# Patient Record
Sex: Female | Born: 1940 | Race: White | Hispanic: No | Smoking: Former smoker
Health system: Southern US, Community
[De-identification: ages and names within clinical notes are randomized; demographics above are authoritative.]

## PROBLEM LIST (undated history)

## (undated) DIAGNOSIS — F329 Major depressive disorder, single episode, unspecified: Secondary | ICD-10-CM

## (undated) DIAGNOSIS — M1990 Unspecified osteoarthritis, unspecified site: Secondary | ICD-10-CM

## (undated) DIAGNOSIS — H269 Unspecified cataract: Secondary | ICD-10-CM

## (undated) DIAGNOSIS — E079 Disorder of thyroid, unspecified: Secondary | ICD-10-CM

## (undated) DIAGNOSIS — J449 Chronic obstructive pulmonary disease, unspecified: Secondary | ICD-10-CM

## (undated) DIAGNOSIS — M810 Age-related osteoporosis without current pathological fracture: Secondary | ICD-10-CM

## (undated) DIAGNOSIS — F419 Anxiety disorder, unspecified: Secondary | ICD-10-CM

## (undated) DIAGNOSIS — J439 Emphysema, unspecified: Secondary | ICD-10-CM

## (undated) DIAGNOSIS — F32A Depression, unspecified: Secondary | ICD-10-CM

## (undated) DIAGNOSIS — Z87440 Personal history of urinary (tract) infections: Secondary | ICD-10-CM

## (undated) HISTORY — DX: Unspecified cataract: H26.9

## (undated) HISTORY — DX: Unspecified osteoarthritis, unspecified site: M19.90

## (undated) HISTORY — DX: Disorder of thyroid, unspecified: E07.9

## (undated) HISTORY — DX: Age-related osteoporosis without current pathological fracture: M81.0

## (undated) HISTORY — DX: Personal history of urinary (tract) infections: Z87.440

## (undated) HISTORY — DX: Major depressive disorder, single episode, unspecified: F32.9

## (undated) HISTORY — DX: Emphysema, unspecified: J43.9

## (undated) HISTORY — DX: Chronic obstructive pulmonary disease, unspecified: J44.9

## (undated) HISTORY — PX: ABDOMINAL HYSTERECTOMY: SHX81

## (undated) HISTORY — DX: Depression, unspecified: F32.A

## (undated) HISTORY — DX: Anxiety disorder, unspecified: F41.9

---

## 2010-09-23 ENCOUNTER — Encounter: Payer: Self-pay | Admitting: Cardiovascular Disease

## 2010-09-30 DIAGNOSIS — J45909 Unspecified asthma, uncomplicated: Secondary | ICD-10-CM | POA: Insufficient documentation

## 2010-09-30 DIAGNOSIS — R079 Chest pain, unspecified: Secondary | ICD-10-CM | POA: Insufficient documentation

## 2010-09-30 DIAGNOSIS — J40 Bronchitis, not specified as acute or chronic: Secondary | ICD-10-CM | POA: Insufficient documentation

## 2010-09-30 DIAGNOSIS — K649 Unspecified hemorrhoids: Secondary | ICD-10-CM | POA: Insufficient documentation

## 2010-09-30 DIAGNOSIS — F32A Depression, unspecified: Secondary | ICD-10-CM | POA: Insufficient documentation

## 2010-09-30 DIAGNOSIS — F419 Anxiety disorder, unspecified: Secondary | ICD-10-CM | POA: Insufficient documentation

## 2010-09-30 DIAGNOSIS — Z8639 Personal history of other endocrine, nutritional and metabolic disease: Secondary | ICD-10-CM | POA: Insufficient documentation

## 2010-09-30 DIAGNOSIS — J439 Emphysema, unspecified: Secondary | ICD-10-CM | POA: Insufficient documentation

## 2010-09-30 DIAGNOSIS — R0602 Shortness of breath: Secondary | ICD-10-CM | POA: Insufficient documentation

## 2010-09-30 DIAGNOSIS — F329 Major depressive disorder, single episode, unspecified: Secondary | ICD-10-CM | POA: Insufficient documentation

## 2010-09-30 DIAGNOSIS — M810 Age-related osteoporosis without current pathological fracture: Secondary | ICD-10-CM | POA: Insufficient documentation

## 2010-10-01 ENCOUNTER — Encounter: Payer: Self-pay | Admitting: Cardiovascular Disease

## 2010-10-01 ENCOUNTER — Ambulatory Visit
Admission: RE | Admit: 2010-10-01 | Discharge: 2010-10-01 | Payer: Self-pay | Source: Home / Self Care | Attending: Cardiovascular Disease | Admitting: Cardiovascular Disease

## 2010-10-01 DIAGNOSIS — I491 Atrial premature depolarization: Secondary | ICD-10-CM | POA: Insufficient documentation

## 2010-10-05 ENCOUNTER — Ambulatory Visit: Payer: Self-pay | Admitting: Cardiology

## 2010-10-15 NOTE — Letter (Signed)
Summary: [facility]  [facility]   Imported By: Marylou Mccoy 09/30/2010 14:41:49  _____________________________________________________________________  External Attachment:    Type:   Image     Comment:   External Document

## 2010-10-15 NOTE — Assessment & Plan Note (Signed)
Summary: chest pain/sob/copd/mt   CC:  sob.  History of Present Illness: Rachel Perez is referred today by urgent care for SOB , PAC;s and chest pressure.  She is a long term smoker and clinically has severe COPD.  She indicates some sort of lung test at Dr Doolittle's office last week that indicated mod-upper level disease.  Chest pressure sounds more like congestion from COPD/URI and impoved with Zpack.  Chronic cough.  Complains of LE pain and has not been dancing for 2-3 years.  No previous CAD, PVD or known vascular disease.  Very limited in activity by exertinal dyspnea.  2 episodes recently of very bad dyspnea associated with pressure and anxiety.  No resting dsypnea or pressure  Current Problems (verified): 1)  Chest Pain  (ICD-786.50) 2)  Hemorrhoids  (ICD-455.6) 3)  Thyroid Disorder  (ICD-246.9) 4)  Asthma  (ICD-493.90) 5)  Bronchitis  (ICD-490) 6)  Depression  (ICD-311) 7)  Anxiety  (ICD-300.00) 8)  Osteoporosis  (ICD-733.00) 9)  COPD  (ICD-496) 10)  Shortness of Breath  (ICD-786.05)  Current Medications (verified): 1)  Calcium 500 Mg Tabs (Calcium) .Marland Kitchen.. 1 Tab By Mouth Three Times A Day 2)  Spiriva Handihaler 18 Mcg Caps (Tiotropium Bromide Monohydrate) .... As Directed 3)  Advair Diskus 500-50 Mcg/dose Aepb (Fluticasone-Salmeterol) .... As Directed 4)  Alprazolam .... As Needed 5)  Wellbutrin Xl 300 Mg Xr24h-Tab (Bupropion Hcl) .Marland Kitchen.. 1 Tab By Mouth Once Daily 6)  Vitamin D 2000 Unit Tabs (Cholecalciferol) .Marland Kitchen.. 1 Tab By Mouth Once Daily 7)  Fluticasone Propionate 50 Mcg/act Susp (Fluticasone Propionate) .... As Directed 8)  Proair Hfa 108 (90 Base) Mcg/act Aers (Albuterol Sulfate) .... As Directed 9)  Fosamax 40 Mg Tabs (Alendronate Sodium) .Marland Kitchen.. 1 Tab By Mouth Weekly 10)  Vitamin D (Ergocalciferol) 50000 Unit Caps (Ergocalciferol) .... Weekly 11)  Mucinex .... As Needed  Allergies (verified): 1)  ! Pcn  Past History:  Past Medical History: Last updated: 09/30/2010 CHEST  PAIN  HEMORRHOIDS  THYROID DISORDER ASTHMA0 BRONCHITIS  DEPRESSION ANXIETY OSTEOPOROSIS COPD SHORTNESS OF BREATH  Past Surgical History: Last updated: 09/30/2010 Tonsilis/ Adenoids  Hysterctomy  Family History: Last updated: 09/30/2010 asthma stroke  Social History: Last updated: 09/30/2010 Divorced  Tobacco Use - No.  Alcohol Use - yes Regular Exercise - yes Drug Use - no  Review of Systems       Denies fever, malais, weight loss, blurry vision, decreased visual acuity, cough, sputum,hemoptysis, pleuritic pain, palpitaitons, heartburn, abdominal pain, melena, lower extremity edema, claudication, or rash.   Vital Signs:  Patient profile:   70 year old female Height:      67 inches Weight:      168 pounds BMI:     26.41 Pulse rate:   90 / minute Resp:     16 per minute BP sitting:   124 / 72  (left arm)  Vitals Entered By: Kem Parkinson (October 01, 2010 2:57 PM)  Physical Exam  General:  Affect appropriate Healthy:  appears stated age HEENT: normal Neck supple with no adenopathy JVP normal no bruits no thyromegaly Lungs  interstitial crackles right greater than left with end expitory wheezing and good diaphragmatic motion Heart:  S1/S2 no murmur,rub, gallop or click PMI normal Abdomen: benighn, BS positve, no tenderness, no AAA no bruit.  No HSM or HJR Distal pulses intact with no bruits No edema Neuro non-focal Skin warm and dry    Impression & Recommendations:  Problem # 1:  CHEST PAIN (ICD-786.50) Likely  related to COPD.  ECG benign.  Dobutuamine echo Orders: Dobutamine Echo (Dobutamine Echo)  Problem # 2:  SHORTNESS OF BREATH (ICD-786.05) Advanced COPD.  R/O ILD  Refer to pulmonary  CT to assess for interstitial fibrosis  Echo to assess RV/LV function R/O pulmonary HTN Orders: CT Scan  (CT Scan) Pulmonary Referral (Pulmonary) Echocardiogram (Echo)  Problem # 3:  PAC (ICD-427.61) Benign no evidence of MAT.  See what echo and  dobutamine look like  Patient Instructions: 1)  You have been referred to PULMONARY FOR COPD 2)  Non-Cardiac CT scanning, (CAT scanning), is a noninvasive, special x-ray that produces cross-sectional images of the body using x-rays and a computer. CT scans help physicians diagnose and treat medical conditions. For some CT exams, a contrast material is used to enhance visibility in the area of the body being studied. CT scans provide greater clarity and reveal more details than regular x-ray exams. HIGH RESOLUTION FOR INTERSTITUAL LUNG DISEASE 3)  Your physician has requested that you have a dobutamine echocardiogram.  For further information please visit [email or web address]  Please follow instruction sheet as given. 4)  Your physician has requested that you have an echocardiogram.  Echocardiography is a painless test that uses sound waves to create images of your heart. It provides your doctor with information about the size and shape of your heart and how well your heart's chambers and valves are working.  This procedure takes approximately one hour. There are no restrictions for this procedure.   Echocardiogram Report  Procedure date:  10/01/2010  EKG Report  Procedure date:  10/01/2010  Findings:      NSR  PAC No ishcemia or inarct

## 2010-10-19 ENCOUNTER — Telehealth (INDEPENDENT_AMBULATORY_CARE_PROVIDER_SITE_OTHER): Payer: Self-pay | Admitting: *Deleted

## 2010-10-20 ENCOUNTER — Encounter: Payer: Self-pay | Admitting: Cardiovascular Disease

## 2010-10-20 ENCOUNTER — Ambulatory Visit (HOSPITAL_BASED_OUTPATIENT_CLINIC_OR_DEPARTMENT_OTHER): Payer: MEDICARE

## 2010-10-20 ENCOUNTER — Ambulatory Visit (HOSPITAL_COMMUNITY): Payer: MEDICARE | Attending: Cardiovascular Disease

## 2010-10-20 DIAGNOSIS — R0989 Other specified symptoms and signs involving the circulatory and respiratory systems: Secondary | ICD-10-CM

## 2010-10-20 DIAGNOSIS — I080 Rheumatic disorders of both mitral and aortic valves: Secondary | ICD-10-CM | POA: Insufficient documentation

## 2010-10-20 DIAGNOSIS — I079 Rheumatic tricuspid valve disease, unspecified: Secondary | ICD-10-CM | POA: Insufficient documentation

## 2010-10-20 DIAGNOSIS — R072 Precordial pain: Secondary | ICD-10-CM

## 2010-10-20 DIAGNOSIS — I379 Nonrheumatic pulmonary valve disorder, unspecified: Secondary | ICD-10-CM | POA: Insufficient documentation

## 2010-10-20 DIAGNOSIS — R0602 Shortness of breath: Secondary | ICD-10-CM

## 2010-10-23 ENCOUNTER — Institutional Professional Consult (permissible substitution) (INDEPENDENT_AMBULATORY_CARE_PROVIDER_SITE_OTHER): Payer: MEDICARE | Admitting: Pulmonary Disease

## 2010-10-23 ENCOUNTER — Encounter: Payer: Self-pay | Admitting: Pulmonary Disease

## 2010-10-23 DIAGNOSIS — R0602 Shortness of breath: Secondary | ICD-10-CM

## 2010-10-29 NOTE — Progress Notes (Signed)
Summary: nuc pre procedure  Phone Note Outgoing Call Call back at Home Phone [phone]   Call placed by: Cathlyn Parsons RN,  October 19, 2010 2:49 PM Call placed to: Patient Reason for Call: Confirm/change Appt Summary of Call: Left message with information on [employer] Information Sheet (see scanned document for details).

## 2010-11-10 NOTE — Assessment & Plan Note (Signed)
Summary: consult for doe   Copy to:  Charlton Haws Primary Provider/Referring Provider:  Justice Britain. Merla Riches  CC:  shortness of breath.  History of Present Illness: The pt is a 70y/o female who I have been asked to see for dyspnea.  She has a h/o longstanding doe, but feels it is getting worse.  1-2 block doe at moderate pace, + with groceries from car, - sweeping one room of her home.  Intermittant cough with scant clear mucus.  No LE edema. Recent cxr and ct chest with emphysematous changes, o/w negative.  Has had spiro in distant past, not available currently.  Recent echo with suggestion of diast dysfxn with mildly dilated LA, no pulm htn.  Stress echo neg.  Currently on advair and spiriva.  Preventive Screening-Counseling & Management  Alcohol-Tobacco     Smoking Status: quit < 6 months     Smoking Cessation Counseling: yes     Tobacco Counseling: to remain off tobacco products  Current Medications (verified): 1)  Calcium 500 Mg Tabs (Calcium) .Marland Kitchen.. 1 Tab By Mouth Three Times A Day 2)  Spiriva Handihaler 18 Mcg Caps (Tiotropium Bromide Monohydrate) .... As Directed 3)  Advair Diskus 500-50 Mcg/dose Aepb (Fluticasone-Salmeterol) .... Inhale 1 Puff Two Times A Day 4)  Alprazolam .... As Needed 5)  Wellbutrin Xl 300 Mg Xr24h-Tab (Bupropion Hcl) .Marland Kitchen.. 1 Tab By Mouth Once Daily 6)  Vitamin D 2000 Unit Tabs (Cholecalciferol) .Marland Kitchen.. 1 Tab By Mouth Once Daily 7)  Fluticasone Propionate 50 Mcg/act Susp (Fluticasone Propionate) .... 2 Sprays in Each Nostril Once Daily 8)  Proair Hfa 108 (90 Base) Mcg/act Aers (Albuterol Sulfate) .... As Directed 9)  Fosamax 40 Mg Tabs (Alendronate Sodium) .Marland Kitchen.. 1 Tab By Mouth Weekly 10)  Vitamin D (Ergocalciferol) 50000 Unit Caps (Ergocalciferol) .... Weekly 11)  Mucinex .... As Needed  Allergies (verified): 1)  ! Pcn  Past History:  Past Medical History: CHEST PAIN  HEMORRHOIDS  THYROID DISOder DEPRESSION ANXIETY OSTEOPOROSIS  Past Surgical  History: Tonsilis/ Adenoids  Hysterctomy  1976  Family History: Reviewed history from 09/30/2010 and no changes required. asthma: mother rheumatism: mgm cancer: mgm (ovarian)   Social History: Reviewed history from 09/30/2010 and no changes required. Divorced  lives alone. has children. Tobacco Use - current smoker.  started at age 43.  stopped DEC 2011 Alcohol Use - yes Regular Exercise - yes Drug Use - no Smoking Status:  quit < 6 months  Review of Systems       The patient complains of shortness of breath with activity, productive cough, non-productive cough, difficulty swallowing, nasal congestion/difficulty breathing through nose, and depression.  The patient denies shortness of breath at rest, coughing up blood, chest pain, irregular heartbeats, acid heartburn, indigestion, loss of appetite, weight change, abdominal pain, sore throat, tooth/dental problems, headaches, sneezing, itching, ear ache, anxiety, hand/feet swelling, joint stiffness or pain, rash, change in color of mucus, and fever.    Vital Signs:  Patient profile:   70 year old female Height:      67 inches Weight:      169 pounds BMI:     26.56 O2 Sat:      92 % on Room air Temp:     97.7 degrees F oral Pulse rate:   97 / minute BP sitting:   130 / 62  (left arm) Cuff size:   regular  Vitals Entered By: Arman Filter LPN (October 23, 2010 2:22 PM)  O2 Flow:  Room air  Serial Vital Signs/Assessments:  Comments: Ambulatory Pulse Oximetry  Resting; HR__78___    02 Sat___96__  Lap1 (185 feet)   HR_90____   02 Sat__93___ Lap2 (185 feet)   HR___91__   02 Sat__92___    Lap3 (185 feet)   HR__95___   02 Sat__92___  __x_Test Completed without Difficulty ___Test Stopped due to:   By: Carron Curie CMA   CC: shortness of breath Comments Medications reviewed with patient Arman Filter LPN  October 23, 2010 2:33 PM    Physical Exam  General:  wd female in nad Eyes:  PERRLA and EOMI.     Nose:  mild crusting and bleeding noted. Mouth:  clear, no exudates or lesions seen  Neck:  no  Lungs:  decreased bs throughout, no wheezing or rhonchi  Heart:  rrr, no mrg Abdomen:  soft and nontender, bs+ Extremities:  no significant edema or cyanosis pulses intact distally Neurologic:  alert and oriented, moves all 4.   Impression & Recommendations:  Problem # 1:  SHORTNESS OF BREATH (ICD-786.05) the pt has doe that may be multifactorial, but need to evaluate for chronic obstructive lung disease.  Emphysematous changes on xray do not always result in obstructive airway physiology.  Will need full pfts for evaluation, and in the interim asked her to continue on some type of bronchodilator regimen.  I also stressed to her the need to stay away from cigarettes.  Will see her back after pfts to discuss results.  Of note, she did not desaturate today with brisk ambulation.  Medications Added to Medication List This Visit: 1)  Advair Diskus 500-50 Mcg/dose Aepb (Fluticasone-salmeterol) .... Inhale 1 puff two times a day 2)  Fluticasone Propionate 50 Mcg/act Susp (Fluticasone propionate) .... 2 sprays in each nostril once daily  Other Orders: Consultation Level IV (16109) Pulmonary Referral (Pulmonary) Pulse Oximetry, Ambulatory (60454) Tobacco use cessation intermediate 3-10 minutes (09811)  Patient Instructions: 1)  will schedule for breathing studies, and see you back on same day for review 2)  stop advair.  In its place take symbicort 160/4.5  2 puffs in am and pm everyday....rinse mouth well 3)  stay on spiriva one inhalation each am

## 2010-11-13 ENCOUNTER — Encounter: Payer: Self-pay | Admitting: Pulmonary Disease

## 2010-11-13 ENCOUNTER — Encounter (INDEPENDENT_AMBULATORY_CARE_PROVIDER_SITE_OTHER): Payer: MEDICARE

## 2010-11-13 ENCOUNTER — Ambulatory Visit (INDEPENDENT_AMBULATORY_CARE_PROVIDER_SITE_OTHER): Payer: MEDICARE | Admitting: Pulmonary Disease

## 2010-11-13 DIAGNOSIS — J449 Chronic obstructive pulmonary disease, unspecified: Secondary | ICD-10-CM

## 2010-11-13 DIAGNOSIS — J438 Other emphysema: Secondary | ICD-10-CM

## 2010-11-19 NOTE — Assessment & Plan Note (Addendum)
Summary: rov for review of pfts   Copy to:  Charlton Haws Primary Provider/Referring Provider:  Karrie Meres  CC:  Ov to discuss PFT results.  Pt states she hasn't noticed a change in her breathing since switching from Advair to Symbicort.   Productive cough with clear sputum, nasal congestion with clear nasal drainage, and and headaches. .  History of Present Illness: The pt comes in today for f/u after her pfts.  She was found to have severe airflow obstruction, but a significant improvment with bronchodilators.  She had no restriction, but significant airtrapping.  Her dlco was moderately reduced.  I have reviewed the study with her in detail, and answered all of her questions.  Current Medications (verified): 1)  Calcium 500 Mg Tabs (Calcium) .Marland Kitchen.. 1 Tab By Mouth Three Times A Day 2)  Spiriva Handihaler 18 Mcg Caps (Tiotropium Bromide Monohydrate) .... As Directed 3)  Symbicort 160-4.5 Mcg/act  Aero (Budesonide-Formoterol Fumarate) .... Two Puffs Twice Daily 4)  Alprazolam .... As Needed 5)  Wellbutrin Xl 300 Mg Xr24h-Tab (Bupropion Hcl) .Marland Kitchen.. 1 Tab By Mouth Once Daily 6)  Vitamin D 2000 Unit Tabs (Cholecalciferol) .Marland Kitchen.. 1 Tab By Mouth Once Daily 7)  Fluticasone Propionate 50 Mcg/act Susp (Fluticasone Propionate) .... 2 Sprays in Each Nostril Once Daily 8)  Proair Hfa 108 (90 Base) Mcg/act Aers (Albuterol Sulfate) .... As Directed 9)  Fosamax 40 Mg Tabs (Alendronate Sodium) .Marland Kitchen.. 1 Tab By Mouth Weekly 10)  Vitamin D (Ergocalciferol) 50000 Unit Caps (Ergocalciferol) .... Weekly  Allergies (verified): 1)  ! Pcn  Review of Systems       The patient complains of shortness of breath with activity, productive cough, non-productive cough, sore throat, headaches, and nasal congestion/difficulty breathing through nose.  The patient denies shortness of breath at rest, coughing up blood, chest pain, irregular heartbeats, acid heartburn, indigestion, loss of appetite, weight change,  abdominal pain, difficulty swallowing, tooth/dental problems, sneezing, itching, ear ache, anxiety, depression, hand/feet swelling, joint stiffness or pain, rash, change in color of mucus, and fever.    Vital Signs:  Patient profile:   70 year old female Height:      67 inches Weight:      174 pounds BMI:     27.35 O2 Sat:      93 % on Room air Pulse rate:   97 / minute BP sitting:   144 / 82  (right arm) Cuff size:   regular  Vitals Entered By: Arman Filter LPN (November 12, 5641 12:01 PM)  O2 Flow:  Room air CC: Ov to discuss PFT results.  Pt states she hasn't noticed a change in her breathing since switching from Advair to Symbicort.   Productive cough with clear sputum, nasal congestion with clear nasal drainage, and headaches.  Comments Medications reviewed with patient Arman Filter LPN  November 12, 3293 12:04 PM    Physical Exam  General:  ow female in nad  Extremities:  no significant edema or cyanosis    Impression & Recommendations:  Problem # 1:  EMPHYSEMA (ICD-492.8) the pt has severe emphysema by her pfts today, but there is some degree of reversibility noted.  I also think once she has been away from cigarettes for a few months, she may see further improvement.  The focus at this point should be on maximizing QOL.  She is already on a good bronchodilator regimen, but would benefit from having albuterol by nebulization available for her "bad days".  She  does not require oxygen.  Other than total smoking cessation, pulmonary rehab is another key for her.  She has agreed to participate.  Time spent today reviewing pathophysiology and treatment for emphysema was   Medications Added to Medication List This Visit: 1)  Symbicort 160-4.5 Mcg/act Aero (Budesonide-formoterol fumarate) .... Two puffs twice daily  Other Orders: Est. Patient Level III (25956) Rehabilitation Referral (Rehab) DME Referral (DME)  Patient Instructions: 1)  stay on symbicort and spiriva.Marland Kitchenkeep  mouth rinsed well. 2)  stay as active as possible.  Will refer to pulmonary rehab at [facility]. 3)  trial of stopping flonase nasal spray, and using nasal saline spray instead. 4)  stay away from smoking. 5)  followup with me in 4mos   Prescriptions: SYMBICORT 160-4.5 MCG/ACT  AERO (BUDESONIDE-FORMOTEROL FUMARATE) Two puffs twice daily  #1 x 6   Entered and Authorized by:   Barbaraann Share MD   Signed by:   Barbaraann Share MD on 11/13/2010   Method used:   Print then Give to Patient   RxID:   [id number]

## 2010-11-24 NOTE — Assessment & Plan Note (Signed)
Summary: pft charges   Allergies: 1)  ! Pcn   Other Orders: Carbon Monoxide diffusing w/capacity (81191) Lung Volumes/Gas dilution or washout (47829) Spirometry (Pre & Post) [phone])

## 2011-08-25 ENCOUNTER — Other Ambulatory Visit: Payer: Self-pay | Admitting: Pulmonary Disease

## 2011-10-18 ENCOUNTER — Other Ambulatory Visit: Payer: Self-pay | Admitting: Physician Assistant

## 2011-10-23 ENCOUNTER — Other Ambulatory Visit: Payer: Self-pay | Admitting: Physician Assistant

## 2011-11-03 ENCOUNTER — Ambulatory Visit (INDEPENDENT_AMBULATORY_CARE_PROVIDER_SITE_OTHER): Payer: Medicare Other | Admitting: Internal Medicine

## 2011-11-03 ENCOUNTER — Encounter: Payer: Self-pay | Admitting: Physician Assistant

## 2011-11-03 VITALS — BP 118/70 | HR 77 | Temp 96.8°F | Resp 16 | Ht 67.5 in | Wt 175.6 lb

## 2011-11-03 DIAGNOSIS — F418 Other specified anxiety disorders: Secondary | ICD-10-CM

## 2011-11-03 DIAGNOSIS — R8281 Pyuria: Secondary | ICD-10-CM

## 2011-11-03 DIAGNOSIS — F419 Anxiety disorder, unspecified: Secondary | ICD-10-CM

## 2011-11-03 DIAGNOSIS — Z0000 Encounter for general adult medical examination without abnormal findings: Secondary | ICD-10-CM

## 2011-11-03 DIAGNOSIS — M810 Age-related osteoporosis without current pathological fracture: Secondary | ICD-10-CM

## 2011-11-03 DIAGNOSIS — Z23 Encounter for immunization: Secondary | ICD-10-CM

## 2011-11-03 DIAGNOSIS — R238 Other skin changes: Secondary | ICD-10-CM

## 2011-11-03 DIAGNOSIS — L853 Xerosis cutis: Secondary | ICD-10-CM

## 2011-11-03 DIAGNOSIS — J449 Chronic obstructive pulmonary disease, unspecified: Secondary | ICD-10-CM

## 2011-11-03 LAB — POCT UA - MICROSCOPIC ONLY
Casts, Ur, LPF, POC: NEGATIVE
Crystals, Ur, HPF, POC: NEGATIVE
Mucus, UA: NEGATIVE
Yeast, UA: NEGATIVE

## 2011-11-03 LAB — POCT URINALYSIS DIPSTICK
Ketones, UA: NEGATIVE
Protein, UA: NEGATIVE
Spec Grav, UA: 1.025
Urobilinogen, UA: 0.2
pH, UA: 5

## 2011-11-03 LAB — IFOBT (OCCULT BLOOD): IFOBT: NEGATIVE

## 2011-11-03 MED ORDER — ALPRAZOLAM 0.5 MG PO TABS: 0.5000 mg | ORAL_TABLET | Freq: Every evening | ORAL | Status: DC | PRN

## 2011-11-03 MED ORDER — TIOTROPIUM BROMIDE MONOHYDRATE 18 MCG IN CAPS: 18.0000 ug | ORAL_CAPSULE | Freq: Every day | RESPIRATORY_TRACT | Status: DC

## 2011-11-03 MED ORDER — TRIAMCINOLONE ACETONIDE 0.05 % EX OINT: 1.0000 "application " | TOPICAL_OINTMENT | Freq: Two times a day (BID) | CUTANEOUS | Status: DC

## 2011-11-03 MED ORDER — ALENDRONATE SODIUM 70 MG PO TABS: 70.0000 mg | ORAL_TABLET | ORAL | Status: DC

## 2011-11-03 MED ORDER — BUPROPION HCL ER (XL) 150 MG PO TB24: 150.0000 mg | ORAL_TABLET | Freq: Every day | ORAL | Status: DC

## 2011-11-03 MED ORDER — ALBUTEROL SULFATE HFA 108 (90 BASE) MCG/ACT IN AERS
2.0000 | INHALATION_SPRAY | Freq: Four times a day (QID) | RESPIRATORY_TRACT | Status: DC | PRN
Start: 2011-11-03 — End: 2012-12-15

## 2011-11-03 MED ORDER — BUDESONIDE-FORMOTEROL FUMARATE 160-4.5 MCG/ACT IN AERO: 2.0000 | INHALATION_SPRAY | Freq: Two times a day (BID) | RESPIRATORY_TRACT | Status: DC

## 2011-11-03 NOTE — Patient Instructions (Signed)

## 2011-11-03 NOTE — Progress Notes (Signed)
Subjective:    Patient ID: Rachel Perez, female    DOB: 1941/07/14, 71 y.o.   MRN: [id number]  HPI  Pt presents for CPE.  Doing really well.  Has had a good year, a lot of traveling but overall feels healthy.  Has seen Dr. Eden Emms with good results.  Dr Shelle Iron states lungs are as good as we can get, he did suggest pulmonary rehab but pt states to expensive and has started to walk everyday and that has significantly helped her SOB and lung function.  Looking forward to the spring to work in her garden.  She is having problems with her Wellbutrin causing heartburn and stomach upset and her depression is well controlled.  She is experiencing dry skin on arms, chest and back.    Review of Systems  Constitutional: Negative.   HENT: Negative.   Eyes: Negative.   Respiratory: Positive for cough, shortness of breath (no more than normal) and wheezing (no more than normal).   Cardiovascular: Negative.   Gastrointestinal: Positive for nausea (only with wellbutrin dosing).  Genitourinary: Negative.   Musculoskeletal: Positive for back pain (less than nl - only problems with standing over skin for a long period of time).  Neurological: Negative.   Hematological: Negative.   Psychiatric/Behavioral: Negative.        Objective:   Physical Exam  Constitutional: She is oriented to person, place, and time. Vital signs are normal. She appears well-developed and well-nourished.  HENT:  Head: Normocephalic and atraumatic.  Right Ear: Hearing, tympanic membrane, external ear and ear canal normal.  Left Ear: Hearing, tympanic membrane, external ear and ear canal normal.  Nose: Nose normal.  Mouth/Throat: Uvula is midline and oropharynx is clear and moist.  Eyes: Conjunctivae, EOM and lids are normal. Pupils are equal, round, and reactive to light.  Neck: Normal range of motion. Neck supple. No mass and no thyromegaly present.  Cardiovascular: Normal rate, regular rhythm and normal heart sounds.   No  murmur heard. Pulmonary/Chest: Effort normal. She has wheezes (bases - nl for pt).  Abdominal: Soft. Bowel sounds are normal.  Musculoskeletal: Normal range of motion.  Lymphadenopathy:    She has no cervical adenopathy.  Neurological: She is alert and oriented to person, place, and time.  Skin: Skin is warm and dry. Rash (dry skin arms, back) noted. She is not diaphoretic.  Psychiatric: She has a normal mood and affect. Her behavior is normal. Judgment and thought content normal.    Examined patient with Herma Ard PA and agree with A&P/ RPD      Assessment & Plan:   1. Annual physical exam  CBC with Differential, Comprehensive metabolic panel, Lipid panel, TSH, IFOBT POC (occult bld, rslt in office), POCT urinalysis dipstick, Flu vaccine greater than or equal to 3yo preservative free IM  2. COPD (chronic obstructive pulmonary disease)  budesonide-formoterol (SYMBICORT) 160-4.5 MCG/ACT inhaler, tiotropium (SPIRIVA HANDIHALER) 18 MCG inhalation capsule, albuterol (PROAIR HFA) 108 (90 BASE) MCG/ACT inhaler, Flu vaccine greater than or equal to 3yo preservative free IM  3. Osteoporosis  alendronate (FOSAMAX) 70 MG tablet, TSH, Vitamin D, 25-hydroxy  4. Anxiety  ALPRAZolam (XANAX) 0.5 MG tablet, buPROPion (WELLBUTRIN XL) 150 MG 24 hr tablet  5. Dry skin  TRIAMCINOLONE ACETONIDE, TOP, 0.05 % OINT  6. Pyuria  POCT UA - Microscopic Only, Urine culture  7. Depression with anxiety      Gave anticipatory guidance.  Pt to set up mammogram and bone density. Gave flu vaccine today.  Pt to continue exercise and healthly lifestyle.  She cheats some with smoking but very rarely but she still craves them.  Can call for RF of xanax in the next year.  Will plan on her seeing [place] in a year depending on her lab results.

## 2011-11-04 LAB — COMPREHENSIVE METABOLIC PANEL
ALT: 14 U/L (ref 0–35)
AST: 15 U/L (ref 0–37)
Albumin: 4.3 g/dL (ref 3.5–5.2)
Alkaline Phosphatase: 88 U/L (ref 39–117)
Calcium: 9.2 mg/dL (ref 8.4–10.5)
Chloride: 105 mEq/L (ref 96–112)
Potassium: 4.7 mEq/L (ref 3.5–5.3)

## 2011-11-04 LAB — CBC WITH DIFFERENTIAL/PLATELET
Basophils Absolute: 0.1 10*3/uL (ref 0.0–0.1)
Basophils Relative: 1 % (ref 0–1)
Lymphocytes Relative: 27 % (ref 12–46)
Neutro Abs: 6.1 10*3/uL (ref 1.7–7.7)
Neutrophils Relative %: 64 % (ref 43–77)
Platelets: 292 10*3/uL (ref 150–400)
RDW: 12.6 % (ref 11.5–15.5)
WBC: 9.6 10*3/uL (ref 4.0–10.5)

## 2011-11-04 LAB — LIPID PANEL
LDL Cholesterol: 145 mg/dL — ABNORMAL HIGH (ref 0–99)
Total CHOL/HDL Ratio: 3.8 Ratio

## 2011-11-04 LAB — TSH: TSH: 2.667 u[IU]/mL (ref 0.350–4.500)

## 2011-11-05 LAB — URINE CULTURE: Colony Count: 6000

## 2012-11-21 ENCOUNTER — Telehealth: Payer: Self-pay

## 2012-11-21 NOTE — Telephone Encounter (Signed)
error 

## 2012-11-30 ENCOUNTER — Other Ambulatory Visit: Payer: Self-pay | Admitting: Physician Assistant

## 2012-12-13 ENCOUNTER — Encounter: Payer: Self-pay | Admitting: Physician Assistant

## 2012-12-13 ENCOUNTER — Ambulatory Visit (INDEPENDENT_AMBULATORY_CARE_PROVIDER_SITE_OTHER): Payer: Medicare Other | Admitting: Internal Medicine

## 2012-12-13 VITALS — BP 136/61 | HR 85 | Temp 98.2°F | Resp 18 | Ht 67.0 in | Wt 166.8 lb

## 2012-12-13 DIAGNOSIS — J449 Chronic obstructive pulmonary disease, unspecified: Secondary | ICD-10-CM

## 2012-12-13 DIAGNOSIS — N390 Urinary tract infection, site not specified: Secondary | ICD-10-CM

## 2012-12-13 DIAGNOSIS — Z0000 Encounter for general adult medical examination without abnormal findings: Secondary | ICD-10-CM

## 2012-12-13 DIAGNOSIS — F411 Generalized anxiety disorder: Secondary | ICD-10-CM

## 2012-12-13 DIAGNOSIS — R300 Dysuria: Secondary | ICD-10-CM

## 2012-12-13 DIAGNOSIS — L853 Xerosis cutis: Secondary | ICD-10-CM

## 2012-12-13 DIAGNOSIS — Z139 Encounter for screening, unspecified: Secondary | ICD-10-CM

## 2012-12-13 DIAGNOSIS — M810 Age-related osteoporosis without current pathological fracture: Secondary | ICD-10-CM

## 2012-12-13 DIAGNOSIS — F419 Anxiety disorder, unspecified: Secondary | ICD-10-CM

## 2012-12-13 DIAGNOSIS — L738 Other specified follicular disorders: Secondary | ICD-10-CM

## 2012-12-13 LAB — COMPREHENSIVE METABOLIC PANEL
ALT: 11 U/L (ref 0–35)
CO2: 26 mEq/L (ref 19–32)
Calcium: 9.1 mg/dL (ref 8.4–10.5)
Chloride: 105 mEq/L (ref 96–112)
Creat: 0.91 mg/dL (ref 0.50–1.10)
Glucose, Bld: 91 mg/dL (ref 70–99)
Total Bilirubin: 0.4 mg/dL (ref 0.3–1.2)
Total Protein: 6.7 g/dL (ref 6.0–8.3)

## 2012-12-13 LAB — POCT URINALYSIS DIPSTICK
Bilirubin, UA: NEGATIVE
Glucose, UA: NEGATIVE
Nitrite, UA: NEGATIVE
Urobilinogen, UA: 0.2
pH, UA: 5.5

## 2012-12-13 LAB — CBC
MCHC: 34.2 g/dL (ref 30.0–36.0)
Platelets: 244 10*3/uL (ref 150–400)
RDW: 13.8 % (ref 11.5–15.5)
WBC: 8 10*3/uL (ref 4.0–10.5)

## 2012-12-13 LAB — POCT UA - MICROSCOPIC ONLY
Casts, Ur, LPF, POC: NEGATIVE
Crystals, Ur, HPF, POC: NEGATIVE
Epithelial cells, urine per micros: NEGATIVE
Yeast, UA: NEGATIVE

## 2012-12-13 LAB — LIPID PANEL
HDL: 66 mg/dL (ref >39)
Triglycerides: 79 mg/dL (ref <150)

## 2012-12-13 LAB — TSH: TSH: 2.542 u[IU]/mL (ref 0.350–4.500)

## 2012-12-13 MED ORDER — NITROFURANTOIN MONOHYD MACRO 100 MG PO CAPS: 100.0000 mg | ORAL_CAPSULE | Freq: Two times a day (BID) | ORAL | Status: DC

## 2012-12-13 MED ORDER — IPRATROPIUM-ALBUTEROL 0.5-2.5 (3) MG/3ML IN SOLN: 3.0000 mL | Freq: Two times a day (BID) | RESPIRATORY_TRACT | Status: DC

## 2012-12-13 MED ORDER — ALPRAZOLAM 0.5 MG PO TABS: 0.5000 mg | ORAL_TABLET | Freq: Every evening | ORAL | Status: DC | PRN

## 2012-12-13 MED ORDER — TRIAMCINOLONE ACETONIDE 0.1 % EX CREA: TOPICAL_CREAM | Freq: Two times a day (BID) | CUTANEOUS | Status: DC

## 2012-12-13 MED ORDER — ALENDRONATE SODIUM 70 MG PO TABS: 70.0000 mg | ORAL_TABLET | ORAL | Status: DC

## 2012-12-13 MED ORDER — TIOTROPIUM BROMIDE MONOHYDRATE 18 MCG IN CAPS: 18.0000 ug | ORAL_CAPSULE | Freq: Every day | RESPIRATORY_TRACT | Status: DC

## 2012-12-13 MED ORDER — BUDESONIDE-FORMOTEROL FUMARATE 160-4.5 MCG/ACT IN AERO: 2.0000 | INHALATION_SPRAY | Freq: Two times a day (BID) | RESPIRATORY_TRACT | Status: DC

## 2012-12-13 MED ORDER — BUPROPION HCL ER (XL) 150 MG PO TB24: 150.0000 mg | ORAL_TABLET | Freq: Two times a day (BID) | ORAL | Status: DC

## 2012-12-13 NOTE — Progress Notes (Signed)
[address] [place] [address]   Phone [phone]  Subjective:    Patient ID: Rachel Perez, female    DOB: December 22, 1940, 72 y.o.   MRN: [id number]  HPI Pt presents for CPE.  She is doing better than she has been doing in a long time.  It was a long winter with the cold weather but she is excited now that it is spring.  She has finally accepted that she is not going to get improvement with her breathing.  She still cannot afford pulmonary rehab.  She is now getting people to do things for her that cause her to have worsening SOB.  Currently her SOB is bad because she ran out of her Symbicort 2 days ago.  She has used more of her albuterol since then and on the really cold days.  She saw pulmonology last month and was told that her lung function was stable by PFT, pulse ox was 93% there also.  She is interested in a handicap sticker because she goes to her granddaughters football games (they are cheerleaders) and has trouble walking from the end of the [place] parking lot. She does not need it for grocery stores because there parking lots are not that big. She finds that she is so SOB when she gets into the arena that she cannot catch up and feels bad the whole time during the game.  Her breathing is not bad enough for oxygen.  Her dry skin is better with the cream from last year.  Her depression/ anxiety is the best it has been in a long time. She finds that her anxiety is better as well as her SE from the Wellbutrin when she takes it bid.  She still wants to smoke all the time but also knows that she cannot because of her lungs.  She had her mammogram last month, is scheduled for bone density in May.      Review of Systems  Constitutional: Negative.   HENT: Negative.   Eyes: Negative.   Respiratory: Positive for cough (currently more productive than normal), shortness of breath and wheezing.   Cardiovascular: Negative.   Gastrointestinal: Negative.   Endocrine: Negative.     Genitourinary: Positive for dysuria (has had for a couple of weeks knows she has vaginal dryness but this seems worse than normal).  Musculoskeletal: Negative.   Skin:       Dry skin - has recently started using a humidifier and hopes that will help.  Allergic/Immunologic: Positive for environmental allergies (typical for her in the spring).  Neurological: Negative.   Hematological: Negative.   Psychiatric/Behavioral: The patient is nervous/anxious (much better).        Objective:   Physical Exam  Vitals reviewed. Constitutional: She is oriented to person, place, and time. She appears well-developed and well-nourished.  HENT:  Head: Normocephalic and atraumatic.  Right Ear: Hearing, tympanic membrane, external ear and ear canal normal.  Left Ear: Hearing, tympanic membrane, external ear and ear canal normal.  Nose: Mucosal edema (pale and swollen) present.  Mouth/Throat: Uvula is midline, oropharynx is clear and moist and mucous membranes are normal.  Eyes: Conjunctivae and EOM are normal. Pupils are equal, round, and reactive to light.  Neck: Neck supple. No thyromegaly present.  Cardiovascular: Normal rate, regular rhythm and normal heart sounds.   No murmur heard. Pulmonary/Chest: Effort normal. She has wheezes (all lung fields - expiratory).  Abdominal: Soft. Bowel sounds are normal.  Musculoskeletal: Normal range of motion.  Lymphadenopathy:    She has no cervical adenopathy.  Neurological: She is alert and oriented to person, place, and time. She has normal reflexes.  Skin: Skin is warm and dry.  Overall skin is dry  Psychiatric: She has a normal mood and affect. Her behavior is normal. Judgment and thought content normal.   Results for orders placed in visit on 12/13/12  POCT URINALYSIS DIPSTICK      Result Value Range   Color, UA yellow     Clarity, UA hazy     Glucose, UA neg     Bilirubin, UA neg     Ketones, UA neg     Spec Grav, UA 1.025     Blood, UA  moderate     pH, UA 5.5     Protein, UA neg     Urobilinogen, UA 0.2     Nitrite, UA neg     Leukocytes, UA large (3+)    POCT UA - MICROSCOPIC ONLY      Result Value Range   WBC, Ur, HPF, POC tntc     RBC, urine, microscopic 0-1     Bacteria, U Microscopic 2+     Mucus, UA 5-8     Epithelial cells, urine per micros neg     Crystals, Ur, HPF, POC neg     Casts, Ur, LPF, POC neg     Yeast, UA neg            Assessment & Plan:  Annual physical exam - Plan: CBC, Lipid panel, Comprehensive metabolic panel, TSH, POCT urinalysis dipstick, Vitamin D, 25-hydroxy, IFOBT POC (occult bld, rslt in office)  Dysuria - Plan: POCT UA - Microscopic Only, Urine culture  UTI (urinary tract infection) - Plan: Urine culture, nitrofurantoin, macrocrystal-monohydrate, (MACROBID) 100 MG capsule  Dry skin - Plan: triamcinolone cream (KENALOG) 0.1 % - continue use of humidifier as that should help.  Anxiety - Plan: buPROPion (WELLBUTRIN XL) 150 MG 24 hr tablet, ALPRAZolam (XANAX) 0.5 MG tablet  COPD (chronic obstructive pulmonary disease) - Plan: budesonide-formoterol (SYMBICORT) 160-4.5 MCG/ACT inhaler, tiotropium (SPIRIVA HANDIHALER) 18 MCG inhalation capsule, ipratropium-albuterol (DUONEB) 0.5-2.5 (3) MG/3ML SOLN  Osteoporosis, unspecified - Continue her daily dose of Vit D - pt has bone density scheduled for last month.  Will adjust treatment plan if indicated.  Plan: alendronate (FOSAMAX) 70 MG tablet  Colon Cancer screening - pt still declines colonoscopy - she will do stool cards at home and return them for testing.  Handicap sticker form filled out due to patient low O2 and significant COPD and SOB with walking.  See scanned form in media.  Dr Merla Riches also evaluated and examined the patient.  He agreed with my assessment and plan.   I have reviewed and agree with documentation. Robert P. Merla Riches, M.D.

## 2012-12-15 ENCOUNTER — Other Ambulatory Visit: Payer: Self-pay | Admitting: Physician Assistant

## 2012-12-16 ENCOUNTER — Other Ambulatory Visit: Payer: Self-pay | Admitting: Physician Assistant

## 2012-12-16 MED ORDER — ERGOCALCIFEROL 1.25 MG (50000 UT) PO CAPS: 50000.0000 [IU] | ORAL_CAPSULE | ORAL | Status: DC

## 2013-03-08 ENCOUNTER — Other Ambulatory Visit: Payer: Self-pay | Admitting: Physician Assistant

## 2013-03-31 ENCOUNTER — Encounter (HOSPITAL_COMMUNITY): Payer: Self-pay

## 2013-03-31 ENCOUNTER — Emergency Department (HOSPITAL_COMMUNITY): Payer: Medicare Other

## 2013-03-31 ENCOUNTER — Ambulatory Visit (INDEPENDENT_AMBULATORY_CARE_PROVIDER_SITE_OTHER): Payer: Medicare Other | Admitting: Emergency Medicine

## 2013-03-31 ENCOUNTER — Inpatient Hospital Stay (HOSPITAL_COMMUNITY)
Admission: EM | Admit: 2013-03-31 | Discharge: 2013-04-03 | DRG: 190 | Disposition: A | Discharge status: Home Health | Payer: Medicare Other | Attending: Family Medicine | Admitting: Family Medicine

## 2013-03-31 VITALS — BP 138/78 | HR 104 | Temp 98.2°F | Resp 16 | Ht 67.0 in | Wt 165.0 lb

## 2013-03-31 DIAGNOSIS — J449 Chronic obstructive pulmonary disease, unspecified: Secondary | ICD-10-CM

## 2013-03-31 DIAGNOSIS — J189 Pneumonia, unspecified organism: Secondary | ICD-10-CM

## 2013-03-31 DIAGNOSIS — J441 Chronic obstructive pulmonary disease with (acute) exacerbation: Secondary | ICD-10-CM

## 2013-03-31 DIAGNOSIS — R079 Chest pain, unspecified: Secondary | ICD-10-CM

## 2013-03-31 DIAGNOSIS — Z79899 Other long term (current) drug therapy: Secondary | ICD-10-CM

## 2013-03-31 DIAGNOSIS — E079 Disorder of thyroid, unspecified: Secondary | ICD-10-CM | POA: Diagnosis present

## 2013-03-31 DIAGNOSIS — J45901 Unspecified asthma with (acute) exacerbation: Principal | ICD-10-CM | POA: Diagnosis present

## 2013-03-31 DIAGNOSIS — J439 Emphysema, unspecified: Secondary | ICD-10-CM | POA: Diagnosis present

## 2013-03-31 DIAGNOSIS — J9600 Acute respiratory failure, unspecified whether with hypoxia or hypercapnia: Secondary | ICD-10-CM

## 2013-03-31 DIAGNOSIS — Z8639 Personal history of other endocrine, nutritional and metabolic disease: Secondary | ICD-10-CM | POA: Diagnosis present

## 2013-03-31 DIAGNOSIS — J40 Bronchitis, not specified as acute or chronic: Secondary | ICD-10-CM

## 2013-03-31 DIAGNOSIS — F3289 Other specified depressive episodes: Secondary | ICD-10-CM | POA: Diagnosis present

## 2013-03-31 DIAGNOSIS — F411 Generalized anxiety disorder: Secondary | ICD-10-CM

## 2013-03-31 DIAGNOSIS — Z66 Do not resuscitate: Secondary | ICD-10-CM | POA: Diagnosis present

## 2013-03-31 DIAGNOSIS — R0600 Dyspnea, unspecified: Secondary | ICD-10-CM

## 2013-03-31 DIAGNOSIS — F17200 Nicotine dependence, unspecified, uncomplicated: Secondary | ICD-10-CM | POA: Diagnosis present

## 2013-03-31 DIAGNOSIS — J9602 Acute respiratory failure with hypercapnia: Secondary | ICD-10-CM | POA: Diagnosis present

## 2013-03-31 DIAGNOSIS — M810 Age-related osteoporosis without current pathological fracture: Secondary | ICD-10-CM | POA: Diagnosis present

## 2013-03-31 DIAGNOSIS — F329 Major depressive disorder, single episode, unspecified: Secondary | ICD-10-CM | POA: Diagnosis present

## 2013-03-31 DIAGNOSIS — J4489 Other specified chronic obstructive pulmonary disease: Secondary | ICD-10-CM

## 2013-03-31 DIAGNOSIS — E872 Acidosis: Secondary | ICD-10-CM

## 2013-03-31 DIAGNOSIS — E8729 Other acidosis: Secondary | ICD-10-CM

## 2013-03-31 DIAGNOSIS — F32A Depression, unspecified: Secondary | ICD-10-CM | POA: Diagnosis present

## 2013-03-31 DIAGNOSIS — R0602 Shortness of breath: Secondary | ICD-10-CM

## 2013-03-31 LAB — POCT I-STAT 3, ART BLOOD GAS (G3+)
Patient temperature: 37.1
TCO2: 27 mmol/L (ref 0–100)
pCO2 arterial: 68.5 mmHg (ref 35.0–45.0)
pH, Arterial: 7.165 — CL (ref 7.350–7.450)

## 2013-03-31 LAB — BASIC METABOLIC PANEL
BUN: 10 mg/dL (ref 6–23)
Calcium: 9.1 mg/dL (ref 8.4–10.5)
GFR calc Af Amer: >90 mL/min (ref >90)
GFR calc non Af Amer: 86 mL/min — ABNORMAL LOW (ref >90)
Glucose, Bld: 144 mg/dL — ABNORMAL HIGH (ref 70–99)
Potassium: 4.1 mEq/L (ref 3.5–5.1)
Sodium: 134 mEq/L — ABNORMAL LOW (ref 135–145)

## 2013-03-31 LAB — BLOOD GAS, ARTERIAL
Acid-base deficit: 2.4 mmol/L — ABNORMAL HIGH (ref 0.0–2.0)
Drawn by: 312761
O2 Content: 4 L/min
O2 Content: 3 L/min
O2 Saturation: 98.1 %
TCO2: 26.7 mmol/L (ref 0–100)
TCO2: 29 mmol/L (ref 0–100)
pCO2 arterial: 66 mmHg (ref 35.0–45.0)
pCO2 arterial: 65.4 mmHg (ref 35.0–45.0)
pH, Arterial: 7.239 — ABNORMAL LOW (ref 7.350–7.450)
pO2, Arterial: 122 mmHg — ABNORMAL HIGH (ref 80.0–100.0)

## 2013-03-31 LAB — CBC WITH DIFFERENTIAL/PLATELET
Basophils Absolute: 0 10*3/uL (ref 0.0–0.1)
Basophils Relative: 1 % (ref 0–1)
Eosinophils Absolute: 0 10*3/uL (ref 0.0–0.7)
Hemoglobin: 14.4 g/dL (ref 12.0–15.0)
MCH: 30.1 pg (ref 26.0–34.0)
MCHC: 32.4 g/dL (ref 30.0–36.0)
Monocytes Relative: 3 % (ref 3–12)
Neutro Abs: 5.8 10*3/uL (ref 1.7–7.7)
Neutrophils Relative %: 90 % — ABNORMAL HIGH (ref 43–77)
Platelets: 183 10*3/uL (ref 150–400)
RDW: 13.2 % (ref 11.5–15.5)

## 2013-03-31 LAB — URINALYSIS, ROUTINE W REFLEX MICROSCOPIC
Nitrite: NEGATIVE
Protein, ur: NEGATIVE mg/dL
Specific Gravity, Urine: 1.01 (ref 1.005–1.030)
Urobilinogen, UA: 0.2 mg/dL (ref 0.0–1.0)

## 2013-03-31 LAB — MRSA PCR SCREENING: MRSA by PCR: NEGATIVE

## 2013-03-31 LAB — URINE MICROSCOPIC-ADD ON

## 2013-03-31 LAB — TROPONIN I: Troponin I: <0.3 ng/mL (ref <0.30)

## 2013-03-31 MED ORDER — LORAZEPAM 2 MG/ML IJ SOLN
0.5000 mg | Freq: Once | INTRAMUSCULAR | Status: DC
  Filled 2013-03-31: qty 1

## 2013-03-31 MED ORDER — ACETAMINOPHEN 325 MG PO TABS: 650.0000 mg | ORAL_TABLET | Freq: Four times a day (QID) | ORAL | Status: DC | PRN

## 2013-03-31 MED ORDER — BIOTENE DRY MOUTH MT LIQD
15.0000 mL | Freq: Two times a day (BID) | OROMUCOSAL | Status: DC
  Administered 2013-03-31 – 2013-04-02 (×5): 15 mL via OROMUCOSAL

## 2013-03-31 MED ORDER — METHYLPREDNISOLONE SODIUM SUCC 125 MG IJ SOLR
60.0000 mg | Freq: Four times a day (QID) | INTRAMUSCULAR | Status: DC
  Administered 2013-03-31 – 2013-04-02 (×8): 60 mg via INTRAVENOUS
  Filled 2013-03-31 (×11): qty 0.96

## 2013-03-31 MED ORDER — LEVOFLOXACIN IN D5W 750 MG/150ML IV SOLN
750.0000 mg | INTRAVENOUS | Status: DC
  Administered 2013-04-01: 750 mg via INTRAVENOUS
  Filled 2013-03-31 (×2): qty 150

## 2013-03-31 MED ORDER — ALPRAZOLAM 0.5 MG PO TABS
0.5000 mg | ORAL_TABLET | Freq: Every evening | ORAL | Status: DC | PRN
  Administered 2013-03-31 – 2013-04-02 (×3): 0.5 mg via ORAL
  Filled 2013-03-31 (×2): qty 2
  Filled 2013-03-31: qty 1

## 2013-03-31 MED ORDER — ALBUTEROL SULFATE (2.5 MG/3ML) 0.083% IN NEBU
2.5000 mg | INHALATION_SOLUTION | Freq: Once | RESPIRATORY_TRACT | Status: AC
  Administered 2013-03-31: 2.5 mg via RESPIRATORY_TRACT

## 2013-03-31 MED ORDER — SODIUM CHLORIDE 0.9 % IJ SOLN
3.0000 mL | Freq: Two times a day (BID) | INTRAMUSCULAR | Status: DC
Start: 2013-03-31 — End: 2013-04-03
  Administered 2013-03-31 – 2013-04-03 (×6): 3 mL via INTRAVENOUS

## 2013-03-31 MED ORDER — HEPARIN SODIUM (PORCINE) 5000 UNIT/ML IJ SOLN
5000.0000 [IU] | Freq: Three times a day (TID) | INTRAMUSCULAR | Status: DC
  Administered 2013-03-31 – 2013-04-03 (×9): 5000 [IU] via SUBCUTANEOUS
  Filled 2013-03-31 (×13): qty 1

## 2013-03-31 MED ORDER — METHYLPREDNISOLONE SODIUM SUCC 125 MG IJ SOLR: 60.0000 mg | Freq: Once | INTRAMUSCULAR | Status: DC

## 2013-03-31 MED ORDER — ALBUTEROL SULFATE (5 MG/ML) 0.5% IN NEBU
10.0000 mg | INHALATION_SOLUTION | RESPIRATORY_TRACT | Status: DC
  Administered 2013-03-31: 10 mg via RESPIRATORY_TRACT
  Filled 2013-03-31: qty 0.5

## 2013-03-31 MED ORDER — ALBUTEROL (5 MG/ML) CONTINUOUS INHALATION SOLN
INHALATION_SOLUTION | RESPIRATORY_TRACT | Status: AC
  Filled 2013-03-31: qty 20

## 2013-03-31 MED ORDER — BUPROPION HCL ER (XL) 150 MG PO TB24
150.0000 mg | ORAL_TABLET | Freq: Every day | ORAL | Status: DC
  Administered 2013-04-01 – 2013-04-03 (×3): 150 mg via ORAL
  Filled 2013-03-31 (×4): qty 1

## 2013-03-31 MED ORDER — IPRATROPIUM BROMIDE 0.02 % IN SOLN
0.5000 mg | Freq: Once | RESPIRATORY_TRACT | Status: AC
  Administered 2013-03-31: 0.5 mg via RESPIRATORY_TRACT

## 2013-03-31 MED ORDER — ALBUTEROL SULFATE (5 MG/ML) 0.5% IN NEBU
10.0000 mg | INHALATION_SOLUTION | Freq: Once | RESPIRATORY_TRACT | Status: AC
  Administered 2013-03-31: 10 mg via RESPIRATORY_TRACT
  Filled 2013-03-31: qty 0.5

## 2013-03-31 MED ORDER — LORAZEPAM 2 MG/ML IJ SOLN
0.5000 mg | Freq: Once | INTRAMUSCULAR | Status: AC
  Administered 2013-03-31: 0.5 mg via INTRAVENOUS

## 2013-03-31 MED ORDER — ACETAMINOPHEN 650 MG RE SUPP: 650.0000 mg | Freq: Four times a day (QID) | RECTAL | Status: DC | PRN

## 2013-03-31 MED ORDER — ALBUTEROL SULFATE (5 MG/ML) 0.5% IN NEBU: 2.5000 mg | INHALATION_SOLUTION | RESPIRATORY_TRACT | Status: DC | PRN

## 2013-03-31 MED ORDER — NICOTINE 21 MG/24HR TD PT24
21.0000 mg | MEDICATED_PATCH | Freq: Every day | TRANSDERMAL | Status: DC
  Administered 2013-03-31 – 2013-04-03 (×4): 21 mg via TRANSDERMAL
  Filled 2013-03-31 (×4): qty 1

## 2013-03-31 MED ORDER — SODIUM CHLORIDE 0.9 % IV BOLUS (SEPSIS)
1000.0000 mL | Freq: Once | INTRAVENOUS | Status: AC
  Administered 2013-03-31: 1000 mL via INTRAVENOUS

## 2013-03-31 MED ORDER — METHYLPREDNISOLONE SODIUM SUCC 125 MG IJ SOLR
125.0000 mg | Freq: Once | INTRAMUSCULAR | Status: AC
  Administered 2013-03-31: 125 mg via INTRAMUSCULAR

## 2013-03-31 MED ORDER — ALBUTEROL SULFATE (5 MG/ML) 0.5% IN NEBU
2.5000 mg | INHALATION_SOLUTION | RESPIRATORY_TRACT | Status: DC
  Administered 2013-03-31 (×4): 2.5 mg via RESPIRATORY_TRACT
  Filled 2013-03-31 (×4): qty 0.5

## 2013-03-31 MED ORDER — LEVOFLOXACIN IN D5W 750 MG/150ML IV SOLN
750.0000 mg | Freq: Once | INTRAVENOUS | Status: AC
  Administered 2013-03-31: 750 mg via INTRAVENOUS
  Filled 2013-03-31: qty 150

## 2013-03-31 MED ORDER — IPRATROPIUM BROMIDE 0.02 % IN SOLN
0.5000 mg | RESPIRATORY_TRACT | Status: DC
  Administered 2013-03-31 – 2013-04-03 (×16): 0.5 mg via RESPIRATORY_TRACT
  Filled 2013-03-31 (×18): qty 2.5

## 2013-03-31 NOTE — ED Notes (Signed)
Phlebotomy at bedside for blood cultures.  

## 2013-03-31 NOTE — Progress Notes (Signed)
Subjective:     Rachel Perez is a 72 y.o. female self-referred for evaluation and treatment of COPD. The patient is currently having symptoms / an exacerbation. Current symptoms include acute dyspnea, cough productive of white sputum in moderate amounts and wheezing. Symptoms have been present since several days ago and have been rapidly worsening. She denies weight increase, weight loss, chills, fever and colored sputum. Associated symptoms include fatigue.  This episode appears to have been triggered by upper respiratory infection. Treatments tried for the current exacerbation: albuterol inhaler and inhaled steroid. The patient has been having similar episodes for approximately 7 years. She uses 1 pillow at night. Patient currently is not on home oxygen therapy.. The patient is having no constitutional symptoms, denying fever, chills, anorexia, or weight loss. The patient has been hospitalized for this condition before. She quit smoking  ago. The patient is experiencing exercise intolerance (walking).  Previous Report Reviewed: historical medical records, imaging reports: chest x-ray positive for copd, lab reports, office notes and x-ray reports   The following portions of the patient's history were reviewed and updated as appropriate: allergies, current medications, past family history, past medical history, past social history, past surgical history and problem list.  Review of Systems Review of systems not obtained due to patient factors.    Objective:    BP 138/78  Pulse 104  Temp(Src) 98.2 F (36.8 C) (Oral)  Resp 16  Ht 5\' 7"  (1.702 m)  Wt 165 lb (74.844 kg)  BMI 25.84 kg/m2  SpO2 69%  General Appearance:    Alert, cooperative, marked distress.  Pursed lip breathing, tachypneic, appears stated age O2 sat 74  Head:    Normocephalic, without obvious abnormality, atraumatic  Eyes:    PERRL, conjunctiva/corneas clear, EOM's intact, fundi    benign, both eyes  Ears:    Normal TM's  and external ear canals, both ears  Nose:   Nares normal, septum midline, mucosa normal, no drainage    or sinus tenderness  Throat:   Lips, mucosa, and tongue normal; teeth and gums normal  Neck:   Supple, symmetrical, trachea midline, no adenopathy;    thyroid:  no enlargement/tenderness/nodules; no carotid   bruit or JVD  Back:     Symmetric, no curvature, ROM normal, no CVA tenderness  Lungs:     Diffuse wheezing and poor air movement.  Recruitment.  Short phrases.  Chest Wall:    No tenderness or deformity   Heart:    Regular rate and rhythm, S1 and S2 normal, no murmur, rub   or gallop     Abdomen:     Soft, non-tender, bowel sounds active all four quadrants,    no masses, no organomegaly        Extremities:   Extremities normal, atraumatic, no cyanosis or edema  Pulses:   2+ and symmetric all extremities  Skin:   Skin color, texture, turgor normal, no rashes or lesions  Lymph nodes:   Cervical, supraclavicular, and axillary nodes normal  Neurologic:   CNII-XII intact, normal strength, sensation and reflexes    throughout    Diagnostic Review Chest x-ray: pending     EKG:  RSR with nonspecific ST changes   Assessment:    Acute exacerbation of chronic bronchitis     Plan:   Patient given neb albuterol and atrovent and a second treatment of only albuterol with moderate increase in air movement  Discussed diagnosis, its evaluation, treatment and usual course. All questions answered. Chest x-ray  per orders. Steroid burst per orders. Admit to hospital.

## 2013-03-31 NOTE — ED Notes (Signed)
Spoke with flow manager who confirmed step down bed request has been placed

## 2013-03-31 NOTE — ED Notes (Signed)
Charge, RN made aware that antibiotic given prior to blood cultures being drawn

## 2013-03-31 NOTE — Progress Notes (Signed)
I went to evaluate pt with recent ABG unchanged from prior. CPAP has not being placed since pt declined procedure. We were not notified about this. Pt is still with significant increase in work of breathing although wheezing has improved from admission. P/ after extensive discussion with pt she agrees to CPAP. RT notified.  Signed: D. Piloto Rolene Arbour, MD Family Medicine  PGY-3

## 2013-03-31 NOTE — Progress Notes (Signed)
Pt refuses to wear CPAP at this time due to anxiety. RN and MD aware at this time. ABG results called to MD by RN. RT will continue to monitor.

## 2013-03-31 NOTE — Progress Notes (Signed)
CRITICAL VALUE ALERT  Critical value received:  ABG-pH: 7.198, C02: 66, PO2: 122, Bicarb: 24.7, BaseExcess: -2.4  Date of notification:  03/31/13  Time of notification:  1730  Critical value read back:YES  Nurse who received alert:  Dellie Catholic RN  MD notified (1st page):  Jennette Kettle  Time of first page:  1726  MD notified (2nd page):  Time of second page:  Responding MD:   Time MD responded:

## 2013-03-31 NOTE — ED Notes (Signed)
Pt & family updated on need for new bed assignment d/t arterial blood gas reading

## 2013-03-31 NOTE — Consult Note (Signed)
PULMONARY  / CRITICAL CARE MEDICINE  Name: Rachel Perez MRN: [id number] DOB: 1941-02-23    ADMISSION DATE:  03/31/2013 CONSULTATION DATE: 03/31/2013  REFERRING MD : Nestor Ramp, MD PRIMARY SERVICE: Family medicine teaching service   CHIEF COMPLAINT:  SOB  BRIEF PATIENT DESCRIPTION: Patient is 72 yrs old CF with significant smoking hx 40 pack/year. Now with SOB that started Tuesday last week . Now she has an abnormal ABG and we were consulted for further recommendation for possible BiPAP or MICU transfer .  SIGNIFICANT EVENTS / STUDIES:  ABG :  7.239, 40,981,19   LINES / TUBES: Telemetry monitoring    ANTIBIOTICS: Levaquin  HISTORY OF PRESENT ILLNESS:  Patient is 72 yrs old CF with hx of smoking 40 pack - year , still smokes, anxiety, COPD on Spiriva , Symbicort  ,  Pro-air , not on O2 , she is taking Wellbutrin trying to quit , up-todate with her vaccines, she started getting sick last Tuesday with a cold , progressive SOB , congestion , unable to sleep from the SOB , on Thursday she took benadryl to try to help her which resulted in worsening of the SOB , dry mouth , dry cough and inability to bring any thing up , she tried to quot smoking Thursday and now she feels very anxious .  PAST MEDICAL HISTORY :  Past Medical History  Diagnosis Date  . COPD (chronic obstructive pulmonary disease)    Past Surgical History  Procedure Laterality Date  . Abdominal hysterectomy     Prior to Admission medications   Medication Sig Start Date End Date Taking? Authorizing Provider  albuterol (PROAIR HFA) 108 (90 BASE) MCG/ACT inhaler Inhale 2 puffs into the lungs every 6 (six) hours as needed for wheezing.   Yes Historical Provider, MD  alendronate (FOSAMAX) 70 MG tablet Take 1 tablet (70 mg total) by mouth every 7 (seven) days. Take with a full glass of water on an empty stomach. 12/13/12  Yes Morrell Riddle, PA-C  ALPRAZolam Prudy Feeler) 0.5 MG tablet Take 1 tablet (0.5 mg total) by mouth at  bedtime as needed. 12/13/12  Yes Morrell Riddle, PA-C  budesonide-formoterol (SYMBICORT) 160-4.5 MCG/ACT inhaler Inhale 2 puffs into the lungs 2 (two) times daily. 12/13/12  Yes Morrell Riddle, PA-C  buPROPion (WELLBUTRIN XL) 150 MG 24 hr tablet Take 150 mg by mouth daily.   Yes Historical Provider, MD  Calcium Carbonate (CALCIUM 500 PO) Take 500 Units by mouth 3 (three) times daily.   Yes Historical Provider, MD  Cholecalciferol (VITAMIN D) 2000 UNITS tablet Take 4,000 Units by mouth daily.    Yes Historical Provider, MD  ipratropium-albuterol (DUONEB) 0.5-2.5 (3) MG/3ML SOLN Take 3 mLs by nebulization 2 (two) times daily. 12/13/12  Yes Morrell Riddle, PA-C  tiotropium (SPIRIVA HANDIHALER) 18 MCG inhalation capsule Place 1 capsule (18 mcg total) into inhaler and inhale daily. 12/13/12  Yes Morrell Riddle, PA-C  triamcinolone cream (KENALOG) 0.1 % Apply topically 2 (two) times daily. 12/13/12   Morrell Riddle, PA-C   Allergies  Allergen Reactions  . Penicillins Other (See Comments)    psorasis    FAMILY HISTORY:  Family History  Problem Relation Age of Onset  . Dementia Mother   . Cancer Father   . Heart disease Father   . Thyroid disease Sister   . Thyroid disease Daughter    SOCIAL HISTORY:  reports that she quit smoking about 3 years ago. Her smoking use included  Cigarettes. She smoked 0.00 packs per day for 40 years. She does not have any smokeless tobacco history on file. She reports that  drinks alcohol. She reports that she does not use illicit drugs.  REVIEW OF SYSTEMS:   Per HPI with the following additions: No fever chills rigors , no N/V/D  Pt denies chest pain, palpitations, headaches, dizziness, numbness or weakness. She sleeps on two thin pillows for comfort but no for breathing difficulties. No SOB with exertion. She does her ADL's and goes shopping by herself and has been functional until now. No changes on urinary or BM habits. No unintentional weigh loss/gain. No LE swelling.   Otherwise 12 point review of systems was performed and was unremarkable.  SUBJECTIVE:   VITAL SIGNS: Temp:  [98.2 F (36.8 C)-98.5 F (36.9 C)] 98.5 F (36.9 C) (07/19 2030) Pulse Rate:  [91-108] 108 (07/19 2030) Resp:  [16-29] 21 (07/19 2030) BP: (101-152)/(40-93) 150/43 mmHg (07/19 2030) SpO2:  [69 %-100 %] 98 % (07/19 2030) Weight:  [71.8 kg (158 lb 4.6 oz)-74.844 kg (165 lb)] 71.8 kg (158 lb 4.6 oz) (07/19 1603)  PHYSICAL EXAMINATION: General:  Anxious , in mild acute distress from SOB .  Neuro:  AXO3  HEENT:  Dry mucous membranes  Neck:  No Stridor , supple ,  Cardiovascular: NS1S2 no M/G/R Lungs: decrease air entry with bilateral wheezing , no crackles  Abdomen:  Soft lax +BS , no tenderness  Musculoskeletal:  No deformities  Skin:  No Rash   Recent Labs Lab 03/31/13 1102  NA 134*  K 4.1  CL 98  CO2 27  BUN 10  CREATININE 0.72  GLUCOSE 144*    Recent Labs Lab 03/31/13 1102  HGB 14.4  HCT 44.4  WBC 6.4  PLT 183   Dg Chest 2 View  03/31/2013   *RADIOLOGY REPORT*  Clinical Data: Shortness of breath, history of emphysema  CHEST - 2 VIEW  Comparison: 06/20/2008  Findings: Hyperinflation consistent with COPD.  Heart size and vascular pattern are normal. Mild fluffy opacity in right hilar area.  No pleural effusions.  IMPRESSION: Early right middle lobe infiltrate cannot be excluded, which could represent pneumonia.   Original Report Authenticated By: Esperanza Heir, M.D.    ASSESSMENT / PLAN:  72 yrs old CF with severe COPD exacebation and respiratory failure .  -- COPD exacebation , from the hx patient has severe symptoms of cough and SOB for few days now , she started having that after URTI , at this point no need for the patient to be in the unit , she is stable on the stepdown, will move her to a room with visual monitoring from the ICU staff , she will need to be on IV steroids given her severe presentation, Solumedrol 60 mg IV Q6 for 48-72 hrs then  switch to PO prednisone 40 mg po daily and taper over 7-10 days, she will also need to be on IV ABX , Levaquin for atypical coverage , ?? RLL/RML infiltrate vs scarring , C/W Nebs Atrovent and Albuterol , stop Spiriva and Symbicort for now .  She is telling me she is up-to date with her vaccines. Counseling for quitting smoking was done and she is the process to quit .  -- Respiratory failure ,  Precipitated by COPD exacerbation , ?? PNA, no need to move to the unit now , she is able to talk in full sentences with intact MS , she is very anxious , Will give  low dose Xanax and continue with Wellbutrin to try help her tolerate BiPAP , her ABG is improving but still not normalized, I discussed with her that the prednisone and the albuterol nebs treatment is making her anxiety worse at this point , Apply BiPAP as tolerated , patient wishes is to be DNR and DNI if she gets to the degree she needs to be resuscitated, that code status was changed in the orders .   - Rest of medical problems to be managed by primary team   - Consider checking TSH level with her level of anxiety   - Hep SQ for DVT prophylaxis  - Check ABG if respiratory  status gets worse , her last one was improved before the BiPAP trail    Pulmonary and Critical Care Medicine [facility] Pager: [phone]  03/31/2013, 9:12 PM

## 2013-03-31 NOTE — H&P (Signed)
[facility]MT[facility] Attending Admission Note: Denny Levy MD [phone] pager office [phone] I  have seen and examined this patient, reviewed their chart. I have discussed this patient with the resident. I agree with the resident's findings, assessment and care plan.Given ABG I agree with bipap. Will empirically treat for CAP in setting of severe COPD given CXR findings although no fever and no elevation of WBC.Marland Kitchen

## 2013-03-31 NOTE — Progress Notes (Signed)
RT attempted to place CPAP on patient, patient cannot tolerate it at this time. Notified Dr. Aviva Signs and she states she will consult PCCM and at this time she was unable to order sedatives that could further deplete patients respiratory drive. MD ordered BIPAP, RT notified.

## 2013-03-31 NOTE — ED Provider Notes (Signed)
History    CSN: [id number] Arrival date & time 03/31/13  1020  First MD Initiated Contact with Patient 03/31/13 1030     Chief Complaint  Patient presents with  . Shortness of Breath   (Consider location/radiation/quality/duration/timing/severity/associated sxs/prior Treatment) HPI Comments: 72 yo female with smoking in the past, copd not on Home O2, no recent abx/ steroids except steroids in prompt care prior to arrival presents with cough, sob worsening for 5 days. Nothing improves, has nebs at home.  No fevers.  Productive cough.  Similar to previous but more severe.  Patient is a 72 y.o. female presenting with shortness of breath. The history is provided by the patient.  Shortness of Breath Severity:  Moderate Associated symptoms: cough   Associated symptoms: no abdominal pain, no chest pain, no fever, no headaches, no neck pain, no rash and no vomiting    Past Medical History  Diagnosis Date  . COPD (chronic obstructive pulmonary disease)    Past Surgical History  Procedure Laterality Date  . Abdominal hysterectomy     Family History  Problem Relation Age of Onset  . Dementia Mother   . Cancer Father   . Heart disease Father   . Thyroid disease Sister   . Thyroid disease Daughter    History  Substance Use Topics  . Smoking status: Former Smoker -- 40 years    Types: Cigarettes    Quit date: 09/13/2009  . Smokeless tobacco: Not on file  . Alcohol Use: Yes     Comment: mixed drinks   OB History   Grav Para Term Preterm Abortions TAB SAB Ect Mult Living                 Review of Systems  Constitutional: Positive for fatigue. Negative for fever, chills and appetite change.  HENT: Negative for neck pain and neck stiffness.   Eyes: Negative for visual disturbance.  Respiratory: Positive for cough and shortness of breath.   Cardiovascular: Negative for chest pain.  Gastrointestinal: Negative for vomiting and abdominal pain.  Genitourinary: Negative for dysuria  and flank pain.  Musculoskeletal: Negative for back pain.  Skin: Negative for rash.  Neurological: Negative for light-headedness and headaches.    Allergies  Penicillins  Home Medications   Current Outpatient Rx  Name  Route  Sig  Dispense  Refill  . albuterol (PROAIR HFA) 108 (90 BASE) MCG/ACT inhaler   Inhalation   Inhale 2 puffs into the lungs every 6 (six) hours as needed for wheezing.         Marland Kitchen alendronate (FOSAMAX) 70 MG tablet   Oral   Take 1 tablet (70 mg total) by mouth every 7 (seven) days. Take with a full glass of water on an empty stomach.   4 tablet   11   . ALPRAZolam (XANAX) 0.5 MG tablet   Oral   Take 1 tablet (0.5 mg total) by mouth at bedtime as needed.   30 tablet   0   . budesonide-formoterol (SYMBICORT) 160-4.5 MCG/ACT inhaler   Inhalation   Inhale 2 puffs into the lungs 2 (two) times daily.   1 Inhaler   11   . buPROPion (WELLBUTRIN XL) 150 MG 24 hr tablet   Oral   Take 150 mg by mouth daily.         . Calcium Carbonate (CALCIUM 500 PO)   Oral   Take 500 Units by mouth 3 (three) times daily.         Marland Kitchen  Cholecalciferol (VITAMIN D) 2000 UNITS tablet   Oral   Take 4,000 Units by mouth daily.          Marland Kitchen ipratropium-albuterol (DUONEB) 0.5-2.5 (3) MG/3ML SOLN   Nebulization   Take 3 mLs by nebulization 2 (two) times daily.   90 mL   11   . tiotropium (SPIRIVA HANDIHALER) 18 MCG inhalation capsule   Inhalation   Place 1 capsule (18 mcg total) into inhaler and inhale daily.   30 capsule   11   . triamcinolone cream (KENALOG) 0.1 %   Topical   Apply topically 2 (two) times daily.   45 g   1    BP 142/49  Pulse 92  Temp(Src) 98.4 F (36.9 C) (Oral)  Resp 20  SpO2 93% Physical Exam  Nursing note and vitals reviewed. Constitutional: She is oriented to person, place, and time. She appears well-developed and well-nourished.  HENT:  Head: Normocephalic and atraumatic.  Dry mm  Eyes: Conjunctivae are normal. Right eye  exhibits no discharge. Left eye exhibits no discharge.  Neck: Normal range of motion. Neck supple. No tracheal deviation present.  Cardiovascular: Regular rhythm.  Tachycardia present.   Pulmonary/Chest: No respiratory distress. She has wheezes. She has rales.  Tachypnea Mild retractions  Abdominal: Soft. She exhibits no distension. There is no tenderness. There is no guarding.  Musculoskeletal: She exhibits no edema and no tenderness.  Neurological: She is alert and oriented to person, place, and time.  Skin: Skin is warm. No rash noted.  Psychiatric: She has a normal mood and affect.    ED Course  Procedures (including critical care time) Labs Reviewed  CBC WITH DIFFERENTIAL - Abnormal; Notable for the following:    Neutrophils Relative % 90 (*)    Lymphocytes Relative 7 (*)    Lymphs Abs 0.4 (*)    All other components within normal limits  BASIC METABOLIC PANEL  TROPONIN I   No results found. No diagnosis found.  MDM  Most significant dyspnea episode for patient. Minimal air movement on arrival with mild retraction/ 3 word sentences.  Continuous neb and copd treatments initially.  Multiple rechecks of patient, slowly improving.  CXR reviewed, possible pneumonia, read by radiology  Cultures and abx given. Pt improved however still breathing difficulty and tight chest. Wheezing. Second continuous neb with recheck, mild improvement. ABG ordered due to slow response and copd.  Resp acidosis pH 7.1. Changed admission to step down, updated FP physician.  Updated family.  CRITICAL CARE Performed by: Enid Skeens   Total critical care time: 40 min  Critical care time was exclusive of separately billable procedures and treating other patients.  Critical care was necessary to treat or prevent imminent or life-threatening deterioration.  Critical care was time spent personally by me on the following activities: development of treatment plan with patient and/or surrogate  as well as nursing, discussions with consultants, evaluation of patient's response to treatment, examination of patient, obtaining history from patient or surrogate, ordering and performing treatments and interventions, ordering and review of laboratory studies, ordering and review of radiographic studies, pulse oximetry and re-evaluation of patient's condition.   Enid Skeens, MD 03/31/13 2150

## 2013-03-31 NOTE — Progress Notes (Signed)
Discussed with CCM regarding Pt's respiratory status and her refusal of BIPAP. We appreciate their input and will f/u recommendations. Signed: D. Piloto Rolene Arbour, MD Family Medicine  PGY-3

## 2013-03-31 NOTE — ED Notes (Signed)
Pt in from UC off of [place], pt in for increased SOB onset x6 days, pt received x 2 neb tx PTA & 125 mg Solumedrol IV, pt A&O x4, follows commands, speaks in complete sentences,  Denies CP, N/V/D, pt hx of COPD, denies using O2 @ home

## 2013-03-31 NOTE — H&P (Signed)
Family Medicine Teaching [facility] Admission History and Physical Service Pager: [phone]  Patient name: Rachel Perez Medical record number: [id number] Date of birth: 15-Jul-1941 Age: 72 y.o. Gender: female  Primary Care Provider: [place] Consultants: none  Code Status: full  Chief Complaint: shortness of breath  Assessment and Plan: Rachel Perez is a 72 y.o. year old female presenting with worsening shortness of breath over the past 6 days as well as productive cough and wheezing. PMH is significant for COPD, osteoporosis, and anxiety.  # COPD exacerbation - cough/sob x6 days, now w/ CXR showing possible RML infiltrate and requiring 2-3L [place] after 2 nebs. ABG that showed respiratory acidosis.  - admit to step down unit. Attending Dr. Denny Levy, MD - CPAP - IV levaquin - albuterol Q1h/Q2h and ipratropium Q4h - solumedrol 60 mg Q6 I/v than consider taper with prednisone - repeat ABG after 2 h of CPAP - consider Pulmonology/CCM consultation if respiratory status does not improve with present regimen.   # Osteoporosis - Will hold alendronate and vit d and as she improved will resume home regimen  # Anxiety - continue home wellbutrin - also has xanax 0.5 qhs prn at home, restart if needed  # Hx of Thyroid Disorder (?) Normal TSH. No on therapy for it.   FEN/GI: kvo fluids, NPO until respiratory status improves Prophylaxis: SQ heparin  Disposition: admit to Stepdown  History of Present Illness: Rachel Perez is a 72 y.o. year old female presenting with 6 days of worsening shortness of breath accompanied by cough productive of white sputum. She reports yesterday sudden worsening of her condition and this morning she went to [facility] where she was evaluated and sent here. She denies fever, chills or systemic symptoms but reports has had upper respiratory symptoms at the beginning of her current illness. She reports sick contact with family members.  Pt states  this is the first time she is hospitalized for her COPD.   Review Of Systems: Per HPI with the following additions:  Pt denies chest pain, palpitations, headaches, dizziness, numbness or weakness. She sleeps on two thin pillows for comfort but no for breathing difficulties. No SOB with exertion. She does her ADL's and goes shopping by herself and has been functional until now.  No changes on urinary or BM habits. No unintentional weigh loss/gain. No LE swelling.  Otherwise 12 point review of systems was performed and was unremarkable.  Patient Active Problem List   Diagnosis Date Noted  . EMPHYSEMA 11/13/2010  . [facility] 10/01/2010  . THYROID DISORDER 09/30/2010  . ANXIETY 09/30/2010  . DEPRESSION 09/30/2010  . HEMORRHOIDS 09/30/2010  . BRONCHITIS 09/30/2010  . ASTHMA 09/30/2010  . COPD 09/30/2010  . OSTEOPOROSIS 09/30/2010  . SHORTNESS OF BREATH 09/30/2010  . CHEST PAIN 09/30/2010   Past Medical History: Past Medical History  Diagnosis Date  . COPD (chronic obstructive pulmonary disease)    Past Surgical History: Past Surgical History  Procedure Laterality Date  . Abdominal hysterectomy     Social History: History  Substance Use Topics  . Smoking status: Former Smoker -- 40 years    Types: Cigarettes    Quit date: 09/13/2009  . Smokeless tobacco: Not on file  . Alcohol Use: Yes     Comment: mixed drinks   Additional social history: Pt former smoker but occasionally smokes 1 -2 cigarettes  Please also refer to relevant sections of EMR.  Family History: Family History  Problem Relation Age of Onset  . Dementia  Mother   . Cancer Father   . Heart disease Father   . Thyroid disease Sister   . Thyroid disease Daughter    Allergies and Medications: Allergies  Allergen Reactions  . Penicillins Other (See Comments)    psorasis   No current facility-administered medications on file prior to encounter.   Current Outpatient Prescriptions on File Prior to Encounter   Medication Sig Dispense Refill  . alendronate (FOSAMAX) 70 MG tablet Take 1 tablet (70 mg total) by mouth every 7 (seven) days. Take with a full glass of water on an empty stomach.  4 tablet  11  . ALPRAZolam (XANAX) 0.5 MG tablet Take 1 tablet (0.5 mg total) by mouth at bedtime as needed.  30 tablet  0  . budesonide-formoterol (SYMBICORT) 160-4.5 MCG/ACT inhaler Inhale 2 puffs into the lungs 2 (two) times daily.  1 Inhaler  11  . Calcium Carbonate (CALCIUM 500 PO) Take 500 Units by mouth 3 (three) times daily.      . Cholecalciferol (VITAMIN D) 2000 UNITS tablet Take 4,000 Units by mouth daily.       Marland Kitchen ipratropium-albuterol (DUONEB) 0.5-2.5 (3) MG/3ML SOLN Take 3 mLs by nebulization 2 (two) times daily.  90 mL  11  . tiotropium (SPIRIVA HANDIHALER) 18 MCG inhalation capsule Place 1 capsule (18 mcg total) into inhaler and inhale daily.  30 capsule  11  . triamcinolone cream (KENALOG) 0.1 % Apply topically 2 (two) times daily.  45 g  1    Objective: BP 101/60  Pulse 91  Temp(Src) 98.4 F (36.9 C) (Oral)  Resp 24  SpO2 96% Exam: General: mild to moderated distress due to SOB. Stoic attitude towards her condition.  HEENT: Moist mucous membranes. Neck supple. No JVD. No adenopathies.  CV: Regular rate and rhythm, no murmurs rubs or gallops PULM: increased WOB. Wheezes present throughout both lung fields. No rales auscultaed ABD: Soft, non tender, non distended, normal bowel sounds EXT: No edema Neuro: Alert and oriented x3. No focalization  Psych: Normal mood and affect. Normal speech. Normal though process. No hallucinations/ delusions. No agitation.   Labs and Imaging: CBC BMET   Recent Labs Lab 03/31/13 1102  WBC 6.4  HGB 14.4  HCT 44.4  PLT 183    Recent Labs Lab 03/31/13 1102  NA 134*  K 4.1  CL 98  CO2 27  BUN 10  CREATININE 0.72  GLUCOSE 144*  CALCIUM 9.1      Beverely Low, MD  03/31/2013, 12:58 PM PGY-2 , [facility] FPTS Intern pager:  [phone], text pages welcome   Teaching Service Addendum. I have seen and evaluated this pt and agree with Dr. Cherre Huger  assessment and plan as is documented on this note. I have edited this note to reflect findings and changes on pt's management.   D. Piloto Rolene Arbour, MD Family Medicine  PGY- 3

## 2013-03-31 NOTE — Progress Notes (Signed)
Tried pt on CPAP. Pt could not tolerate the mask on her face.  Pt was very nervous and stated she felt closed in.  Pt was willing to try it again later when she gets ready for bed.

## 2013-03-31 NOTE — Progress Notes (Signed)
ABG newly resulted at 1945, reported to nurse by respiratory therapist. Updated MD on these results.

## 2013-03-31 NOTE — ED Notes (Signed)
Su, RN notified re: step down bed assignment

## 2013-04-01 DIAGNOSIS — J441 Chronic obstructive pulmonary disease with (acute) exacerbation: Secondary | ICD-10-CM

## 2013-04-01 DIAGNOSIS — J40 Bronchitis, not specified as acute or chronic: Secondary | ICD-10-CM

## 2013-04-01 DIAGNOSIS — J189 Pneumonia, unspecified organism: Secondary | ICD-10-CM

## 2013-04-01 DIAGNOSIS — R0989 Other specified symptoms and signs involving the circulatory and respiratory systems: Secondary | ICD-10-CM

## 2013-04-01 DIAGNOSIS — R0609 Other forms of dyspnea: Secondary | ICD-10-CM

## 2013-04-01 DIAGNOSIS — F411 Generalized anxiety disorder: Secondary | ICD-10-CM

## 2013-04-01 LAB — BASIC METABOLIC PANEL
Chloride: 103 mEq/L (ref 96–112)
GFR calc non Af Amer: 74 mL/min — ABNORMAL LOW (ref >90)
Glucose, Bld: 152 mg/dL — ABNORMAL HIGH (ref 70–99)
Potassium: 4.6 mEq/L (ref 3.5–5.1)
Sodium: 138 mEq/L (ref 135–145)

## 2013-04-01 LAB — CBC
HCT: 41.3 % (ref 36.0–46.0)
Hemoglobin: 13.2 g/dL (ref 12.0–15.0)
MCH: 30.3 pg (ref 26.0–34.0)
RBC: 4.35 MIL/uL (ref 3.87–5.11)

## 2013-04-01 LAB — TSH: TSH: 0.593 u[IU]/mL (ref 0.350–4.500)

## 2013-04-01 MED ORDER — ALBUTEROL SULFATE (5 MG/ML) 0.5% IN NEBU
2.5000 mg | INHALATION_SOLUTION | RESPIRATORY_TRACT | Status: DC
  Administered 2013-04-01 – 2013-04-03 (×14): 2.5 mg via RESPIRATORY_TRACT
  Filled 2013-04-01 (×15): qty 0.5

## 2013-04-01 NOTE — Progress Notes (Signed)
PULMONARY  / CRITICAL CARE MEDICINE  Name: Rachel Perez MRN: [id number] DOB: 1940/11/02    ADMISSION DATE:  03/31/2013 CONSULTATION DATE: 03/31/2013  REFERRING MD : Nestor Ramp, MD PRIMARY SERVICE:  [facility]  CHIEF COMPLAINT:  SOB  BRIEF PATIENT DESCRIPTION: Patient is 72 yrs old CF with significant smoking hx 40 pack/year. Now with SOB that started Tuesday last week . Now she has an abnormal ABG and we were consulted for further recommendation for possible BiPAP or MICU transfer .  SIGNIFICANT EVENTS / STUDIES:  ABG :  7.239, 40,981,19   LINES / TUBES: Telemetry monitoring   ANTIBIOTICS: Levaquin   HISTORY OF PRESENT ILLNESS:  Patient is 72 yrs old CF with hx of smoking 40 pack - year , still smokes, anxiety, COPD on Spiriva , Symbicort  ,  Pro-air , not on O2 , she is taking Wellbutrin trying to quit , up-todate with her vaccines, she started getting sick last Tuesday with a cold , progressive SOB , congestion , unable to sleep from the SOB , on Thursday she took benadryl to try to help her which resulted in worsening of the SOB , dry mouth , dry cough and inability to bring any thing up , she tried to quot smoking Thursday and now she feels very anxious .   Past Medical History  Diagnosis Date  . COPD (chronic obstructive pulmonary disease)    Past Surgical History  Procedure Laterality Date  . Abdominal hysterectomy      Prior to Admission medications   Medication Sig Start Date End Date Taking? Authorizing Provider  albuterol (PROAIR HFA) 108 (90 BASE) MCG/ACT inhaler Inhale 2 puffs into the lungs every 6 (six) hours as needed for wheezing.   Yes Historical Provider, MD  alendronate (FOSAMAX) 70 MG tablet Take 1 tablet (70 mg total) by mouth every 7 (seven) days. Take with a full glass of water on an empty stomach. 12/13/12  Yes Morrell Riddle, PA-C  ALPRAZolam Prudy Feeler) 0.5 MG tablet Take 1 tablet (0.5 mg total) by mouth at bedtime as needed. 12/13/12  Yes Morrell Riddle, PA-C  budesonide-formoterol (SYMBICORT) 160-4.5 MCG/ACT inhaler Inhale 2 puffs into the lungs 2 (two) times daily. 12/13/12  Yes Morrell Riddle, PA-C  buPROPion (WELLBUTRIN XL) 150 MG 24 hr tablet Take 150 mg by mouth daily.   Yes Historical Provider, MD  Calcium Carbonate (CALCIUM 500 PO) Take 500 Units by mouth 3 (three) times daily.   Yes Historical Provider, MD  Cholecalciferol (VITAMIN D) 2000 UNITS tablet Take 4,000 Units by mouth daily.    Yes Historical Provider, MD  ipratropium-albuterol (DUONEB) 0.5-2.5 (3) MG/3ML SOLN Take 3 mLs by nebulization 2 (two) times daily. 12/13/12  Yes Morrell Riddle, PA-C  tiotropium (SPIRIVA HANDIHALER) 18 MCG inhalation capsule Place 1 capsule (18 mcg total) into inhaler and inhale daily. 12/13/12  Yes Morrell Riddle, PA-C  triamcinolone cream (KENALOG) 0.1 % Apply topically 2 (two) times daily. 12/13/12   Morrell Riddle, PA-C   Allergies  Allergen Reactions  . Penicillins Other (See Comments)    psorasis    SUBJECTIVE:  VITAL SIGNS: Temp:  [97.5 F (36.4 C)-99.3 F (37.4 C)] 97.5 F (36.4 C) (07/20 0800) Pulse Rate:  [87-108] 95 (07/20 0345) Resp:  [17-29] 23 (07/20 0345) BP: (101-167)/(40-123) 129/66 mmHg (07/20 0345) SpO2:  [93 %-100 %] 98 % (07/20 0720) Weight:  [68 kg (149 lb 14.6 oz)-71.8 kg (158 lb 4.6 oz)] 68 kg (  149 lb 14.6 oz) (07/20 0345)  PHYSICAL EXAMINATION: General:  Anxious , in mild acute distress from SOB .  Neuro:  AXO3  HEENT:  Dry mucous membranes  Neck:  No Stridor , supple ,  Cardiovascular: NS1S2 no M/G/R Lungs: decrease air entry with bilateral wheezing , no crackles  Abdomen:  Soft lax +BS , no tenderness  Musculoskeletal:  No deformities  Skin:  No Rash   Recent Labs Lab 03/31/13 1102 04/01/13 0510  NA 134* 138  K 4.1 4.6  CL 98 103  CO2 27 28  BUN 10 14  CREATININE 0.72 0.80  GLUCOSE 144* 152*    Recent Labs Lab 03/31/13 1102 04/01/13 0510  HGB 14.4 13.2  HCT 44.4 41.3  WBC 6.4 6.7  PLT 183 175      Dg Chest 2 View  03/31/2013   *RADIOLOGY REPORT*  Clinical Data: Shortness of breath, history of emphysema  CHEST - 2 VIEW  Comparison: 06/20/2008  Findings: Hyperinflation consistent with COPD.  Heart size and vascular pattern are normal. Mild fluffy opacity in right hilar area.  No pleural effusions.  IMPRESSION: Early right middle lobe infiltrate cannot be excluded, which could represent pneumonia.   Original Report Authenticated By: Esperanza Heir, M.D.      ASSESSMENT / PLAN:  72 yrs old CF with severe COPD exacebation and respiratory failure .  -- COPD exacebation , from the hx patient has severe symptoms of cough and SOB for few days now , she started having that after URTI , at this point no need for the patient to be in the unit , she is stable on the stepdown, she will need to be on IV steroids given her severe presentation, Solumedrol 60 mg IV Q6 for 48-72 hrs then switch to PO prednisone 40 mg po daily and taper over 7-10 days, she will also need to be on IV ABX , Levaquin for atypical coverage , ?? RLL/RML infiltrate vs scarring , C/W Nebs Atrovent and Albuterol , stop Spiriva and Symbicort for now .  She is telling me she is up-to date with her vaccines. Counseling for quitting smoking was done and she is the process to quit .  -- Respiratory failure ,  Precipitated by COPD exacerbation , ?? PNA, no need to move to the unit now , she is able to talk in full sentences with intact MS , she is very anxious , Will give low dose Xanax and continue with Wellbutrin to try help her tolerate BiPAP , her ABG is improving but still not normalized, I discussed with her that the prednisone and the albuterol nebs treatment is making her anxiety worse at this point , Apply BiPAP as tolerated , patient wishes is to be DNR and DNI if she gets to the degree she needs to be resuscitated, that code status was changed in the orders .  - Rest of medical problems to be managed by primary team   -  Consider checking TSH level with her level of anxiety   - Hep SQ for DVT prophylaxis  - Check ABG if respiratory status gets worse , her last one was improved before the BiPAP trail      Elizbeth Posa M. Kriste Basque, MD Pulmonary Medicine 04/01/2013, 9:40 AM

## 2013-04-01 NOTE — Progress Notes (Signed)
Family Medicine Teaching Service Daily Progress Note Intern Pager: [phone]  Patient name: Rachel Perez Medical record number: [id number] Date of birth: 06-20-1941 Age: 72 y.o. Gender: female  Primary Care Provider: No primary provider on file. Consultants: CCM Code Status: DNI/ DNR  Pt Overview and Major Events to Date: Refused BIPAP yesterday. DNI/DNR  Assessment and Plan:  Rachel Perez is a 72 y.o. year old female presenting with worsening shortness of breath over the past 6 days as well as productive cough and wheezing. PMH is significant for COPD, osteoporosis, and anxiety.   # COPD exacerbation and PNA - cough/sob x6 days, now w/ CXR showing possible RML infiltrate and requiring 2-3L Rachel Perez after 2 nebs. ABG that showed respiratory acidosis.  - CCM consulted. Appreciate recommendations. - IV levaquin  - continue albuterol/ atrovent - solumedrol 60 mg Q6 for 48-72h . Then consider prednisone  40 mg PO daily with slow taper to 7-10 days  # Osteoporosis  - Will hold alendronate and vit d and as she improved will resume home regimen   # Anxiety  - continue home wellbutrin  - also has xanax 0.5 qhs prn at home, restart if needed   # Hx of Thyroid Disorder (?)  Normal TSH in April/2014. No on therapy for it.   FEN/GI: kvo fluids, NPO until respiratory status improves  Prophylaxis: SQ heparin  Disposition: pending improvement.   Subjective: feeling better  Objective: Temp:  [97.5 F (36.4 C)-99.3 F (37.4 C)] 97.5 F (36.4 C) (07/20 0800) Pulse Rate:  [87-108] 95 (07/20 0345) Resp:  [16-29] 23 (07/20 0345) BP: (101-167)/(40-123) 129/66 mmHg (07/20 0345) SpO2:  [69 %-100 %] 98 % (07/20 0720) Weight:  [149 lb 14.6 oz (68 kg)-165 lb (74.844 kg)] 149 lb 14.6 oz (68 kg) (07/20 0345) Physical Exam: General: mild to moderated distress due to SOB. Stoic attitude towards her condition.  HEENT: Moist mucous membranes. Neck supple. No JVD. No adenopathies.  CV: Regular rate  and rhythm, no murmurs rubs or gallops  PULM:  Wheezes present throughout both lung fields. No rales auscultated. No much change from yesterday.  ABD: Soft, non tender, non distended, normal bowel sounds  EXT: No edema  Neuro: Alert and oriented x3. No focalization  Psych: Normal mood and affect. Normal speech. Normal though process. No hallucinations/ delusions. No agitation  Laboratory:  Recent Labs Lab 03/31/13 1102 04/01/13 0510  WBC 6.4 6.7  HGB 14.4 13.2  HCT 44.4 41.3  PLT 183 175    Recent Labs Lab 03/31/13 1102 04/01/13 0510  NA 134* 138  K 4.1 4.6  CL 98 103  CO2 27 28  BUN 10 14  CREATININE 0.72 0.80  CALCIUM 9.1 9.3  GLUCOSE 144* 152*   Imaging/Diagnostic Tests: CXR 03/31/2013 IMPRESSION:  Early right middle lobe infiltrate cannot be excluded, which could represent pneumonia.  Adolphe Fortunato Piloto de Criselda Peaches, MD 04/01/2013, 8:44 AM PGY-3, [place] FPTS Intern pager: [phone], text pages welcome

## 2013-04-01 NOTE — Progress Notes (Signed)
Pt does not wish to wear Bipap. Pt becomes very anxious and cannot tolerate mask. Pt attempted to wear last night for approximately one hour. Pt tolerating N/C well. Family at bedside.

## 2013-04-01 NOTE — Progress Notes (Signed)
Pt wanted to take Bipap mask off. Family at bedside. RN aware.

## 2013-04-01 NOTE — Progress Notes (Signed)
FMTS Attending Daily Note: Marga Gramajo MD [phone] pager office [phone] I  have seen and examined this patient, reviewed their chart. I have discussed this patient with the resident. I agree with the resident's findings, assessment and care plan. 

## 2013-04-01 NOTE — Progress Notes (Signed)
FMTS interval progress note  Went to see how patient was doing. States she is doing well. Feels better than yesterday. Was able to tolerate about 1.5 hours of BiPAP last night. Now states is breathing better. Vital signs were stable while we were in the room and the patient appeared to be breathing comfortably.  Marikay Alar, MD PGY2 FMTS

## 2013-04-02 ENCOUNTER — Inpatient Hospital Stay (HOSPITAL_COMMUNITY): Payer: Medicare Other

## 2013-04-02 DIAGNOSIS — J9602 Acute respiratory failure with hypercapnia: Secondary | ICD-10-CM | POA: Diagnosis present

## 2013-04-02 DIAGNOSIS — J449 Chronic obstructive pulmonary disease, unspecified: Secondary | ICD-10-CM

## 2013-04-02 MED ORDER — LEVOFLOXACIN 750 MG PO TABS
750.0000 mg | ORAL_TABLET | Freq: Every day | ORAL | Status: DC
  Administered 2013-04-02 – 2013-04-03 (×2): 750 mg via ORAL
  Filled 2013-04-02 (×4): qty 1

## 2013-04-02 MED ORDER — PREDNISONE 50 MG PO TABS
50.0000 mg | ORAL_TABLET | Freq: Every day | ORAL | Status: DC
  Administered 2013-04-03: 50 mg via ORAL
  Filled 2013-04-02 (×3): qty 1

## 2013-04-02 MED ORDER — BUDESONIDE-FORMOTEROL FUMARATE 160-4.5 MCG/ACT IN AERO
2.0000 | INHALATION_SPRAY | Freq: Two times a day (BID) | RESPIRATORY_TRACT | Status: DC
  Administered 2013-04-02: 2 via RESPIRATORY_TRACT
  Filled 2013-04-02: qty 6

## 2013-04-02 NOTE — Progress Notes (Signed)
Family Medicine Teaching Service Daily Progress Note Intern Pager: [phone]  Patient name: Rachel Perez Medical record number: [id number] Date of birth: 04-22-1941 Age: 72 y.o. Gender: female  Primary Care Provider: No primary provider on file. Consultants: CCM Code Status: DNI/ DNR  Pt Overview and Major Events to Date:   7/19: Admitted for SOB. ABG pH: 7.1. Refused BiPAP. DNI/DNR 7/20: Tolerated 1.5hrs BiPAP with ABG improvement 7/21: Saturating well with 2L [place], at baseline.   Assessment and Plan:  Rachel Perez is a 72 y.o. year old female presenting with worsening shortness of breath over the past 6 days as well as productive cough and wheezing. PMH is significant for COPD, asthma, osteoporosis, and anxiety.   COPD exacerbation and PNA - cough/sob x6 days, now w/ CXR showing possible RML infiltrate and requiring 2-3L [place] after 2 nebs. ABG that showed respiratory acidosis.  - pH overnight improved to 7.239, hypercapnia stable 65.4 - CCM consulted. Appreciate recommendations. - IV levaquin  - continue albuterol/ atrovent - CXR improved today - solumedrol 60 mg Q6 for 48-72h . Then consider prednisone  40 mg PO daily with slow taper to 7-10 days  Osteoporosis  - Will hold alendronate and vit D and as she improved will resume home regimen   Anxiety  - continued xanax 0.5 qhs prn  Tobacco use - continue home wellbutrin   Hx of Thyroid Disorder  Normal TSH in [id number] and now (0.593). No home therapy.   FEN/GI: SLIV, clear liquids Prophylaxis: SQ heparin  Disposition: SDU to floor pending clinical improvement  Subjective: Pt feels at baseline today, with SOB with minimal exertion. No CP. Thinks she is ready to leave.   Objective: Temp:  [97.5 F (36.4 C)-98.5 F (36.9 C)] 97.7 F (36.5 C) (07/21 0823) Pulse Rate:  [78-93] 80 (07/21 0823) Resp:  [17-25] 18 (07/21 0823) BP: (114-143)/(40-56) 126/40 mmHg (07/21 0823) SpO2:  [92 %-100 %] 92 % (07/21  0823) Weight:  [160 lb 15 oz (73 kg)] 160 lb 15 oz (73 kg) (07/21 0400) Physical Exam: General: Sitting on side of bed in NAD conversing with daughter. HEENT: Moist mucous membranes. Neck supple. No JVD. No adenopathies.  CV: Regular rate and rhythm, no murmurs rubs or gallops  PULM: Minimal end-expiratory wheezing noted intermittently. No rales auscultated. Non-labored.  ABD: Soft, non tender, non distended, normal bowel sounds  EXT: No edema  Neuro: Alert and oriented x3. No focalization  Psych: Normal mood and affect. Normal speech. Normal though process. No hallucinations/ delusions. No agitation  Laboratory:  Recent Labs Lab 03/31/13 1102 04/01/13 0510  WBC 6.4 6.7  HGB 14.4 13.2  HCT 44.4 41.3  PLT 183 175    Recent Labs Lab 03/31/13 1102 04/01/13 0510  NA 134* 138  K 4.1 4.6  CL 98 103  CO2 27 28  BUN 10 14  CREATININE 0.72 0.80  CALCIUM 9.1 9.3  GLUCOSE 144* 152*   Imaging/Diagnostic Tests: CXR 03/31/2013 IMPRESSION:  Early right middle lobe infiltrate cannot be excluded, which could represent pneumonia.  CXR 04/01/13 IMPRESSION:  No focal infiltrate to suggest acute infectious pneumonitis  identified. Previously questioned right middle lobe infiltrate is  less apparent on today's study.  Hazeline Junker, MD 04/02/2013, 8:32 AM PGY-1, [facility] Family Medicine FPTS Intern pager: [phone], text pages welcome

## 2013-04-02 NOTE — Progress Notes (Signed)
PULMONARY  / CRITICAL CARE MEDICINE  Name: Rachel Perez MRN: [id number] DOB: 1941-05-02    ADMISSION DATE:  03/31/2013 CONSULTATION DATE: 03/31/2013  REFERRING MD : Nestor Ramp, MD PRIMARY SERVICE:  [facility]  CHIEF COMPLAINT:  SOB  BRIEF PATIENT DESCRIPTION: Patient is 72 yrs old CF with significant smoking hx 40 pack/year. Presenting SOB that started Tuesday last week . Now she had abnormal ABG and we were consulted for further recommendation for possible BiPAP or MICU transfer.  SIGNIFICANT EVENTS / STUDIES:  ABG :  7.239, 65,104,98 on 7/21  LINES / TUBES: Telemetry monitoring   ANTIBIOTICS: Levaquin >>>   HISTORY OF PRESENT ILLNESS:  Patient is 72 yrs old CF with hx of smoking 40 pack - year , still smokes, anxiety, COPD on Spiriva , Symbicort  ,  Pro-air , not on O2 , she is taking Wellbutrin trying to quit , up-todate with her vaccines, she started getting sick last Tuesday with a cold , progressive SOB , congestion , unable to sleep from the SOB , on Thursday she took benadryl to try to help her which resulted in worsening of the SOB , dry mouth , dry cough and inability to bring any thing up , she tried to quit smoking Thursday and now she feels very anxious .   Past Medical History  Diagnosis Date  . COPD (chronic obstructive pulmonary disease)    Past Surgical History  Procedure Laterality Date  . Abdominal hysterectomy      Prior to Admission medications   Medication Sig Start Date End Date Taking? Authorizing Provider  albuterol (PROAIR HFA) 108 (90 BASE) MCG/ACT inhaler Inhale 2 puffs into the lungs every 6 (six) hours as needed for wheezing.   Yes Historical Provider, MD  alendronate (FOSAMAX) 70 MG tablet Take 1 tablet (70 mg total) by mouth every 7 (seven) days. Take with a full glass of water on an empty stomach. 12/13/12  Yes Morrell Riddle, PA-C  ALPRAZolam Prudy Feeler) 0.5 MG tablet Take 1 tablet (0.5 mg total) by mouth at bedtime as needed. 12/13/12  Yes  Morrell Riddle, PA-C  budesonide-formoterol (SYMBICORT) 160-4.5 MCG/ACT inhaler Inhale 2 puffs into the lungs 2 (two) times daily. 12/13/12  Yes Morrell Riddle, PA-C  buPROPion (WELLBUTRIN XL) 150 MG 24 hr tablet Take 150 mg by mouth daily.   Yes Historical Provider, MD  Calcium Carbonate (CALCIUM 500 PO) Take 500 Units by mouth 3 (three) times daily.   Yes Historical Provider, MD  Cholecalciferol (VITAMIN D) 2000 UNITS tablet Take 4,000 Units by mouth daily.    Yes Historical Provider, MD  ipratropium-albuterol (DUONEB) 0.5-2.5 (3) MG/3ML SOLN Take 3 mLs by nebulization 2 (two) times daily. 12/13/12  Yes Morrell Riddle, PA-C  tiotropium (SPIRIVA HANDIHALER) 18 MCG inhalation capsule Place 1 capsule (18 mcg total) into inhaler and inhale daily. 12/13/12  Yes Morrell Riddle, PA-C  triamcinolone cream (KENALOG) 0.1 % Apply topically 2 (two) times daily. 12/13/12   Morrell Riddle, PA-C   Allergies  Allergen Reactions  . Penicillins Other (See Comments)    psorasis    SUBJECTIVE: Patient AOX3, sitting on bed in NAD. Daughter at beside.  VITAL SIGNS: Temp:  [97.5 F (36.4 C)-98.5 F (36.9 C)] 97.7 F (36.5 C) (07/21 0823) Pulse Rate:  [78-93] 80 (07/21 0823) Resp:  [17-25] 18 (07/21 0823) BP: (114-143)/(40-56) 126/40 mmHg (07/21 0823) SpO2:  [92 %-100 %] 92 % (07/21 0823) Weight:  [160 lb 15 oz (  73 kg)] 160 lb 15 oz (73 kg) (07/21 0400)  PHYSICAL EXAMINATION: General:  Mildly anxious, in NAD this AM  Neuro:  AXO3  HEENT: Moist mucus membranes Neck:  No Stridor , supple , no thyromegaly   Cardiovascular: NS1S2 no M/G/R Lungs: CTAB, no expiratory wheezes.  Abdomen:  Soft lax +BS , no tenderness  Musculoskeletal:  No deformities  Skin:  No Rash   Recent Labs Lab 03/31/13 1102 04/01/13 0510  NA 134* 138  K 4.1 4.6  CL 98 103  CO2 27 28  BUN 10 14  CREATININE 0.72 0.80  GLUCOSE 144* 152*    Recent Labs Lab 03/31/13 1102 04/01/13 0510  HGB 14.4 13.2  HCT 44.4 41.3  WBC 6.4 6.7  PLT  183 175    Dg Chest 2 View  03/31/2013   *RADIOLOGY REPORT*  Clinical Data: Shortness of breath, history of emphysema  CHEST - 2 VIEW  Comparison: 06/20/2008  Findings: Hyperinflation consistent with COPD.  Heart size and vascular pattern are normal. Mild fluffy opacity in right hilar area.  No pleural effusions.  IMPRESSION: Early right middle lobe infiltrate cannot be excluded, which could represent pneumonia.   Original Report Authenticated By: Esperanza Heir, M.D.   Dg Chest Port 1 View  04/02/2013   *RADIOLOGY REPORT*  Clinical Data: Evaluate for infiltrates  PORTABLE CHEST - 1 VIEW  Comparison: Prior radiograph from 03/31/2013  Findings: The cardiac and mediastinal silhouettes are stable in size and contour, and remain within normal limits.  The lungs are normally inflated.  No new focal airspace consolidation to suggest an acute infectious pneumonitis is identified.  Previously questioned right middle lobe infiltrate is less apparent on today's study.  Minimal bibasilar atelectasis is present.  No pulmonary edema or pleural effusion.  No pneumothorax.  Osseous structures are unchanged.  IMPRESSION: No focal infiltrate to suggest acute infectious pneumonitis identified.  Previously questioned right middle lobe infiltrate is less apparent on today's study.   Original Report Authenticated By: Rise Mu, M.D.       ASSESSMENT / PLAN:  72 yrs old CF with severe COPD exacebation and respiratory failure.  -- COPD exacebation , Pt has remained stable on stepdown unit.  She has improved clinically on IV Solumedrol, consider switch to PO prednisone 40 mg po daily and taper over 7-10 days.  RLL/RML infiltrate less apparent on CXR this AM. Continue Levaquin abx. Establish stop date.  C/W Nebs Atrovent and Albuterol , stop Spiriva and Symbicort for now . Patient reports UTD with vaccines. Counseling on smoking cessation has been performed. Continue Wellbutrin for smoking cessation.   --  Respiratory failure ,  Precipitated by COPD exacerbation and possible RLL/RML PNA.  Patient remained stable on Stepdown with no need for transfer to MICU. She is anxious but controlled with low dose zanex and Wellbutrin. She tolerated BiPAP for 1 hr 15 minutes last night. Pt is aware that allbuterol nebs and prednisone can increase her anxiety.  Apply BiPAP as tolerated , patient wishes is to be DNR and DNI if she gets to the degree she needs to be resuscitated, that code status was changed in the orders .  - Rest of medical problems to be managed by primary team   - TSH level checked and is normal at 0.593  - Continue Hep SQ for DVT prophylaxis.  - PCCM signing off, please call back if needed.  - Arrange for F/U with PCCM as outpatient.   Jennifer Little PA-S  Patient seen and examined, agree with above note.  I dictated the care and orders written for this patient under my direction.  Alyson Reedy, MD [phone]  04/02/2013, 9:16 AM

## 2013-04-02 NOTE — Progress Notes (Signed)
Seen and examined.  Agree with Dr. Jarvis Newcomer.  Still tight on exam with new O2 requirement.  Needs continued hospitalization.

## 2013-04-03 DIAGNOSIS — J9600 Acute respiratory failure, unspecified whether with hypoxia or hypercapnia: Secondary | ICD-10-CM

## 2013-04-03 MED ORDER — LEVOFLOXACIN 750 MG PO TABS: 750.0000 mg | ORAL_TABLET | Freq: Every day | ORAL | Status: DC

## 2013-04-03 NOTE — Evaluation (Signed)
Occupational Therapy Evaluation Patient Details Name: Reesa Gotschall MRN: [id number] DOB: [birth year] Today's Date: 04/03/2013 Time: 7829-5621 OT Time Calculation (min): 32 min  OT Assessment / Plan / Recommendation History of present illness 72 yrs old CF with severe COPD exacebation and respiratory failure   Clinical Impression   Presents to OT today with below impairments (see OT problem list). Will benefit from occupational therapy in the acute setting to maximize activity tolerance prior to d/c. Education provided to patient on energy conservation. Patient plans to stay with son and granddaughter while she is recovering.      OT Assessment  Patient needs continued OT Services    Follow Up Recommendations  Home health OT;Supervision/Assistance - 24 hour    Barriers to Discharge      Equipment Recommendations  Toilet rise with handles    Recommendations for Other Services    Frequency  Min 2X/week    Precautions / Restrictions Precautions Precautions: Fall Restrictions Weight Bearing Restrictions: No   Pertinent Vitals/Pain No pain reported.     ADL  Eating/Feeding: Independent Where Assessed - Eating/Feeding: Chair Upper Body Bathing: Supervision/safety Where Assessed - Upper Body Bathing: Unsupported sitting Lower Body Bathing: Supervision/safety Where Assessed - Lower Body Bathing: Unsupported sit to stand Upper Body Dressing: Supervision/safety Where Assessed - Upper Body Dressing: Unsupported sitting Lower Body Dressing: Supervision/safety Where Assessed - Lower Body Dressing: Unsupported sit to stand Toilet Transfer: Simulated;Supervision/safety;Min guard Toilet Transfer Method: Sit to Barista: Regular height toilet;Comfort height toilet (minguard-regular height and supervision-comfort height) Toileting - Clothing Manipulation and Hygiene: Supervision/safety Where Assessed - Engineer, mining and Hygiene: Sit to stand  from 3-in-1 or toilet Tub/Shower Transfer: Simulated;Supervision/safety Tub/Shower Transfer Method: Science writer: Walk in shower Equipment Used: Gait belt;Other (comment) (oxygen) Transfers/Ambulation Related to ADLs: Supervision for ambulation. Supervision for transfers/minguard from simulated regular height comode ADL Comments: OT educated on energy conservation techniques and provided handout. Pt practiced simulated shower transfer as well as regular height toilet transfer. More difficult to rise/sit on simulated regular height- OT recommended toilet riser and pt agreeable.  Educated to have granddaughter with pt at all times.    OT Diagnosis: Generalized weakness  OT Problem List: Decreased activity tolerance;Decreased knowledge of use of DME or AE;Cardiopulmonary status limiting activity OT Treatment Interventions: Energy conservation;DME and/or AE instruction;Patient/family education;Self-care/ADL training   OT Goals(Current goals can be found in the care plan section) Acute Rehab OT Goals Patient Stated Goal: home, breathe, get stronger OT Goal Formulation: With patient Time For Goal Achievement: 04/10/13 Potential to Achieve Goals: Good ADL Goals Additional ADL Goal #1: Pt will be able to verbalize and demonstrate 3/3 energy conservation techniques during ADLs.   Visit Information  Last OT Received On: 04/03/13 Assistance Needed: +1 History of Present Illness: 72 yrs old CF with severe COPD exacebation and respiratory failure       Prior Functioning     Home Living Family/patient expects to be discharged to:: Private residence Living Arrangements: Children;Other relatives Available Help at Discharge: Available 24 hours/day Type of Home: House Home Access: Stairs to enter [employer] of Steps: 3-4 Entrance Stairs-Rails: None Home Layout: One level Home Equipment: None Prior Function Level of Independence:  Independent Communication Communication: No difficulties         Vision/Perception     Cognition  Cognition Arousal/Alertness: Awake/alert Behavior During Therapy: WFL for tasks assessed/performed Overall Cognitive Status: Within Functional Limits for tasks assessed    Extremity/Trunk Assessment Upper Extremity Assessment  Upper Extremity Assessment: Overall WFL for tasks assessed Lower Extremity Assessment Lower Extremity Assessment: Overall WFL for tasks assessed     Mobility Bed Mobility Bed Mobility: Not assessed Transfers Transfers: Sit to Stand;Stand to Sit Sit to Stand: 5: Supervision;4: Min guard;With upper extremity assist;From chair/3-in-1;From toilet Stand to Sit: 5: Supervision;4: Min guard;With upper extremity assist;To chair/3-in-1;To toilet Details for Transfer Assistance: Minguard from simulated regular height toilet.            End of Session OT - End of Session Equipment Utilized During Treatment: Gait belt;Oxygen Activity Tolerance: Patient tolerated treatment well Patient left: in chair;with call bell/phone within reach;with family/visitor present Nurse Communication: Other (comment) (need for equipment)  GO     Earlie Raveling  OTR/L [phone]  04/03/2013, 5:45 PM

## 2013-04-03 NOTE — Progress Notes (Signed)
Family Medicine Teaching Service Daily Progress Note Intern Pager: [phone]  Patient name: Rachel Perez Medical record number: [id number] Date of birth: 02/15/1941 Age: 72 y.o. Gender: female  Primary Care Provider: No primary provider on file. Consultants: CCM  Code Status: DNI/ DNR  Pt Overview and Major Events to Date:   7/19: Admitted for SOB. ABG pH: 7.1. Refused BiPAP. DNI/DNR 7/20: Tolerated 1.5hrs BiPAP with ABG improvement 7/21: Saturating well with 2L [place], at baseline.  7/22: To walk-test today  Assessment and Plan:  Rachel Perez is a 72 y.o. year old female presenting with worsening shortness of breath over the past 6 days as well as productive cough and wheezing. PMH is significant for COPD, asthma, osteoporosis, and anxiety.   COPD exacerbation and PNA - cough/sob x6 days, now w/ CXR showing possible RML infiltrate and requiring 2-3L [place] after 2 nebs. ABG that showed respiratory acidosis.  - CCM signed off - IV levaquin (7/19) -> PO levaquin (7/21) - continue albuterol/ atrovent - Solumedrol 60 mg Q6 -> Prednisone 40 mg PO daily (7/21) with slow taper 7-10 days  Osteoporosis  - Will hold alendronate and vit D and as she improved will resume home regimen   Anxiety  - continued xanax 0.5 qhs prn  Tobacco use - continue home wellbutrin   Hx of Thyroid Disorder  Normal TSH in [id number] and now (0.593). No home therapy.   FEN/GI: SLIV, heart healthy Prophylaxis: SQ heparin  Disposition: Pending walk-test without desaturation  Subjective: Still with SOB with minimal exertion. No CP. Feels ready to walk-test today.   Objective: Temp:  [97.5 F (36.4 C)-98.6 F (37 C)] 97.5 F (36.4 C) (07/22 0500) Pulse Rate:  [65-87] 65 (07/22 0500) Resp:  [16-30] 16 (07/22 0500) BP: (99-126)/(40-84) 99/49 mmHg (07/22 0500) SpO2:  [92 %-100 %] 100 % (07/22 0500) Weight:  [144 lb 10 oz (65.6 kg)] 144 lb 10 oz (65.6 kg) (07/22 0500) Physical Exam: General: Sitting in  chair in NAD conversing with daughter. HEENT: Moist mucous membranes. Neck supple. No JVD. No adenopathies.  CV: Regular rate and rhythm, no murmurs rubs or gallops  PULM: Intermittent wheezes L > R. No rales auscultated. Non-labored.  ABD: Soft, non tender, non distended, normal bowel sounds  EXT: No edema Moves all extremeties spontaneously.  Neuro: Alert and oriented x3. No focalization  Psych: Normal mood and affect. Normal speech. Normal though process. No hallucinations/ delusions. No agitation  Laboratory:  Recent Labs Lab 03/31/13 1102 04/01/13 0510  WBC 6.4 6.7  HGB 14.4 13.2  HCT 44.4 41.3  PLT 183 175    Recent Labs Lab 03/31/13 1102 04/01/13 0510  NA 134* 138  K 4.1 4.6  CL 98 103  CO2 27 28  BUN 10 14  CREATININE 0.72 0.80  CALCIUM 9.1 9.3  GLUCOSE 144* 152*   Imaging/Diagnostic Tests: CXR 03/31/2013 IMPRESSION:  Early right middle lobe infiltrate cannot be excluded, which could represent pneumonia.  CXR 04/01/13 IMPRESSION:  No focal infiltrate to suggest acute infectious pneumonitis  identified. Previously questioned right middle lobe infiltrate is  less apparent on today's study.  Hazeline Junker, MD 04/03/2013, 7:26 AM PGY-1, [facility] Family Medicine FPTS Intern pager: [phone], text pages welcome

## 2013-04-03 NOTE — Progress Notes (Signed)
04-03-13 Face sheet information confirmed with patient . However, at discharge patient will be staying at her son's for " a few days " [address] , [place] [address] Patient's cell phone is [phone] . List of Home health agencies provided to patient , patient would like [facility].   Ronny Flurry  RN BSN [phone]

## 2013-04-03 NOTE — Progress Notes (Signed)
Utilization review completed. Juel Bellerose RN CCM Case Mgmt phone [phone] 

## 2013-04-03 NOTE — Progress Notes (Signed)
Seen and examined.  Agree with Dr. Jarvis Newcomer.  OK for DC today.

## 2013-04-03 NOTE — Discharge Planning (Signed)
Patient discharged home in stable condition. Verbalizes understanding of all discharge instructions, including home medications and follow up appointments. 

## 2013-04-03 NOTE — Discharge Summary (Signed)
Seen and examined.  OK for DC today as per Dr. Jarvis Newcomer

## 2013-04-03 NOTE — Evaluation (Signed)
Physical Therapy Evaluation Patient Details Name: Rachel Perez MRN: [id number] DOB: July 05, 1941 Today's Date: 04/03/2013 Time: 1420-1440 PT Time Calculation (min): 20 min  PT Assessment / Plan / Recommendation History of Present Illness  72 yrs old CF with severe COPD exacebation and respiratory failure  Clinical Impression  Presents to PT today with below impairments (see PT problem list) impacting her functional independence. Will benefit physical therapy in the acute setting to maximize strength and activity tolerance for return home with support from family and further PT treatment with HHPT. Education provided to patient and granddaughter on importance of gradual increase back to activity as well as tips on home management with regards to energy conservation. Patient plans to stay with son and granddaughter while she is recovering.     PT Assessment  Patient needs continued PT services    Follow Up Recommendations  Home health PT    Does the patient have the potential to tolerate intense rehabilitation      Barriers to Discharge        Equipment Recommendations  None recommended by PT    Recommendations for Other Services     Frequency Min 3X/week    Precautions / Restrictions Precautions Precautions: Fall Restrictions Weight Bearing Restrictions: No   Pertinent Vitals/Pain SaO2 at rest on RA 92%, during ambulation she dropped to 79% requiring seated rest, pursed lip breathing and O2 at 2 liters to increase back to 94%      Mobility  Bed Mobility Bed Mobility: Not assessed Transfers Transfers: Sit to Stand;Stand to Sit Sit to Stand: 5: Supervision;With upper extremity assist;From chair/3-in-1 Stand to Sit: 5: Supervision;With upper extremity assist;To chair/3-in-1 Ambulation/Gait Ambulation/Gait Assistance: 5: Supervision Ambulation Distance (Feet): 60 Feet Assistive device: None Ambulation/Gait Assistance Details: supervision for safety, pt became  increasingly dyspneic (3-4/4) when attempting ambulation on RA with SpO2 dropping to as low as 79% requring seated rest, when she started getting out of breath she became more anxious an impulsive requiring close supervision  Gait Pattern: Within Functional Limits Gait velocity: decreased Stairs: No    Exercises Other Exercises Other Exercises: pursed lip breathing x 10 following ambulation to recover   PT Diagnosis: Difficulty walking  PT Problem List: Decreased activity tolerance;Cardiopulmonary status limiting activity PT Treatment Interventions: Gait training;Functional mobility training;Therapeutic activities;Therapeutic exercise;Patient/family education     PT Goals(Current goals can be found in the care plan section) Acute Rehab PT Goals Patient Stated Goal: home, breathe, get stronger PT Goal Formulation: With patient Time For Goal Achievement: 04/10/13 Potential to Achieve Goals: Good  Visit Information  Last PT Received On: 04/03/13 Assistance Needed: +1 History of Present Illness: 72 yrs old CF with severe COPD exacebation and respiratory failure       Prior Functioning  Home Living Family/patient expects to be discharged to:: Private residence Living Arrangements: Children;Other relatives Available Help at Discharge: Available 24 hours/day Type of Home: House Home Access: Stairs to enter [employer] of Steps: 3-4 Entrance Stairs-Rails: None Home Layout: One level Home Equipment: None Prior Function Level of Independence: Independent Communication Communication:  (dyspnea)    Cognition  Cognition Arousal/Alertness: Awake/alert Behavior During Therapy: WFL for tasks assessed/performed Overall Cognitive Status: Within Functional Limits for tasks assessed    Extremity/Trunk Assessment Upper Extremity Assessment Upper Extremity Assessment: Defer to OT evaluation Lower Extremity Assessment Lower Extremity Assessment: Overall WFL for tasks assessed    Balance Balance Balance Assessed: Yes Static Standing Balance Static Standing - Balance Support: No upper extremity supported Static Standing -  Level of Assistance: 7: Independent  End of Session PT - End of Session Equipment Utilized During Treatment: Gait belt;Oxygen Activity Tolerance: Patient tolerated treatment well Patient left: in chair;with call bell/phone within reach Nurse Communication: Mobility status  GP     [facility] HELEN 04/03/2013, 2:48 PM

## 2013-04-03 NOTE — Discharge Planning (Signed)
SATURATION QUALIFICATIONS: (This note is used to comply with regulatory documentation for home oxygen)  Patient Saturations on Room Air at Rest = 79%  Patient Saturations on Room Air while Ambulating = 87%  Patient Saturations on 2 Liters of oxygen while Ambulating = 90%  Please briefly explain why patient needs home oxygen: copd

## 2013-04-03 NOTE — Discharge Summary (Signed)
Family Medicine Teaching [facility] Discharge Summary  Patient name: Rachel Perez Medical record number: [id number] Date of birth: [birth year] Age: 72 y.o. Gender: female Date of Admission: 03/31/2013  Date of Discharge: 04/03/2013 Admitting Physician: Nestor Ramp, MD  Primary Care Provider: No primary provider on file. Consultants: Pulmonology  Indication for Hospitalization: COPD Exacerbation  Discharge Diagnoses/Problem List:   DNR/DNI Acute respiratory failure COPD Anxiety Tobacco Use Osteoporosis H/o thyroid disorder  Disposition: D/C to son's home with home oxygen  Discharge Condition: Stable  Brief Hospital Course:  Rachel Perez is a 72 y.o. year old female presenting with worsening shortness of breath over the past 6 days as well as productive cough and wheezing.   COPD exacerbation CXR showing possible RML infiltrate and requiring 2-3L [place] after 2 nebs. ABG showed respiratory acidosis that was mildly improved after 1 hr of BiPAP.  Pt became highly anxious and refused further use of BiPAP and voiced desire to not be intubated or resuscitated in the event of decompensation.  She slowly improved over the next 2 days on 2-3L by [place]. She received albuterol q2h on day of admission, then q4h. Also received symbicort, atrovent, and levaquin IV (7/19-7/20), then PO (7/21-7/22), and solumedrol transitioned to prednisone on day of discharge. She was successfully ambulated on the 22nd and discharged with home oxygen, 7-day course of levaquin po, and follow-up with Dr. Paulina Fusi at [facility].   Tobacco Use  Pt not ready to quit at this time.   Osteoporosis  Did not order alendronate or vitamin D during hospitalization, was discharged on home medications.   Anxiety  We continued home bupropion and xanax 0.5mg  qHS.   History of Thyroid Disorder Normal TSH at admission, so no treatment was begun.     Issues for Follow Up:  1) Reassess pulmonary function and  need for home oxygen  Significant Procedures: None  Significant Labs and Imaging:   Recent Labs Lab 03/31/13 1102 04/01/13 0510  WBC 6.4 6.7  HGB 14.4 13.2  HCT 44.4 41.3  PLT 183 175    Recent Labs Lab 03/31/13 1102 04/01/13 0510  NA 134* 138  K 4.1 4.6  CL 98 103  CO2 27 28  GLUCOSE 144* 152*  BUN 10 14  CREATININE 0.72 0.80  CALCIUM 9.1 9.3   ABG (admission): pH 7.165 / CO2 68.5 / O2 95 / Bicarb 24.6 / BE -6 / 94.0% ABG s/p 1hr BiPAP: pH 7.239 / CO2 65.4 / O2 104 / Bicarb27 / BE -2.4 / 98.5% Tn I: negative TSH 0.593  Outstanding Results: None  Discharge Medications:    Medication List         alendronate 70 MG tablet  Commonly known as:  FOSAMAX  Take 1 tablet (70 mg total) by mouth every 7 (seven) days. Take with a full glass of water on an empty stomach.     ALPRAZolam 0.5 MG tablet  Commonly known as:  XANAX  Take 1 tablet (0.5 mg total) by mouth at bedtime as needed.     budesonide-formoterol 160-4.5 MCG/ACT inhaler  Commonly known as:  SYMBICORT  Inhale 2 puffs into the lungs 2 (two) times daily.     buPROPion 150 MG 24 hr tablet  Commonly known as:  WELLBUTRIN XL  Take 150 mg by mouth daily.     CALCIUM 500 PO  Take 500 Units by mouth 3 (three) times daily.     ipratropium-albuterol 0.5-2.5 (3) MG/3ML Soln  Commonly known  as:  DUONEB  Take 3 mLs by nebulization 2 (two) times daily.     levofloxacin 750 MG tablet  Commonly known as:  LEVAQUIN  Take 1 tablet (750 mg total) by mouth daily.     PROAIR HFA 108 (90 BASE) MCG/ACT inhaler  Generic drug:  albuterol  Inhale 2 puffs into the lungs every 6 (six) hours as needed for wheezing.     tiotropium 18 MCG inhalation capsule  Commonly known as:  SPIRIVA HANDIHALER  Place 1 capsule (18 mcg total) into inhaler and inhale daily.     triamcinolone cream 0.1 %  Commonly known as:  KENALOG  Apply topically 2 (two) times daily.     Vitamin D 2000 UNITS tablet  Take 4,000 Units by mouth  daily.        Discharge Instructions: Please refer to Patient Instructions section of EMR for full details.  Patient was counseled important signs and symptoms that should prompt return to medical care, changes in medications, dietary instructions, activity restrictions, and follow up appointments.   Follow-Up Appointments: Follow-up Information   Follow up with Gildardo Cranker, DO On 04/11/2013. (3:45PM)    Contact information:   [address] [place] [address] [phone]       Hazeline Junker, MD 04/03/2013, 2:47 PM PGY-1, [facility]

## 2013-04-06 LAB — CULTURE, BLOOD (ROUTINE X 2): Culture: NO GROWTH

## 2013-04-09 ENCOUNTER — Telehealth: Payer: Self-pay | Admitting: Family Medicine

## 2013-04-09 NOTE — Telephone Encounter (Signed)
Heather from [facility] to state that Zariyah missed one appointment, Herbert Seta called her and said that Chyrl Civatte said she was fine and feeling better than ever. She did not have any problems at all. JW

## 2013-04-11 ENCOUNTER — Encounter: Payer: Self-pay | Admitting: Family Medicine

## 2013-04-11 ENCOUNTER — Ambulatory Visit (INDEPENDENT_AMBULATORY_CARE_PROVIDER_SITE_OTHER): Payer: Medicare Other | Admitting: Family Medicine

## 2013-04-11 VITALS — BP 124/59 | HR 84 | Temp 98.2°F | Ht 67.0 in | Wt 159.0 lb

## 2013-04-11 DIAGNOSIS — J449 Chronic obstructive pulmonary disease, unspecified: Secondary | ICD-10-CM

## 2013-04-11 NOTE — Progress Notes (Signed)
Rachel Perez is a 72 y.o. female who presents today for hospital f/u for acute respiratory failure/COPD exacerbation.  Hospital f/u - Pt has been doing well since d/c 8 days ago.  Pt states she has not been smoking since then and is working on sanitizing her house from the smell.  Currently she is residing in her son's house with home health PT coming to evaluate her and work with her.  She is on 2 L [place] currently with ambulation and at rest and she feels like she really needs it during her activity.  She denies sputum production, fever, worsening cough, chills, or sweats.    Past Medical History  Diagnosis Date  . COPD (chronic obstructive pulmonary disease)     History  Smoking status  . Former Smoker -- 40 years  . Types: Cigarettes  . Quit date: 09/13/2009  Smokeless tobacco  . Not on file    Family History  Problem Relation Age of Onset  . Dementia Mother   . Cancer Father   . Heart disease Father   . Thyroid disease Sister   . Thyroid disease Daughter     Current Outpatient Prescriptions on File Prior to Visit  Medication Sig Dispense Refill  . albuterol (PROAIR HFA) 108 (90 BASE) MCG/ACT inhaler Inhale 2 puffs into the lungs every 6 (six) hours as needed for wheezing.      Marland Kitchen alendronate (FOSAMAX) 70 MG tablet Take 1 tablet (70 mg total) by mouth every 7 (seven) days. Take with a full glass of water on an empty stomach.  4 tablet  11  . ALPRAZolam (XANAX) 0.5 MG tablet Take 1 tablet (0.5 mg total) by mouth at bedtime as needed.  30 tablet  0  . budesonide-formoterol (SYMBICORT) 160-4.5 MCG/ACT inhaler Inhale 2 puffs into the lungs 2 (two) times daily.  1 Inhaler  11  . buPROPion (WELLBUTRIN XL) 150 MG 24 hr tablet Take 150 mg by mouth daily.      . Calcium Carbonate (CALCIUM 500 PO) Take 500 Units by mouth 3 (three) times daily.      . Cholecalciferol (VITAMIN D) 2000 UNITS tablet Take 4,000 Units by mouth daily.       Marland Kitchen ipratropium-albuterol (DUONEB) 0.5-2.5 (3) MG/3ML  SOLN Take 3 mLs by nebulization 2 (two) times daily.  90 mL  11  . levofloxacin (LEVAQUIN) 750 MG tablet Take 1 tablet (750 mg total) by mouth daily.  7 tablet  0  . tiotropium (SPIRIVA HANDIHALER) 18 MCG inhalation capsule Place 1 capsule (18 mcg total) into inhaler and inhale daily.  30 capsule  11  . triamcinolone cream (KENALOG) 0.1 % Apply topically 2 (two) times daily.  45 g  1   No current facility-administered medications on file prior to visit.    ROS: Per HPI.  All other systems reviewed and are negative.   Physical Exam Filed Vitals:   04/11/13 1554  BP: 124/59  Pulse: 84  Temp: 98.2 F (36.8 C)    Physical Examination: General appearance - acyanotic, in no respiratory distress Nose -  in place Neck - No accessory muscle use Chest - wheezing noted expiratory, mild B/L lung fields Cardio: RRR, no murmurs appreciated

## 2013-04-11 NOTE — Patient Instructions (Signed)

## 2013-04-11 NOTE — Assessment & Plan Note (Signed)
Pt doing well since hospital d/c.  Has quit smoking and is currently on 2 L [place] w/ saturations > 97%.  Feels like she needs to the O2 currently to move around.  Denies fevers, increased sputum production, change in sputum color.  Is following with home health for PT and this is going well.  Will see back in 2 weeks and wean O2 as tolerated w/ goal O2 of >92%.

## 2013-05-09 ENCOUNTER — Encounter: Payer: Self-pay | Admitting: Family Medicine

## 2013-05-09 ENCOUNTER — Ambulatory Visit (INDEPENDENT_AMBULATORY_CARE_PROVIDER_SITE_OTHER): Payer: Medicare Other | Admitting: Family Medicine

## 2013-05-09 VITALS — BP 137/64 | HR 75 | Temp 99.3°F | Ht 67.0 in | Wt 168.0 lb

## 2013-05-09 DIAGNOSIS — Z87891 Personal history of nicotine dependence: Secondary | ICD-10-CM

## 2013-05-09 DIAGNOSIS — J449 Chronic obstructive pulmonary disease, unspecified: Secondary | ICD-10-CM

## 2013-05-09 NOTE — Patient Instructions (Signed)
Smoking Cessation Quitting smoking is important to your health and has many advantages. However, it is not always easy to quit since nicotine is a very addictive drug. Often times, people try 3 times or more before being able to quit. This document explains the best ways for you to prepare to quit smoking. Quitting takes hard work and a lot of effort, but you can do it. ADVANTAGES OF QUITTING SMOKING  You will live longer, feel better, and live better.  Your body will feel the impact of quitting smoking almost immediately.  Within 20 minutes, blood pressure decreases. Your pulse returns to its normal level.  After 8 hours, carbon monoxide levels in the blood return to normal. Your oxygen level increases.  After 24 hours, the chance of having a heart attack starts to decrease. Your breath, hair, and body stop smelling like smoke.  After 48 hours, damaged nerve endings begin to recover. Your sense of taste and smell improve.  After 72 hours, the body is virtually free of nicotine. Your bronchial tubes relax and breathing becomes easier.  After 2 to 12 weeks, lungs can hold more air. Exercise becomes easier and circulation improves.  The risk of having a heart attack, stroke, cancer, or lung disease is greatly reduced.  After 1 year, the risk of coronary heart disease is cut in half.  After 5 years, the risk of stroke falls to the same as a nonsmoker.  After 10 years, the risk of lung cancer is cut in half and the risk of other cancers decreases significantly.  After 15 years, the risk of coronary heart disease drops, usually to the level of a nonsmoker.  If you are pregnant, quitting smoking will improve your chances of having a healthy baby.  The people you live with, especially any children, will be healthier.  You will have extra money to spend on things other than cigarettes. QUESTIONS TO THINK ABOUT BEFORE ATTEMPTING TO QUIT You may want to talk about your answers with your  caregiver.  Why do you want to quit?  If you tried to quit in the past, what helped and what did not?  What will be the most difficult situations for you after you quit? How will you plan to handle them?  Who can help you through the tough times? Your family? Friends? A caregiver?  What pleasures do you get from smoking? What ways can you still get pleasure if you quit? Here are some questions to ask your caregiver:  How can you help me to be successful at quitting?  What medicine do you think would be best for me and how should I take it?  What should I do if I need more help?  What is smoking withdrawal like? How can I get information on withdrawal? GET READY  Set a quit date.  Change your environment by getting rid of all cigarettes, ashtrays, matches, and lighters in your home, car, or work. Do not let people smoke in your home.  Review your past attempts to quit. Think about what worked and what did not. GET SUPPORT AND ENCOURAGEMENT You have a better chance of being successful if you have help. You can get support in many ways.  Tell your family, friends, and co-workers that you are going to quit and need their support. Ask them not to smoke around you.  Get individual, group, or telephone counseling and support. Programs are available at local hospitals and health centers. Call your local health department for   information about programs in your area.  Spiritual beliefs and practices may help some smokers quit.  Download a "quit meter" on your computer to keep track of quit statistics, such as how long you have gone without smoking, cigarettes not smoked, and money saved.  Get a self-help book about quitting smoking and staying off of tobacco. LEARN NEW SKILLS AND BEHAVIORS  Distract yourself from urges to smoke. Talk to someone, go for a walk, or occupy your time with a task.  Change your normal routine. Take a different route to work. Drink tea instead of coffee.  Eat breakfast in a different place.  Reduce your stress. Take a hot bath, exercise, or read a book.  Plan something enjoyable to do every day. Reward yourself for not smoking.  Explore interactive web-based programs that specialize in helping you quit. GET MEDICINE AND USE IT CORRECTLY Medicines can help you stop smoking and decrease the urge to smoke. Combining medicine with the above behavioral methods and support can greatly increase your chances of successfully quitting smoking.  Nicotine replacement therapy helps deliver nicotine to your body without the negative effects and risks of smoking. Nicotine replacement therapy includes nicotine gum, lozenges, inhalers, nasal sprays, and skin patches. Some may be available over-the-counter and others require a prescription.  Antidepressant medicine helps people abstain from smoking, but how this works is unknown. This medicine is available by prescription.  Nicotinic receptor partial agonist medicine simulates the effect of nicotine in your brain. This medicine is available by prescription. Ask your caregiver for advice about which medicines to use and how to use them based on your health history. Your caregiver will tell you what side effects to look out for if you choose to be on a medicine or therapy. Carefully read the information on the package. Do not use any other product containing nicotine while using a nicotine replacement product.  RELAPSE OR DIFFICULT SITUATIONS Most relapses occur within the first 3 months after quitting. Do not be discouraged if you start smoking again. Remember, most people try several times before finally quitting. You may have symptoms of withdrawal because your body is used to nicotine. You may crave cigarettes, be irritable, feel very hungry, cough often, get headaches, or have difficulty concentrating. The withdrawal symptoms are only temporary. They are strongest when you first quit, but they will go away within  10 14 days. To reduce the chances of relapse, try to:  Avoid drinking alcohol. Drinking lowers your chances of successfully quitting.  Reduce the amount of caffeine you consume. Once you quit smoking, the amount of caffeine in your body increases and can give you symptoms, such as a rapid heartbeat, sweating, and anxiety.  Avoid smokers because they can make you want to smoke.  Do not let weight gain distract you. Many smokers will gain weight when they quit, usually less than 10 pounds. Eat a healthy diet and stay active. You can always lose the weight gained after you quit.  Find ways to improve your mood other than smoking. FOR MORE INFORMATION  www.smokefree.gov  Document Released: 08/24/2001 Document Revised: 02/29/2012 Document Reviewed: 12/09/2011 [employer] Patient Information 2014 [employer].  

## 2013-05-09 NOTE — Assessment & Plan Note (Signed)
Stable off of O2, continue with symbicort and spiriva.  Will need formal PFT's as last ones were >2 yrs ago.

## 2013-05-09 NOTE — Progress Notes (Signed)
Rachel Perez is a 72 y.o. female who presents today for COPD exacerbation f/u from July 19th.  At last visit on 7/30, she was still on 2 L of O2 for functioning.  Over the last 4 weeks, she has slowly weaned off of this and about 1-2 weeks ago has been on RA w/o any progressive SOB, coughing, increased mucous production.  As well, she has not been smoking since her hospitalization.  She is continuing on her spiriva and symbicort w/o described ADR.   Past Medical History  Diagnosis Date  . COPD (chronic obstructive pulmonary disease)     History  Smoking status  . Former Smoker -- 40 years  . Types: Cigarettes  . Quit date: 09/13/2009  Smokeless tobacco  . Not on file    Family History  Problem Relation Age of Onset  . Dementia Mother   . Cancer Father   . Heart disease Father   . Thyroid disease Sister   . Thyroid disease Daughter     Current Outpatient Prescriptions on File Prior to Visit  Medication Sig Dispense Refill  . albuterol (PROAIR HFA) 108 (90 BASE) MCG/ACT inhaler Inhale 2 puffs into the lungs every 6 (six) hours as needed for wheezing.      Marland Kitchen alendronate (FOSAMAX) 70 MG tablet Take 1 tablet (70 mg total) by mouth every 7 (seven) days. Take with a full glass of water on an empty stomach.  4 tablet  11  . ALPRAZolam (XANAX) 0.5 MG tablet Take 1 tablet (0.5 mg total) by mouth at bedtime as needed.  30 tablet  0  . budesonide-formoterol (SYMBICORT) 160-4.5 MCG/ACT inhaler Inhale 2 puffs into the lungs 2 (two) times daily.  1 Inhaler  11  . buPROPion (WELLBUTRIN XL) 150 MG 24 hr tablet Take 150 mg by mouth daily.      . Calcium Carbonate (CALCIUM 500 PO) Take 500 Units by mouth 3 (three) times daily.      . Cholecalciferol (VITAMIN D) 2000 UNITS tablet Take 4,000 Units by mouth daily.       Marland Kitchen ipratropium-albuterol (DUONEB) 0.5-2.5 (3) MG/3ML SOLN Take 3 mLs by nebulization 2 (two) times daily.  90 mL  11  . tiotropium (SPIRIVA HANDIHALER) 18 MCG inhalation capsule Place  1 capsule (18 mcg total) into inhaler and inhale daily.  30 capsule  11  . triamcinolone cream (KENALOG) 0.1 % Apply topically 2 (two) times daily.  45 g  1   No current facility-administered medications on file prior to visit.    ROS: Per HPI.  All other systems reviewed and are negative.   Physical Exam Filed Vitals:   05/09/13 1354  BP: 137/64  Pulse: 75  Temp: 99.3 F (37.4 C)    Physical Examination: General appearance - acyanotic, in no respiratory distress, RR 12-14/min Neck - No accessory muscle use  Chest - no wheezing, rales, rhonchi, normal WOB Cardio: RRR, no murmurs appreciated

## 2013-05-09 NOTE — Assessment & Plan Note (Signed)
Pt has not smoked in over 6 weeks, since being hospitalized.  She does state she has the desire but has not used any cigarettes or tobacco products since d/c from hospital.  Encouraged her to continue and if she needed any help in this process to let us know.

## 2013-06-09 ENCOUNTER — Other Ambulatory Visit: Payer: Self-pay | Admitting: Internal Medicine

## 2013-07-02 ENCOUNTER — Ambulatory Visit (INDEPENDENT_AMBULATORY_CARE_PROVIDER_SITE_OTHER): Payer: Medicare Other | Admitting: Physician Assistant

## 2013-07-02 VITALS — BP 124/60 | HR 87 | Temp 98.0°F | Resp 17 | Wt 176.0 lb

## 2013-07-02 DIAGNOSIS — N390 Urinary tract infection, site not specified: Secondary | ICD-10-CM

## 2013-07-02 DIAGNOSIS — R300 Dysuria: Secondary | ICD-10-CM

## 2013-07-02 LAB — POCT UA - MICROSCOPIC ONLY
Casts, Ur, LPF, POC: NEGATIVE
Crystals, Ur, HPF, POC: NEGATIVE
Yeast, UA: NEGATIVE

## 2013-07-02 LAB — POCT URINALYSIS DIPSTICK
Nitrite, UA: NEGATIVE
Protein, UA: NEGATIVE
Urobilinogen, UA: 0.2
pH, UA: 7

## 2013-07-02 MED ORDER — LEVOFLOXACIN 500 MG PO TABS: 500.0000 mg | ORAL_TABLET | Freq: Every day | ORAL | Status: DC

## 2013-07-02 NOTE — Progress Notes (Signed)
[address] [place] [address]   Phone [phone]  Subjective:    Patient ID: Rachel Perez, female    DOB: [birth year], 72 y.o.   MRN: [id number]  HPI Pt presents to clinic with concerns that she has a UTI.  She is having a funny feeling and that is usually when she has an infection.  She is feeling better from her hospitalization in July from her COPD but does not feel like her breathing has returned to normal. She has an appt with Dr Shelle Iron tomorrow and plans to make a long term goal and plans with him in regards to her breathing.    She did admit to smoking about 2 ppd all winter long but since her hospitalization she has not smoked and plans to never do it again - in fact she had her condo professional cleaned to get out the smoke smell.  She still wants to smoke and uses Xanax when it gets really bad. She is SOB a lot - has pulse ox that she uses at home and during the day without activity it is around 92% but with activity drops to 87%, in the am when she 1st wakes runs about 90%.  Review of Systems  Genitourinary: Positive for dysuria and urgency. Negative for frequency.       Objective:   Physical Exam  Vitals reviewed. Constitutional: She is oriented to person, place, and time. She appears well-developed and well-nourished.  HENT:  Head: Normocephalic and atraumatic.  Right Ear: Hearing, tympanic membrane, external ear and ear canal normal.  Left Ear: Hearing, tympanic membrane and external ear normal.  Nose: Nose normal.  Mouth/Throat: Uvula is midline, oropharynx is clear and moist and mucous membranes are normal.  Eyes: Conjunctivae are normal.  Neck: Neck supple.  Cardiovascular: Normal rate, regular rhythm and normal heart sounds.   No murmur heard. Pulmonary/Chest: Effort normal. She has wheezes (all long fields - inspiratory<expiratory).  Abdominal: Soft. Bowel sounds are normal. There is tenderness (suprapubic TTP). There is no CVA tenderness.    Neurological: She is alert and oriented to person, place, and time.  Skin: Skin is warm and dry.  Psychiatric: She has a normal mood and affect. Her behavior is normal. Judgment and thought content normal.   Results for orders placed in visit on 07/02/13  POCT UA - MICROSCOPIC ONLY      Result Value Range   WBC, Ur, HPF, POC 6-10     RBC, urine, microscopic neg     Bacteria, U Microscopic trace     Mucus, UA neg     Epithelial cells, urine per micros 0-3     Crystals, Ur, HPF, POC neg     Casts, Ur, LPF, POC neg     Yeast, UA neg    POCT URINALYSIS DIPSTICK      Result Value Range   Color, UA yellow     Clarity, UA clear     Glucose, UA neg     Bilirubin, UA neg     Ketones, UA neg     Spec Grav, UA 1.015     Blood, UA neg     pH, UA 7.0     Protein, UA neg     Urobilinogen, UA 0.2     Nitrite, UA neg     Leukocytes, UA large (3+)          Assessment & Plan:  Dysuria - Plan: POCT UA - Microscopic Only, POCT urinalysis dipstick  UTI (urinary tract infection) - Plan: levofloxacin (LEVAQUIN) 500 MG tablet, Urine culture  COPD - pt to see Dr Shelle Iron tomorrow - d/w her possible night-time oximetry or sleep study - I am concerned she is running low at night. Congradulated on no smoking.  Benny Lennert PA-C 07/02/2013 8:54 PM

## 2013-07-03 ENCOUNTER — Encounter: Payer: Self-pay | Admitting: Pulmonary Disease

## 2013-07-03 ENCOUNTER — Encounter (INDEPENDENT_AMBULATORY_CARE_PROVIDER_SITE_OTHER): Payer: Self-pay

## 2013-07-03 ENCOUNTER — Ambulatory Visit (INDEPENDENT_AMBULATORY_CARE_PROVIDER_SITE_OTHER): Payer: Medicare Other | Admitting: Pulmonary Disease

## 2013-07-03 VITALS — BP 122/60 | HR 78 | Temp 97.7°F | Ht 68.0 in | Wt 177.6 lb

## 2013-07-03 DIAGNOSIS — J449 Chronic obstructive pulmonary disease, unspecified: Secondary | ICD-10-CM

## 2013-07-03 DIAGNOSIS — J441 Chronic obstructive pulmonary disease with (acute) exacerbation: Secondary | ICD-10-CM

## 2013-07-03 MED ORDER — PREDNISONE 10 MG PO TABS: ORAL_TABLET | ORAL | Status: DC

## 2013-07-03 NOTE — Patient Instructions (Signed)
Continue on symbicort/spiriva as scheduled Stay off cigarettes Will treat with a course of prednisone to get you thru this episode. Work on some type of exercise program Would like to see you back in 4 weeks, and will check your oxygen levels with exertion.

## 2013-07-03 NOTE — Assessment & Plan Note (Signed)
The patient's history is most consistent with a COPD exacerbation.  The patient has known severe airflow obstruction that is primarily secondary to emphysema, but she may have an asthmatic component to her disease.  I have asked her to stay on her current bronchodilator regimen, but moves toward using her nebulizer treatments only as needed.  We'll treat her with a course of steroids to get her through this episode, but will hold off on antibiotics unless she begins to have a lot more mucus production with purulence.

## 2013-07-03 NOTE — Progress Notes (Signed)
  Subjective:    Patient ID: Rachel Perez, female    DOB: 11-29-1940, 72 y.o.   MRN: [id number]  HPI The patient comes in today for followup of her COPD.  She has not been seen in 2-1/2 years, and comes in today with a 3 to four-week history of increasing shortness of breath and chest tightness with wheezing since the weather changed.  She is having some congestion, but very little mucus.  She does not feel that she has a chest cold at this time.  She is staying on her bronchodilator regimen, and at least has quit smoking.   Review of Systems  Constitutional: Negative for fever and unexpected weight change.  HENT: Negative for congestion, dental problem, ear pain, nosebleeds, postnasal drip, rhinorrhea, sinus pressure, sneezing, sore throat and trouble swallowing.   Eyes: Negative for redness and itching.  Respiratory: Negative for cough, chest tightness, shortness of breath and wheezing.   Cardiovascular: Negative for palpitations and leg swelling.  Gastrointestinal: Negative for nausea and vomiting.  Genitourinary: Negative for dysuria.  Musculoskeletal: Negative for joint swelling.  Skin: Negative for rash.  Neurological: Negative for headaches.  Hematological: Does not bruise/bleed easily.  Psychiatric/Behavioral: Negative for dysphoric mood. The patient is not nervous/anxious.        Objective:   Physical Exam Overweight female in no acute distress Nose without purulence or discharge noted Oropharynx clear Neck without lymphadenopathy or thyromegaly Chest with adequate airflow, no wheezing. Heart exam with regular rate and rhythm Lower extremities with no significant edema, no cyanosis Alert and oriented, moves all 4 extremities.       Assessment & Plan:

## 2013-07-04 ENCOUNTER — Encounter: Payer: Self-pay | Admitting: Physician Assistant

## 2013-07-04 LAB — URINE CULTURE: Colony Count: 25000

## 2013-08-06 ENCOUNTER — Encounter: Payer: Self-pay | Admitting: Pulmonary Disease

## 2013-08-06 ENCOUNTER — Other Ambulatory Visit: Payer: Self-pay | Admitting: Pulmonary Disease

## 2013-08-06 ENCOUNTER — Ambulatory Visit (INDEPENDENT_AMBULATORY_CARE_PROVIDER_SITE_OTHER): Payer: Medicare Other | Admitting: Pulmonary Disease

## 2013-08-06 VITALS — BP 138/78 | HR 75 | Temp 98.2°F | Ht 68.0 in | Wt 188.0 lb

## 2013-08-06 DIAGNOSIS — J449 Chronic obstructive pulmonary disease, unspecified: Secondary | ICD-10-CM

## 2013-08-06 DIAGNOSIS — J439 Emphysema, unspecified: Secondary | ICD-10-CM

## 2013-08-06 NOTE — Progress Notes (Signed)
  Subjective:    Patient ID: Rachel Perez, female    DOB: 04/15/1941, 72 y.o.   MRN: [id number]  HPI Patient comes in today for followup of her known severe COPD. She was having increased symptoms at last visit, but was treated with a course of prednisone. He saw very little change in her breathing, but at the same time feels that she is breathing adequately. She is staying compliant on her bronchodilator regimen, and is not having any purulent mucus or congestion.   Review of Systems  Constitutional: Negative for fever and unexpected weight change.  HENT: Negative for congestion, dental problem, ear pain, nosebleeds, postnasal drip, rhinorrhea, sinus pressure, sneezing, sore throat and trouble swallowing.   Eyes: Negative for redness and itching.  Respiratory: Negative for cough, chest tightness, shortness of breath and wheezing.   Cardiovascular: Negative for palpitations and leg swelling.  Gastrointestinal: Negative for nausea and vomiting.  Genitourinary: Negative for dysuria.  Musculoskeletal: Negative for joint swelling.  Skin: Negative for rash.  Neurological: Negative for headaches.  Hematological: Does not bruise/bleed easily.  Psychiatric/Behavioral: Negative for dysphoric mood. The patient is not nervous/anxious.        Objective:   Physical Exam Overweight female in no acute distress Nose  without purulence or discharge noted length Neck without lymphadenopathy or thyromegaly Chest with decreased breath sounds, no wheezes or rhonchi cardiac exam with regular rate and rhythm  Lower extremities with minimal edema, no cyanosis Alert and oriented, moves all 4 extremities       Assessment & Plan:

## 2013-08-06 NOTE — Assessment & Plan Note (Signed)
The patient appears to be at a stable baseline currently. Her exertional tolerance is not what she would like, and I have explained that she can help this with a conditioning program and weight loss. She is willing to attend pulmonary rehabilitation if she can afford it. We will also check her exertional saturations today for completeness.

## 2013-08-06 NOTE — Patient Instructions (Signed)
No change in medications for breathing.  Will refer you to the pulmonary rehab maintenance program at [facility].   Work on weight loss followup with me again in 6mos.

## 2013-08-13 ENCOUNTER — Other Ambulatory Visit: Payer: Self-pay | Admitting: Radiology

## 2013-08-13 DIAGNOSIS — F419 Anxiety disorder, unspecified: Secondary | ICD-10-CM

## 2013-08-13 NOTE — Telephone Encounter (Signed)
Please advise on Alprazolam Rx patient did not fill in April, has gone now to get this, but is expired.

## 2013-08-16 MED ORDER — ALPRAZOLAM 0.5 MG PO TABS: 0.5000 mg | ORAL_TABLET | Freq: Every evening | ORAL | Status: DC | PRN

## 2013-08-16 NOTE — Telephone Encounter (Signed)
Rx ready.

## 2013-08-17 NOTE — Telephone Encounter (Signed)
Faxed RX 

## 2013-09-26 ENCOUNTER — Encounter: Payer: Self-pay | Admitting: Cardiovascular Disease

## 2013-11-28 ENCOUNTER — Ambulatory Visit (INDEPENDENT_AMBULATORY_CARE_PROVIDER_SITE_OTHER): Payer: Medicare Other | Admitting: Family Medicine

## 2013-11-28 ENCOUNTER — Ambulatory Visit: Payer: Medicare Other

## 2013-11-28 ENCOUNTER — Encounter: Payer: Self-pay | Admitting: Physician Assistant

## 2013-11-28 VITALS — BP 145/68 | HR 85 | Temp 98.4°F | Resp 18 | Ht 66.75 in | Wt 185.8 lb

## 2013-11-28 DIAGNOSIS — E079 Disorder of thyroid, unspecified: Secondary | ICD-10-CM

## 2013-11-28 DIAGNOSIS — M810 Age-related osteoporosis without current pathological fracture: Secondary | ICD-10-CM

## 2013-11-28 DIAGNOSIS — F411 Generalized anxiety disorder: Secondary | ICD-10-CM

## 2013-11-28 DIAGNOSIS — J438 Other emphysema: Secondary | ICD-10-CM

## 2013-11-28 DIAGNOSIS — M25519 Pain in unspecified shoulder: Secondary | ICD-10-CM

## 2013-11-28 DIAGNOSIS — Z1211 Encounter for screening for malignant neoplasm of colon: Secondary | ICD-10-CM

## 2013-11-28 DIAGNOSIS — J439 Emphysema, unspecified: Secondary | ICD-10-CM

## 2013-11-28 DIAGNOSIS — Z0000 Encounter for general adult medical examination without abnormal findings: Secondary | ICD-10-CM

## 2013-11-28 DIAGNOSIS — Z13220 Encounter for screening for lipoid disorders: Secondary | ICD-10-CM

## 2013-11-28 DIAGNOSIS — F329 Major depressive disorder, single episode, unspecified: Secondary | ICD-10-CM

## 2013-11-28 DIAGNOSIS — F3289 Other specified depressive episodes: Secondary | ICD-10-CM

## 2013-11-28 DIAGNOSIS — M19011 Primary osteoarthritis, right shoulder: Secondary | ICD-10-CM

## 2013-11-28 DIAGNOSIS — K921 Melena: Secondary | ICD-10-CM

## 2013-11-28 LAB — CBC WITH DIFFERENTIAL/PLATELET
Basophils Absolute: 0.1 10*3/uL (ref 0.0–0.1)
Basophils Relative: 1 % (ref 0–1)
Eosinophils Absolute: 0.3 10*3/uL (ref 0.0–0.7)
Eosinophils Relative: 4 % (ref 0–5)
HEMATOCRIT: 40 % (ref 36.0–46.0)
HEMOGLOBIN: 13.9 g/dL (ref 12.0–15.0)
Lymphocytes Relative: 22 % (ref 12–46)
Lymphs Abs: 1.8 10*3/uL (ref 0.7–4.0)
MCH: 30 pg (ref 26.0–34.0)
MCHC: 34.8 g/dL (ref 30.0–36.0)
MCV: 86.2 fL (ref 78.0–100.0)
MONOS PCT: 11 % (ref 3–12)
Monocytes Absolute: 0.9 10*3/uL (ref 0.1–1.0)
NEUTROS ABS: 5 10*3/uL (ref 1.7–7.7)
Neutrophils Relative %: 62 % (ref 43–77)
Platelets: 279 10*3/uL (ref 150–400)
RBC: 4.64 MIL/uL (ref 3.87–5.11)
RDW: 13.2 % (ref 11.5–15.5)
WBC: 8 10*3/uL (ref 4.0–10.5)

## 2013-11-28 LAB — LIPID PANEL
CHOL/HDL RATIO: 3.3 ratio
Cholesterol: 182 mg/dL (ref 0–200)
HDL: 55 mg/dL (ref >39)
LDL Cholesterol: 98 mg/dL (ref 0–99)
TRIGLYCERIDES: 143 mg/dL (ref <150)
VLDL: 29 mg/dL (ref 0–40)

## 2013-11-28 LAB — POCT URINALYSIS DIPSTICK
GLUCOSE UA: NEGATIVE
Ketones, UA: NEGATIVE
Nitrite, UA: NEGATIVE
PROTEIN UA: NEGATIVE
SPEC GRAV UA: 1.025
Urobilinogen, UA: 0.2
pH, UA: 5.5

## 2013-11-28 LAB — IFOBT (OCCULT BLOOD): IMMUNOLOGICAL FECAL OCCULT BLOOD TEST: POSITIVE

## 2013-11-28 MED ORDER — ALPRAZOLAM 0.5 MG PO TABS: 0.5000 mg | ORAL_TABLET | Freq: Every evening | ORAL | Status: DC | PRN

## 2013-11-28 MED ORDER — BUPROPION HCL ER (XL) 150 MG PO TB24: 150.0000 mg | ORAL_TABLET | Freq: Every day | ORAL | Status: DC

## 2013-11-28 MED ORDER — BUDESONIDE-FORMOTEROL FUMARATE 160-4.5 MCG/ACT IN AERO: 2.0000 | INHALATION_SPRAY | Freq: Two times a day (BID) | RESPIRATORY_TRACT | Status: DC

## 2013-11-28 MED ORDER — ALBUTEROL SULFATE HFA 108 (90 BASE) MCG/ACT IN AERS: 2.0000 | INHALATION_SPRAY | Freq: Four times a day (QID) | RESPIRATORY_TRACT | Status: DC | PRN

## 2013-11-28 MED ORDER — ALENDRONATE SODIUM 70 MG PO TABS: 70.0000 mg | ORAL_TABLET | ORAL | Status: DC

## 2013-11-28 MED ORDER — TIOTROPIUM BROMIDE MONOHYDRATE 18 MCG IN CAPS: 18.0000 ug | ORAL_CAPSULE | Freq: Every day | RESPIRATORY_TRACT | Status: DC

## 2013-11-28 MED ORDER — CELECOXIB 100 MG PO CAPS: 100.0000 mg | ORAL_CAPSULE | Freq: Two times a day (BID) | ORAL | Status: DC

## 2013-11-28 MED ORDER — IPRATROPIUM-ALBUTEROL 0.5-2.5 (3) MG/3ML IN SOLN: 3.0000 mL | Freq: Two times a day (BID) | RESPIRATORY_TRACT | Status: DC

## 2013-11-28 NOTE — Patient Instructions (Signed)
I will contact you with your lab results as soon as they are available.   If you have not heard from me in 2 weeks, please contact me.  The fastest way to get your results is to register for My Chart (see the instructions on the last page of this printout).   

## 2013-11-28 NOTE — Progress Notes (Signed)
Subjective:    Patient ID: Rachel Perez, female    DOB: 07-Oct-1940, 73 y.o.   MRN: [id number]  HPI Pt presents to clinic for her medicare CPE and right shoulder pain.  Patient Active Problem List   Diagnosis Date Noted  . COPD exacerbation - still seeing Dr Shelle Ironlance - getting ready to start pulmonary rehab -  07/03/2013  . History of tobacco use - quit 7/14 - no cravings anymore 05/09/2013  .    Marland Kitchen. THYROID DISORDER - will check labs which have been normal over the last several labs 09/30/2010  . ANXIETY - she rarely uses her Xanax though she is almost out currently 09/30/2010  . DEPRESSION - she stopped the wellbutrin but she has noticed that she is more irritable lately 09/30/2010    09/30/2010  . COPD (chronic obstructive pulmonary disease) with emphysema 09/30/2010  . OSTEOPOROSIS - continue Vit D, it is time for her bone density testing - she takes her Fosamax weekly and tolerating it well. 09/30/2010   She has been having trouble with her shoulder since her hospitalization.  It recently got worse and is now starting to bother her while sleeping.  She cannot fasten her bra strap due to the pain in her shoulder.  She is starting to have pain in her left shoulder.  Mammogram scheduled for next week. Eye exam is next month. Dentist in the fall. Does not want colonoscopy but will do stool cards yearly. Review of Systems     Objective:   Physical Exam  Vitals reviewed. Constitutional: She is oriented to person, place, and time. She appears well-developed and well-nourished.  HENT:  Head: Normocephalic and atraumatic.  Right Ear: Hearing, tympanic membrane, external ear and ear canal normal.  Left Ear: Hearing, tympanic membrane, external ear and ear canal normal.  Nose: Nose normal.  Mouth/Throat: Uvula is midline, oropharynx is clear and moist and mucous membranes are normal.  Eyes: Conjunctivae are normal.  Neck: Normal range of motion. Neck supple. No thyromegaly present.    Cardiovascular: Normal rate, regular rhythm and normal heart sounds.   No murmur heard. Pulmonary/Chest: Effort normal. She has wheezes (all lung fields, inspiraotry and expiratory).  Abdominal: Soft. Bowel sounds are normal.  Musculoskeletal:       Right shoulder: She exhibits decreased range of motion (internal rotation) and bony tenderness (tender over her AC joint). She exhibits no spasm.  Lymphadenopathy:    She has no cervical adenopathy.  Neurological: She is alert and oriented to person, place, and time.  Skin: Skin is warm and dry.  Psychiatric: She has a normal mood and affect. Her behavior is normal. Judgment and thought content normal.   [facility] reading (PRIMARY) by  Dr. Katrinka BlazingSmith.  AC joint degenerative change.     Assessment & Plan:  Annual physical exam - Plan: CBC with Differential, POCT urinalysis dipstick  ANXIETY - Plan: buPROPion (WELLBUTRIN XL) 150 MG 24 hr tablet, ALPRAZolam (XANAX) 0.5 MG tablet  COPD (chronic obstructive pulmonary disease) with emphysema - Plan: budesonide-formoterol (SYMBICORT) 160-4.5 MCG/ACT inhaler, tiotropium (SPIRIVA) 18 MCG inhalation capsule, ipratropium-albuterol (DUONEB) 0.5-2.5 (3) MG/3ML SOLN, albuterol (PROAIR HFA) 108 (90 BASE) MCG/ACT inhaler  DEPRESSION - we will restart her medication because she is still having depressed mood and anxiety -- Plan: buPROPion (WELLBUTRIN XL) 150 MG 24 hr tablet  THYROID DISORDER - check labs - Plan: TSH  OSTEOPOROSIS - continue Vit D, Fosamax and bone density is ordered -- Plan: Vit D  25 hydroxy (  rtn osteoporosis monitoring), alendronate (FOSAMAX) 70 MG tablet, DG Bone Density  Screening cholesterol level - check labs - Plan: Lipid panel  Pain in joint, shoulder region - Plan: DG Shoulder Right  Screening for colon cancer - Plan: IFOBT POC (occult bld, rslt in office) - the one she brought to the clinic was positive today so she will repeat in a couple of weeks to have a 2nd test -   Hematochezia -  Plan: IFOBT POC (occult bld, rslt in office)  Arthritis of right acromioclavicular joint - We will do a trial of NSAIDs and if she gets improvement great if she does not we will consider a AC joint injection or refer her to ortho - she can do ice on the shoulder on days that she is having bad pain. Plan: celecoxib (CELEBREX) 100 MG capsule  Benny Lennert PA-C  [facility] 11/28/2013 10:47 PM

## 2013-11-29 LAB — VITAMIN D 25 HYDROXY (VIT D DEFICIENCY, FRACTURES): Vit D, 25-Hydroxy: 45 ng/mL (ref 30–89)

## 2013-11-29 LAB — TSH: TSH: 2.64 u[IU]/mL (ref 0.350–4.500)

## 2013-11-29 NOTE — Progress Notes (Signed)
Xray read and patient discussed with Sarah Weber, PA-C. Agree with assessment and plan of care per her note.   

## 2013-12-12 ENCOUNTER — Other Ambulatory Visit: Payer: Self-pay | Admitting: Physician Assistant

## 2014-01-02 ENCOUNTER — Telehealth: Payer: Self-pay

## 2014-01-02 DIAGNOSIS — M19019 Primary osteoarthritis, unspecified shoulder: Secondary | ICD-10-CM

## 2014-01-02 DIAGNOSIS — M25519 Pain in unspecified shoulder: Secondary | ICD-10-CM

## 2014-01-02 DIAGNOSIS — J441 Chronic obstructive pulmonary disease with (acute) exacerbation: Secondary | ICD-10-CM

## 2014-01-02 NOTE — Telephone Encounter (Signed)
PT [facility] AND NOW HER INSURANCE WON'T COVER IT, HER INSURANCE WILL COVER THE PRO-AIR PLEASE CALL [phone]  PT ALSO WOULD LIKE TO BE REFERRED TO AN ORTH   [facility] ON [address]

## 2014-01-03 MED ORDER — PROAIR HFA 108 (90 BASE) MCG/ACT IN AERS: 1.0000 | INHALATION_SPRAY | Freq: Four times a day (QID) | RESPIRATORY_TRACT | Status: DC | PRN

## 2014-01-03 NOTE — Telephone Encounter (Signed)
I sent in proair so please make sure she lets us know if she has problems getting it filled.

## 2014-01-03 NOTE — Telephone Encounter (Signed)
Called pt to notify Rx and referral were done. LMOM.

## 2014-02-05 ENCOUNTER — Ambulatory Visit: Payer: Medicare Other | Admitting: Pulmonary Disease

## 2014-03-18 ENCOUNTER — Ambulatory Visit: Payer: Medicare Other | Admitting: Pulmonary Disease

## 2014-05-29 ENCOUNTER — Ambulatory Visit: Payer: Medicare Other | Admitting: Physician Assistant

## 2014-05-30 ENCOUNTER — Ambulatory Visit: Payer: Medicare Other | Admitting: Physician Assistant

## 2014-06-11 ENCOUNTER — Other Ambulatory Visit: Payer: Self-pay | Admitting: Physician Assistant

## 2014-06-27 ENCOUNTER — Ambulatory Visit: Payer: Medicare Other | Admitting: Physician Assistant

## 2014-06-28 ENCOUNTER — Other Ambulatory Visit: Payer: Self-pay | Admitting: Physician Assistant

## 2014-07-03 ENCOUNTER — Other Ambulatory Visit: Payer: Self-pay | Admitting: Physician Assistant

## 2014-07-04 NOTE — Telephone Encounter (Signed)
Maralyn SagoSarah, intended to give RFs for a year when written in March, but the quantity was only sufficient for 15 days per fill. I sent in the remaining RFs w/corrected quantity.

## 2014-07-07 ENCOUNTER — Other Ambulatory Visit: Payer: Self-pay | Admitting: Physician Assistant

## 2014-08-10 ENCOUNTER — Telehealth: Payer: Self-pay | Admitting: Physician Assistant

## 2014-08-12 NOTE — Telephone Encounter (Signed)
Called pt to ask about f/up plans and she agreed she does need to get in to see Maralyn SagoSarah, but she is afraid to come into the walk in clinic w/COPD in height of flu season, and Maralyn SagoSarah is not seeing pts now at appt center. I checked w/Sched and was advised it will prob be March bf Maralyn SagoSarah takes appts again. Maralyn SagoSarah, will you OK RFs until that time, or do you need to have pt sch appt w/another provider? Pt also stated that her cost for Spiriva all the sudden dropped from over $100 to $17 last RF d/t where she is currently for the year under Medicare coverage and she would REALLY love to get two 30 day RFs at least that she can fill bf the end of the year to help her out financially.

## 2014-08-13 NOTE — Telephone Encounter (Signed)
I am happy to give her medication to get her through to when she is able to see me.  It will be May before I am taking appts due to child care issues (you can tell her that).  I will send Spriva which other medications does she need?

## 2014-08-13 NOTE — Telephone Encounter (Signed)
Pt thanked us and said she does not need anything else at this time. She said she has another issue going on and will probably try to come in one day soon to see Maralyn SagoSarah anyway.

## 2014-08-14 NOTE — Telephone Encounter (Signed)
Pt CB just to thank me and Maralyn SagoSarah for going above and beyond to help her get her Spiriva. She stated that she was able to get 3 inhalers for just $42 (before the end of the year) and she really appreciates us understanding that she didn't want to come into the walk in and chance getting sick. Clare GandySarah, FYI, I thought it was very nice of her to call me back to thank me/us again.

## 2014-10-04 DIAGNOSIS — J438 Other emphysema: Secondary | ICD-10-CM | POA: Diagnosis not present

## 2014-10-15 ENCOUNTER — Encounter: Payer: Self-pay | Admitting: Physician Assistant

## 2014-10-15 ENCOUNTER — Ambulatory Visit (INDEPENDENT_AMBULATORY_CARE_PROVIDER_SITE_OTHER): Payer: Medicare Other | Admitting: Internal Medicine

## 2014-10-15 VITALS — BP 128/64 | HR 85 | Temp 98.0°F | Resp 18 | Ht 66.5 in | Wt 180.0 lb

## 2014-10-15 DIAGNOSIS — J439 Emphysema, unspecified: Secondary | ICD-10-CM | POA: Diagnosis not present

## 2014-10-15 DIAGNOSIS — R350 Frequency of micturition: Secondary | ICD-10-CM

## 2014-10-15 DIAGNOSIS — N3000 Acute cystitis without hematuria: Secondary | ICD-10-CM

## 2014-10-15 DIAGNOSIS — R300 Dysuria: Secondary | ICD-10-CM

## 2014-10-15 DIAGNOSIS — Z23 Encounter for immunization: Secondary | ICD-10-CM | POA: Diagnosis not present

## 2014-10-15 DIAGNOSIS — F411 Generalized anxiety disorder: Secondary | ICD-10-CM

## 2014-10-15 LAB — POCT UA - MICROSCOPIC ONLY
CASTS, UR, LPF, POC: NEGATIVE
CRYSTALS, UR, HPF, POC: NEGATIVE
Mucus, UA: POSITIVE
RBC, urine, microscopic: NEGATIVE
Yeast, UA: NEGATIVE

## 2014-10-15 LAB — POCT URINALYSIS DIPSTICK
Bilirubin, UA: NEGATIVE
GLUCOSE UA: NEGATIVE
Ketones, UA: NEGATIVE
NITRITE UA: POSITIVE
PROTEIN UA: NEGATIVE
SPEC GRAV UA: 1.01
Urobilinogen, UA: 0.2
pH, UA: 5.5

## 2014-10-15 MED ORDER — BUDESONIDE-FORMOTEROL FUMARATE 160-4.5 MCG/ACT IN AERO: 2.0000 | INHALATION_SPRAY | Freq: Two times a day (BID) | RESPIRATORY_TRACT | Status: DC

## 2014-10-15 MED ORDER — IPRATROPIUM-ALBUTEROL 0.5-2.5 (3) MG/3ML IN SOLN: RESPIRATORY_TRACT | Status: DC

## 2014-10-15 MED ORDER — SULFAMETHOXAZOLE-TRIMETHOPRIM 800-160 MG PO TABS
1.0000 | ORAL_TABLET | Freq: Two times a day (BID) | ORAL | Status: DC
Start: 2014-10-15 — End: 2015-01-29

## 2014-10-15 MED ORDER — ALPRAZOLAM 0.5 MG PO TABS: 0.5000 mg | ORAL_TABLET | Freq: Every evening | ORAL | Status: DC | PRN

## 2014-10-15 NOTE — Progress Notes (Signed)
Subjective:    Patient ID: Rachel Perez, female    DOB: 12-21-1940, 74 y.o.   MRN: [id number]  HPI    Review of Systems  Constitutional: Negative for fever and chills.  Gastrointestinal: Negative for nausea and abdominal pain.  Genitourinary: Positive for dysuria, urgency and frequency. Negative for hematuria.  Musculoskeletal: Negative for back pain.   Patient Active Problem List   Diagnosis Date Noted  . COPD exacerbation 07/03/2013  . History of tobacco use 05/09/2013  [facility]  . THYROID DISORDER 09/30/2010  . ANXIETY 09/30/2010  . DEPRESSION 09/30/2010  . HEMORRHOIDS 09/30/2010  . COPD (chronic obstructive pulmonary disease) with emphysema 09/30/2010  . OSTEOPOROSIS 09/30/2010   Prior to Admission medications   Medication Sig Start Date End Date Taking? Authorizing Provider  albuterol (PROAIR HFA) 108 (90 BASE) MCG/ACT inhaler Inhale 2 puffs into the lungs every 6 (six) hours as needed for wheezing. Patient not taking: Reported on 10/15/2014 11/28/13   Morrell RiddleSarah L Eriel Dunckel, PA-C  alendronate (FOSAMAX) 70 MG tablet Take 1 tablet (70 mg total) by mouth every 7 (seven) days. Take with a full glass of water on an empty stomach. 11/28/13   Morrell RiddleSarah L Alec Mcphee, PA-C  ALPRAZolam Prudy Feeler(XANAX) 0.5 MG tablet Take 1 tablet (0.5 mg total) by mouth at bedtime as needed. 11/28/13   Morrell RiddleSarah L Anissia Wessells, PA-C  budesonide-formoterol (SYMBICORT) 160-4.5 MCG/ACT inhaler Inhale 2 puffs into the lungs 2 (two) times daily. 10/15/14   Morrell RiddleSarah L Arieona Swaggerty, PA-C  buPROPion (WELLBUTRIN XL) 150 MG 24 hr tablet Take 1 tablet (150 mg total) by mouth daily. 11/28/13   Morrell RiddleSarah L Saryah Loper, PA-C  Calcium Carbonate (CALCIUM 500 PO) Take 500 Units by mouth 3 (three) times daily.    Historical Provider, MD  Cholecalciferol (VITAMIN D) 2000 UNITS tablet Take 4,500 Units by mouth daily.     Historical Provider, MD  ipratropium-albuterol (DUONEB) 0.5-2.5 (3) MG/3ML SOLN Inhale 1 vial via nebulizer twice a day. 10/15/14   Morrell RiddleSarah L Braxtyn Dorff, PA-C    PROAIR HFA 108 (90 BASE) MCG/ACT inhaler Inhale 1-2 puffs into the lungs every 6 (six) hours as needed for wheezing or shortness of breath. 01/03/14   Morrell RiddleSarah L Garmon Dehn, PA-C  SPIRIVA HANDIHALER 18 MCG inhalation capsule INHALE CONTENTS OF 1 CAPSULE VIA HANDIHALER AT THE SAME TIME EVERY DAY 08/13/14   Morrell RiddleSarah L Geisha Abernathy, PA-C   Allergies  Allergen Reactions  . Celebrex [Celecoxib]     Tendonitis  . Levaquin [Levofloxacin]     Tendonitis   . Penicillins Other (See Comments)    psorasis    Medications, allergies, past medical history, surgical history, family history, social history and problem list reviewed and updated.      Objective:   Physical Exam   Results for orders placed or performed in visit on 10/15/14  POCT UA - Microscopic Only  Result Value Ref Range   WBC, Ur, HPF, POC 4-10    RBC, urine, microscopic Neg    Bacteria, U Microscopic 2+    Mucus, UA Pos    Epithelial cells, urine per micros 0-4    Crystals, Ur, HPF, POC neg    Casts, Ur, LPF, POC neg    Yeast, UA neg   POCT urinalysis dipstick  Result Value Ref Range   Color, UA Dk amber    Clarity, UA cloudy    Glucose, UA neg    Bilirubin, UA neg    Ketones, UA neg    Spec Grav, UA  1.010    Blood, UA Trace-intact    pH, UA 5.5    Protein, UA neg    Urobilinogen, UA 0.2    Nitrite, UA pos    Leukocytes, UA small (1+)         Assessment & Plan:

## 2014-10-17 NOTE — Progress Notes (Signed)
Subjective:    Patient ID: Rachel Perez, female    DOB: 1941/04/29, 74 y.o.   MRN: [id number]  HPI Pt presents to clinic with dysuria, urinary frequency and urgency for the last 3 days that is getting worse.  She took an OTC Azo and that helped.   She would also like her Flu vaccine and the Pneumovax.  She needs refills on some of her medications.  She has recently re-mortgaged her house so she can start   Review of Systems  Gastrointestinal: Negative for abdominal pain.  Genitourinary: Positive for dysuria, urgency and frequency. Negative for hematuria.  Musculoskeletal: Negative for back pain.   Patient Active Problem List   Diagnosis Date Noted  . COPD exacerbation 07/03/2013  . History of tobacco use 05/09/2013  . [facility] 10/01/2010  . THYROID DISORDER 09/30/2010  . ANXIETY 09/30/2010  . DEPRESSION 09/30/2010  . HEMORRHOIDS 09/30/2010  . COPD (chronic obstructive pulmonary disease) with emphysema 09/30/2010  . OSTEOPOROSIS 09/30/2010   Prior to Admission medications   Medication Sig Start Date End Date Taking? Authorizing Provider  albuterol (PROAIR HFA) 108 (90 BASE) MCG/ACT inhaler Inhale 2 puffs into the lungs every 6 (six) hours as needed for wheezing. Patient not taking: Reported on 10/15/2014 11/28/13   Morrell Riddle, PA-C  alendronate (FOSAMAX) 70 MG tablet Take 1 tablet (70 mg total) by mouth every 7 (seven) days. Take with a full glass of water on an empty stomach. 11/28/13   Morrell Riddle, PA-C  ALPRAZolam Prudy Feeler) 0.5 MG tablet Take 1 tablet (0.5 mg total) by mouth at bedtime as needed. 10/15/14   Morrell Riddle, PA-C  budesonide-formoterol (SYMBICORT) 160-4.5 MCG/ACT inhaler Inhale 2 puffs into the lungs 2 (two) times daily. 10/15/14   Morrell Riddle, PA-C  buPROPion (WELLBUTRIN XL) 150 MG 24 hr tablet Take 1 tablet (150 mg total) by mouth daily. 11/28/13   Morrell Riddle, PA-C  Calcium Carbonate (CALCIUM 500 PO) Take 500 Units by mouth 3 (three) times daily.     Historical Provider, MD  Cholecalciferol (VITAMIN D) 2000 UNITS tablet Take 4,500 Units by mouth daily.     Historical Provider, MD  ipratropium-albuterol (DUONEB) 0.5-2.5 (3) MG/3ML SOLN Inhale 1 vial via nebulizer twice a day. 10/15/14   Morrell Riddle, PA-C  PROAIR HFA 108 (90 BASE) MCG/ACT inhaler Inhale 1-2 puffs into the lungs every 6 (six) hours as needed for wheezing or shortness of breath. 01/03/14   Morrell Riddle, PA-C  SPIRIVA HANDIHALER 18 MCG inhalation capsule INHALE CONTENTS OF 1 CAPSULE VIA HANDIHALER AT THE SAME TIME EVERY DAY 08/13/14   Morrell Riddle, PA-C       Morrell Riddle, PA-C   Allergies  Allergen Reactions  . Celebrex [Celecoxib]     Tendonitis  . Levaquin [Levofloxacin]     Tendonitis   . Penicillins Other (See Comments)    psorasis    Medications, allergies, past medical history, surgical history, family history, social history and problem list reviewed and updated.      Objective:   Physical Exam  Constitutional: She is oriented to person, place, and time. She appears well-developed and well-nourished.  BP 128/64 mmHg  Pulse 85  Temp(Src) 98 F (36.7 C) (Oral)  Resp 18  Ht 5' 6.5" (1.689 m)  Wt 180 lb (81.647 kg)  BMI 28.62 kg/m2  SpO2 92%   HENT:  Head: Normocephalic and atraumatic.  Right Ear: External ear normal.  Left Ear:  External ear normal.  Eyes: Conjunctivae are normal.  Cardiovascular: Normal rate, regular rhythm and normal heart sounds.   No murmur heard. Pulmonary/Chest: Effort normal. She has wheezes (all lungs fields).  Abdominal: Soft. Bowel sounds are normal. There is no tenderness. There is no CVA tenderness.  Neurological: She is alert and oriented to person, place, and time.  Skin: Skin is warm and dry.  Psychiatric: She has a normal mood and affect. Her behavior is normal. Judgment and thought content normal.       Assessment & Plan:  Burning with urination - Plan: POCT UA - Microscopic Only, POCT urinalysis dipstick, Urine  culture  Frequency of urination - Plan: POCT UA - Microscopic Only, POCT urinalysis dipstick  Flu vaccine need - Plan: Flu Vaccine QUAD 36+ mos IM  Need for pneumococcal vaccine - Plan: Pneumococcal conjugate vaccine 13-valent,   Pulmonary emphysema, unspecified emphysema type - Plan: ipratropium-albuterol (DUONEB) 0.5-2.5 (3) MG/3ML SOLN, budesonide-formoterol (SYMBICORT) 160-4.5 MCG/ACT inhaler - encouraged patient about the importance of pulmonary rehab.  Acute cystitis without hematuria - Plan: sulfamethoxazole-trimethoprim (BACTRIM DS,SEPTRA DS) 800-160 MG per tablet  Anxiety state - uses <30 a year but needs then - she has 3 left- Plan: ALPRAZolam (XANAX) 0.5 MG tablet  Dr Merla Richesoolittle also took a history and physical on the patient.   Benny LennertSarah Weber PA-C  [facility] 10/17/2014 10:18 AM  I have participated in the care of this patient with the Advanced Practice Provider and agree with Diagnosis and Plan as documented. Robert P. Merla Richesoolittle, M.D.

## 2014-10-18 LAB — URINE CULTURE: Colony Count: >=100000

## 2014-10-21 NOTE — Progress Notes (Signed)
Scheduled a CPE with Huey RomansSara Weber on 01/29/15 at 115

## 2014-11-04 DIAGNOSIS — J438 Other emphysema: Secondary | ICD-10-CM | POA: Diagnosis not present

## 2014-12-03 DIAGNOSIS — J438 Other emphysema: Secondary | ICD-10-CM | POA: Diagnosis not present

## 2015-01-03 DIAGNOSIS — J438 Other emphysema: Secondary | ICD-10-CM | POA: Diagnosis not present

## 2015-01-29 ENCOUNTER — Encounter: Payer: Self-pay | Admitting: Physician Assistant

## 2015-01-29 ENCOUNTER — Ambulatory Visit (INDEPENDENT_AMBULATORY_CARE_PROVIDER_SITE_OTHER): Payer: Medicare Other | Admitting: Physician Assistant

## 2015-01-29 VITALS — BP 120/60 | HR 76 | Temp 97.9°F | Resp 16 | Ht 67.25 in | Wt 178.4 lb

## 2015-01-29 DIAGNOSIS — J441 Chronic obstructive pulmonary disease with (acute) exacerbation: Secondary | ICD-10-CM

## 2015-01-29 DIAGNOSIS — Z130 Encounter for screening for diseases of the blood and blood-forming organs and certain disorders involving the immune mechanism: Secondary | ICD-10-CM

## 2015-01-29 DIAGNOSIS — F411 Generalized anxiety disorder: Secondary | ICD-10-CM | POA: Diagnosis not present

## 2015-01-29 DIAGNOSIS — M810 Age-related osteoporosis without current pathological fracture: Secondary | ICD-10-CM

## 2015-01-29 DIAGNOSIS — F329 Major depressive disorder, single episode, unspecified: Secondary | ICD-10-CM

## 2015-01-29 DIAGNOSIS — Z1329 Encounter for screening for other suspected endocrine disorder: Secondary | ICD-10-CM | POA: Diagnosis not present

## 2015-01-29 DIAGNOSIS — F418 Other specified anxiety disorders: Secondary | ICD-10-CM | POA: Diagnosis not present

## 2015-01-29 DIAGNOSIS — J439 Emphysema, unspecified: Secondary | ICD-10-CM | POA: Diagnosis not present

## 2015-01-29 DIAGNOSIS — F32A Depression, unspecified: Secondary | ICD-10-CM

## 2015-01-29 DIAGNOSIS — Z8639 Personal history of other endocrine, nutritional and metabolic disease: Secondary | ICD-10-CM

## 2015-01-29 DIAGNOSIS — Z13220 Encounter for screening for lipoid disorders: Secondary | ICD-10-CM | POA: Diagnosis not present

## 2015-01-29 DIAGNOSIS — Z1211 Encounter for screening for malignant neoplasm of colon: Secondary | ICD-10-CM | POA: Diagnosis not present

## 2015-01-29 DIAGNOSIS — Z0000 Encounter for general adult medical examination without abnormal findings: Secondary | ICD-10-CM

## 2015-01-29 DIAGNOSIS — Z13228 Encounter for screening for other metabolic disorders: Secondary | ICD-10-CM

## 2015-01-29 LAB — CBC WITH DIFFERENTIAL/PLATELET
Basophils Absolute: 0.1 10*3/uL (ref 0.0–0.1)
Basophils Relative: 1 % (ref 0–1)
EOS PCT: 2 % (ref 0–5)
Eosinophils Absolute: 0.1 10*3/uL (ref 0.0–0.7)
HEMATOCRIT: 41.3 % (ref 36.0–46.0)
Hemoglobin: 14.3 g/dL (ref 12.0–15.0)
Lymphocytes Relative: 21 % (ref 12–46)
Lymphs Abs: 1.3 10*3/uL (ref 0.7–4.0)
MCH: 30.8 pg (ref 26.0–34.0)
MCHC: 34.6 g/dL (ref 30.0–36.0)
MCV: 88.8 fL (ref 78.0–100.0)
MPV: 11.4 fL (ref 8.6–12.4)
Monocytes Absolute: 0.4 10*3/uL (ref 0.1–1.0)
Monocytes Relative: 7 % (ref 3–12)
NEUTROS PCT: 69 % (ref 43–77)
Neutro Abs: 4.4 10*3/uL (ref 1.7–7.7)
Platelets: 261 10*3/uL (ref 150–400)
RBC: 4.65 MIL/uL (ref 3.87–5.11)
RDW: 13.5 % (ref 11.5–15.5)
WBC: 6.4 10*3/uL (ref 4.0–10.5)

## 2015-01-29 LAB — COMPLETE METABOLIC PANEL WITH GFR
ALK PHOS: 92 U/L (ref 39–117)
ALT: 18 U/L (ref 0–35)
AST: 16 U/L (ref 0–37)
Albumin: 4 g/dL (ref 3.5–5.2)
BUN: 11 mg/dL (ref 6–23)
CHLORIDE: 103 meq/L (ref 96–112)
CO2: 27 mEq/L (ref 19–32)
Calcium: 9.7 mg/dL (ref 8.4–10.5)
Creat: 0.87 mg/dL (ref 0.50–1.10)
GFR, EST AFRICAN AMERICAN: 78 mL/min
GFR, EST NON AFRICAN AMERICAN: 67 mL/min
Glucose, Bld: 92 mg/dL (ref 70–99)
Potassium: 3.8 mEq/L (ref 3.5–5.3)
SODIUM: 138 meq/L (ref 135–145)
TOTAL PROTEIN: 6.9 g/dL (ref 6.0–8.3)
Total Bilirubin: 0.5 mg/dL (ref 0.2–1.2)

## 2015-01-29 LAB — TSH: TSH: 1.702 u[IU]/mL (ref 0.350–4.500)

## 2015-01-29 MED ORDER — IPRATROPIUM-ALBUTEROL 0.5-2.5 (3) MG/3ML IN SOLN: 3.0000 mL | Freq: Three times a day (TID) | RESPIRATORY_TRACT | Status: DC

## 2015-01-29 MED ORDER — ALPRAZOLAM 0.5 MG PO TABS: 0.5000 mg | ORAL_TABLET | Freq: Every evening | ORAL | Status: DC | PRN

## 2015-01-29 MED ORDER — PREDNISONE 20 MG PO TABS: ORAL_TABLET | ORAL | Status: DC

## 2015-01-29 NOTE — Patient Instructions (Signed)
Great job on [employer] - keep up the good work. Continue not smoking - make an appt with pulmonologist for continued care Osteoporosis - we are going to schedule your none density - continue Calcium and Vit D

## 2015-01-29 NOTE — Progress Notes (Signed)
Subjective:    Patient ID: Rachel Perez, female    DOB: [birth year], 74 y.o.   MRN: [id number]  HPI Pt presents to clinic for her Medicare Wellness Exam - subsequent visit and chest congestion concerns.  She has been doing well.  She was supposed to travel next week but she is having problems with her breathing.  She has been told that Dr Gwenette Greet is leaving and she is planning on getting another pulmonologist at that practice.  She has been having trouble with her breathing for a while (several weeks) and she has been using mucinex and she is not getting a chance in her chest congestion.  She is unable to get up any mucus when she coughs.  She finds that she is more SOB esp in the am.  She will walk across her condo and she will be winded and she will have to sit down for a few minutes to catch her breath.  She finds that she is more tired that normal and her activity has decreased due to her SOB.  She is changing her plans due to her breathing issues.  She has a few times increased her Duoneb to tid and that has helped but she is going to run out of medication.  She has found that she is now sleeping with a few pillows because the chest congestion is so bad as well as the wheezing.  When she was like this last time Dr Gwenette Greet gave her prednisone and that helped a lot.  Only problem today is chest congestion - been taking blue mucinex - Dr Gwenette Greet would give her  - increase in inhaler use since the congestion is getting worse  Oxygen monitoring 85-86% after walking across the condo in the am - she has a pulse oximetry finger probe  Vision exam - appt next week Dental exam - 10/2014 Pap smear - not needed due to age Mammogram 5/16  Osteoporosis - stopped Fosamax 6 months ago - takes calcium daily 577m tid, Vit D 6000U daily  COPD - getting ready to get a new pulmonologist - will get spirometry through pulmonologist - uses medication daily -   Depression - stopped her Wellbutrin months ago and  thinks that she is doing ok.  She only cries at sappy commericals and TV shows.  She does not feel like she has a depressed mood.  Review of Systems  Constitutional: Negative.   HENT: Positive for congestion, dental problem (dry mouth with multiple dental cariies - sees the dentist regularly), hearing loss and tinnitus.        Normal for her  Eyes: Negative.   Respiratory: Positive for cough and wheezing (2nd to COPD).        None new - normal symptoms  Cardiovascular: Positive for leg swelling.  Gastrointestinal: Negative.   Endocrine: Negative.   Genitourinary: Negative.   Musculoskeletal: Negative.   Skin: Negative.   Allergic/Immunologic: Negative.   Neurological: Negative.   Hematological: Negative.   Psychiatric/Behavioral: The patient is nervous/anxious (situational - mom and family).    Patient Active Problem List   Diagnosis Date Noted  . COPD exacerbation - not currently controlled 07/03/2013  . History of tobacco use - still not smoking 05/09/2013  [facility]  . History of hypothyroidism - has not been on medications in years 09/30/2010  . Anxiety - uses her Xanax rarely 09/30/2010  . HEMORRHOIDS 09/30/2010  . COPD (chronic obstructive pulmonary disease) with emphysema 09/30/2010  .  Osteoporosis - stopped her medications but takes her Calcium and Vit D 09/30/2010   Prior to Admission medications   Medication Sig Start Date End Date Taking? Authorizing Provider  alendronate (FOSAMAX) 70 MG tablet Take 1 tablet (70 mg total) by mouth every 7 (seven) days. Take with a full glass of water on an empty stomach. Patient not taking: Reported on 01/29/2015 11/28/13   Mancel Bale, PA-C  ALPRAZolam Duanne Moron) 0.5 MG tablet Take 1 tablet (0.5 mg total) by mouth at bedtime as needed. 01/29/15  Yes Mancel Bale, PA-C  budesonide-formoterol (SYMBICORT) 160-4.5 MCG/ACT inhaler Inhale 2 puffs into the lungs 2 (two) times daily. 10/15/14  Yes Mancel Bale, PA-C  Calcium Carbonate  (CALCIUM 500 PO) Take 500 Units by mouth 3 (three) times daily.   Yes Historical Provider, MD  Cholecalciferol (VITAMIN D) 2000 UNITS tablet Take 6,000 Units by mouth daily.    Yes Historical Provider, MD  ipratropium-albuterol (DUONEB) 0.5-2.5 (3) MG/3ML SOLN Inhale 3 mLs into the lungs 3 (three) times daily. Inhale 1 vial via nebulizer twice a day. 01/29/15  Yes Mancel Bale, PA-C  PROAIR HFA 108 (90 BASE) MCG/ACT inhaler Inhale 1-2 puffs into the lungs every 6 (six) hours as needed for wheezing or shortness of breath. 01/03/14  Yes Mancel Bale, PA-C  SPIRIVA HANDIHALER 18 MCG inhalation capsule INHALE CONTENTS OF 1 CAPSULE VIA HANDIHALER AT THE SAME TIME EVERY DAY 08/13/14   Mancel Bale, PA-C   Allergies  Allergen Reactions  . Celebrex [Celecoxib]     Tendonitis  . Levaquin [Levofloxacin]     Tendonitis   . Penicillins Other (See Comments)    psorasis    Medications, allergies, past medical history, surgical history, family history, social history and problem list reviewed and updated.      Objective:   Physical Exam  Constitutional: She is oriented to person, place, and time. She appears well-developed and well-nourished.  BP 120/60 mmHg  Pulse 76  Temp(Src) 97.9 F (36.6 C) (Oral)  Resp 16  Ht 5' 7.25" (1.708 m)  Wt 178 lb 6.4 oz (80.922 kg)  BMI 27.74 kg/m2  SpO2 92%   HENT:  Head: Normocephalic and atraumatic.  Right Ear: Hearing, tympanic membrane, external ear and ear canal normal.  Left Ear: Hearing, tympanic membrane, external ear and ear canal normal.  Nose: Nose normal.  Mouth/Throat: Uvula is midline, oropharynx is clear and moist and mucous membranes are normal.  Eyes: Conjunctivae and EOM are normal. Pupils are equal, round, and reactive to light.  Neck: Trachea normal and normal range of motion. Neck supple. No thyroid mass and no thyromegaly present.  Cardiovascular: Normal rate, regular rhythm and normal heart sounds.   No murmur  heard. Pulmonary/Chest: Effort normal. She has wheezes (wheezing all lungs fields throughout expiration).  Pt has a cough that sounds productive.  We walked patient around the office and a 1 loop and her pulse ox fell to 85% and she was winded on room air.  Abdominal: Soft. Bowel sounds are normal.  Musculoskeletal: Normal range of motion.  Lymphadenopathy:    She has no cervical adenopathy.  Neurological: She is alert and oriented to person, place, and time. She has normal reflexes.  Skin: Skin is warm and dry.  Psychiatric: She has a normal mood and affect. Her behavior is normal. Judgment and thought content normal.    The patient does not have a history of falls. I did complete a risk assessment for  falls. A plan of care for falls was not documented.  Depression screen [facility] 2/9 01/29/2015 11/28/2013  Decreased Interest 0 1  Down, Depressed, Hopeless 0 1  PHQ - 2 Score 0 2  Altered sleeping - 1  Tired, decreased energy - 3  Change in appetite - 0  Feeling bad or failure about yourself  - 0  Trouble concentrating - 0  Moving slowly or fidgety/restless - 0  Suicidal thoughts - 0  PHQ-9 Score - 6    Visual Acuity Screening   Right eye Left eye Both eyes  Without correction: 20/40 20/40 20/40   With correction:      Results for orders placed or performed in visit on 01/29/15  CBC with Differential/Platelet  Result Value Ref Range   WBC 6.4 4.0 - 10.5 K/uL   RBC 4.65 3.87 - 5.11 MIL/uL   Hemoglobin 14.3 12.0 - 15.0 g/dL   HCT 41.3 36.0 - 46.0 %   MCV 88.8 78.0 - 100.0 fL   MCH 30.8 26.0 - 34.0 pg   MCHC 34.6 30.0 - 36.0 g/dL   RDW 13.5 11.5 - 15.5 %   Platelets 261 150 - 400 K/uL   MPV 11.4 8.6 - 12.4 fL   Neutrophils Relative % 69 43 - 77 %   Neutro Abs 4.4 1.7 - 7.7 K/uL   Lymphocytes Relative 21 12 - 46 %   Lymphs Abs 1.3 0.7 - 4.0 K/uL   Monocytes Relative 7 3 - 12 %   Monocytes Absolute 0.4 0.1 - 1.0 K/uL   Eosinophils Relative 2 0 - 5 %   Eosinophils Absolute 0.1  0.0 - 0.7 K/uL   Basophils Relative 1 0 - 1 %   Basophils Absolute 0.1 0.0 - 0.1 K/uL   Smear Review Criteria for review not met   COMPLETE METABOLIC PANEL WITH GFR  Result Value Ref Range   Sodium 138 135 - 145 mEq/L   Potassium 3.8 3.5 - 5.3 mEq/L   Chloride 103 96 - 112 mEq/L   CO2 27 19 - 32 mEq/L   Glucose, Bld 92 70 - 99 mg/dL   BUN 11 6 - 23 mg/dL   Creat 0.87 0.50 - 1.10 mg/dL   Total Bilirubin 0.5 0.2 - 1.2 mg/dL   Alkaline Phosphatase 92 39 - 117 U/L   AST 16 0 - 37 U/L   ALT 18 0 - 35 U/L   Total Protein 6.9 6.0 - 8.3 g/dL   Albumin 4.0 3.5 - 5.2 g/dL   Calcium 9.7 8.4 - 10.5 mg/dL   GFR, Est African American 78 mL/min   GFR, Est Non African American 67 mL/min  TSH  Result Value Ref Range   TSH 1.702 0.350 - 4.500 uIU/mL       Assessment & Plan:    Medicare annual wellness visit, subsequent - Weight improved - continue good habits Continue great job not smoking. Pt has power of attorney, living will, advanced directive  Annual physical exam  Osteoporosis - Plan: DG Bone Density  Depression  Situational anxiety  Screening, lipid - normal last year - will wait for the recommended 5 year screening  Screening for metabolic disorder - Plan: COMPLETE METABOLIC PANEL WITH GFR  Screening for deficiency anemia - Plan: CBC with Differential/Platelet  Screening for colon cancer - Plan: IFOBT POC (occult bld, rslt in office) - pt declines colonoscopy  H/O: hypothyroidism - Plan: TSH  Anxiety state - Continue prn use of Xanax. Plan: ALPRAZolam Duanne Moron)  0.5 MG tablet  COPD exacerbation - pulmonary emphysema -  Plan: predniSONE (DELTASONE) 20 MG tablet, ipratropium-albuterol (DUONEB) 0.5-2.5 (3) MG/3ML SOLN - we will increase her Duoneb to tid because she feels like she needs more help during the day. She has been changing her plans due to her breathing problems.  She is going to get an appt with the new pulmonologist and if the appt is more than a month she will  call me and I will   Windell Hummingbird PA-C  [facility] 02/01/2015 10:35 AM

## 2015-02-02 DIAGNOSIS — J438 Other emphysema: Secondary | ICD-10-CM | POA: Diagnosis not present

## 2015-02-07 LAB — IFOBT (OCCULT BLOOD): IMMUNOLOGICAL FECAL OCCULT BLOOD TEST: NEGATIVE

## 2015-02-12 ENCOUNTER — Telehealth: Payer: Self-pay | Admitting: *Deleted

## 2015-02-12 NOTE — Telephone Encounter (Signed)
Phoned & [facility] for patient to find out WHERE she had her most recent mammo (documentation with Maralyn SagoSarah tells a "when", but not a "where".

## 2015-02-20 DIAGNOSIS — Z1231 Encounter for screening mammogram for malignant neoplasm of breast: Secondary | ICD-10-CM | POA: Diagnosis not present

## 2015-02-20 DIAGNOSIS — Z7952 Long term (current) use of systemic steroids: Secondary | ICD-10-CM | POA: Diagnosis not present

## 2015-02-20 LAB — HM MAMMOGRAPHY

## 2015-02-20 LAB — HM DEXA SCAN

## 2015-02-26 ENCOUNTER — Encounter: Payer: Self-pay | Admitting: *Deleted

## 2015-02-26 NOTE — Telephone Encounter (Signed)
See mychart message sent to patient just now.

## 2015-02-27 ENCOUNTER — Telehealth: Payer: Self-pay | Admitting: *Deleted

## 2015-02-27 ENCOUNTER — Encounter: Payer: Self-pay | Admitting: *Deleted

## 2015-02-27 DIAGNOSIS — M810 Age-related osteoporosis without current pathological fracture: Secondary | ICD-10-CM

## 2015-02-27 NOTE — Telephone Encounter (Signed)
Received faxed reports from [facility] for 02/17/2015 results for patient's mammo & dexa scan.  Updated health maintenance & abstracted.

## 2015-03-05 DIAGNOSIS — J438 Other emphysema: Secondary | ICD-10-CM | POA: Diagnosis not present

## 2015-03-08 DIAGNOSIS — N3001 Acute cystitis with hematuria: Secondary | ICD-10-CM | POA: Diagnosis not present

## 2015-03-10 MED ORDER — ALENDRONATE SODIUM 70 MG PO TABS
70.0000 mg | ORAL_TABLET | ORAL | Status: DC
Start: 2015-03-10 — End: 2015-07-31

## 2015-03-10 NOTE — Telephone Encounter (Signed)
Please call patient - her bone density showed that her bone density has decreased since 2011 - probably because she went off the Fosamax.  I think that she should restart that medication and continue her Calcium - (you can tell her the stuff below about calcium) and her Vit D.  If she is willing let me know and I will send to the pharmacy.  In order for your supplemental calcium to be effective.  Please take calcium either on an empty stomach or with food that does not contain calcium.  Your body is able only to absorb 500mg  of Calcium at a time from any one source.  Do not take with a MVI with iron because iron does not allow th calcium to be absorbed.  Please take Calcium citrate 500mg  2-3x/day.  This supplement should have Vit D in it and if your pills do not please add addition Vit D 400 IU with each dose.

## 2015-03-10 NOTE — Addendum Note (Signed)
Addended by: Morrell RiddleWEBER, Shayleigh Bouldin L on: 03/10/2015 07:59 PM   Modules accepted: Orders

## 2015-03-10 NOTE — Telephone Encounter (Addendum)
Called and spoke to pt. She is fine with starting back on the Fosamax. Please send rx to [facility] on [address].

## 2015-03-19 ENCOUNTER — Ambulatory Visit (INDEPENDENT_AMBULATORY_CARE_PROVIDER_SITE_OTHER): Payer: Medicare Other | Admitting: Physician Assistant

## 2015-03-19 VITALS — BP 118/70 | HR 84 | Temp 98.0°F | Resp 16 | Ht 68.0 in | Wt 181.2 lb

## 2015-03-19 DIAGNOSIS — R300 Dysuria: Secondary | ICD-10-CM

## 2015-03-19 DIAGNOSIS — N3000 Acute cystitis without hematuria: Secondary | ICD-10-CM

## 2015-03-19 DIAGNOSIS — J439 Emphysema, unspecified: Secondary | ICD-10-CM | POA: Diagnosis not present

## 2015-03-19 DIAGNOSIS — J441 Chronic obstructive pulmonary disease with (acute) exacerbation: Secondary | ICD-10-CM

## 2015-03-19 LAB — POCT URINALYSIS DIPSTICK
Bilirubin, UA: NEGATIVE
GLUCOSE UA: NEGATIVE
Ketones, UA: NEGATIVE
LEUKOCYTES UA: NEGATIVE
NITRITE UA: NEGATIVE
Protein, UA: NEGATIVE
Spec Grav, UA: 1.02
UROBILINOGEN UA: 0.2
pH, UA: 5.5

## 2015-03-19 LAB — POCT UA - MICROSCOPIC ONLY
CASTS, UR, LPF, POC: NEGATIVE
CRYSTALS, UR, HPF, POC: NEGATIVE
Mucus, UA: NEGATIVE
Yeast, UA: NEGATIVE

## 2015-03-19 MED ORDER — SULFAMETHOXAZOLE-TRIMETHOPRIM 800-160 MG PO TABS: 1.0000 | ORAL_TABLET | Freq: Two times a day (BID) | ORAL | Status: DC

## 2015-03-19 MED ORDER — IPRATROPIUM-ALBUTEROL 0.5-2.5 (3) MG/3ML IN SOLN: RESPIRATORY_TRACT | Status: DC

## 2015-03-19 MED ORDER — PROAIR HFA 108 (90 BASE) MCG/ACT IN AERS: 1.0000 | INHALATION_SPRAY | Freq: Four times a day (QID) | RESPIRATORY_TRACT | Status: DC | PRN

## 2015-03-19 NOTE — Progress Notes (Signed)
Rachel CheeksJo Ann D Buley  MRN: [id number] DOB: [birth year]  Subjective:  Pt presents to clinic with dysuria for about 2 weeks.  [facility] was treat with Macrobid for 5 days and the next day her symptoms seemed to return - she was [facility] by [facility] that she had E coli and needed to f/u with her PCP but she is unsure why she needed f/u.  She otherwise feels fine.    Patient Active Problem List   Diagnosis Date Noted  . COPD exacerbation 07/03/2013  . History of tobacco use 05/09/2013  [facility]  . History of hypothyroidism 09/30/2010  . Anxiety 09/30/2010  . HEMORRHOIDS 09/30/2010  . COPD (chronic obstructive pulmonary disease) with emphysema 09/30/2010  . Osteoporosis 09/30/2010    Current Outpatient Prescriptions on File Prior to Visit  Medication Sig Dispense Refill  . alendronate (FOSAMAX) 70 MG tablet Take 1 tablet (70 mg total) by mouth every 7 (seven) days. Take with a full glass of water on an empty stomach. 4 tablet 11  . ALPRAZolam (XANAX) 0.5 MG tablet Take 1 tablet (0.5 mg total) by mouth at bedtime as needed. 30 tablet 0  . budesonide-formoterol (SYMBICORT) 160-4.5 MCG/ACT inhaler Inhale 2 puffs into the lungs 2 (two) times daily. 1 Inhaler 11  . Calcium Carbonate (CALCIUM 500 PO) Take 500 Units by mouth 3 (three) times daily.    . Cholecalciferol (VITAMIN D) 2000 UNITS tablet Take 6,000 Units by mouth daily.     Marland Kitchen. ipratropium-albuterol (DUONEB) 0.5-2.5 (3) MG/3ML SOLN Inhale 3 mLs into the lungs 3 (three) times daily. Inhale 1 vial via nebulizer twice a day. 270 mL 11  . PROAIR HFA 108 (90 BASE) MCG/ACT inhaler Inhale 1-2 puffs into the lungs every 6 (six) hours as needed for wheezing or shortness of breath. 18 g 11  . SPIRIVA HANDIHALER 18 MCG inhalation capsule INHALE CONTENTS OF 1 CAPSULE VIA HANDIHALER AT THE SAME TIME EVERY DAY 90 capsule 1  . predniSONE (DELTASONE) 20 MG tablet 4 po x 2 days, 3 po x 2 days, 2 po qd x 2 days, 1 po qd  for 2 days (Patient not taking: Reported on 03/19/2015) 20 tablet 0   No current facility-administered medications on file prior to visit.    Allergies  Allergen Reactions  . Celebrex [Celecoxib]     Tendonitis  . Levaquin [Levofloxacin]     Tendonitis   . Penicillins Other (See Comments)    psorasis    Review of Systems  Constitutional: Negative for fever and chills.  Gastrointestinal: Negative for abdominal pain.  Genitourinary: Positive for dysuria and frequency. Negative for urgency.   Objective:  BP 118/70 mmHg  Pulse 84  Temp(Src) 98 F (36.7 C) (Oral)  Resp 16  Ht 5\' 8"  (1.727 m)  Wt 181 lb 3.2 oz (82.192 kg)  BMI 27.56 kg/m2  SpO2 96%  Physical Exam  Constitutional: She is oriented to person, place, and time and well-developed, well-nourished, and in no distress.  HENT:  Head: Normocephalic and atraumatic.  Right Ear: External ear normal.  Left Ear: External ear normal.  Cardiovascular: Normal rate, regular rhythm and normal heart sounds.   No murmur heard. Pulmonary/Chest: Effort normal and breath sounds normal.  Abdominal: Soft. There is no tenderness. There is no CVA tenderness.  Neurological: She is alert and oriented to person, place, and time. Gait normal.  Skin: Skin is warm and dry.  Psychiatric: Mood, memory, affect  and judgment normal.  Vitals reviewed.  Results for orders placed or performed in visit on 03/19/15  POCT UA - Microscopic Only  Result Value Ref Range   WBC, Ur, HPF, POC 10-20    RBC, urine, microscopic 1-3    Bacteria, U Microscopic trace    Mucus, UA neg    Epithelial cells, urine per micros 4-6    Crystals, Ur, HPF, POC neg    Casts, Ur, LPF, POC neg    Yeast, UA neg   POCT urinalysis dipstick  Result Value Ref Range   Color, UA yellow    Clarity, UA slightly cloudy    Glucose, UA neg    Bilirubin, UA neg    Ketones, UA neg    Spec Grav, UA 1.020    Blood, UA trace    pH, UA 5.5    Protein, UA neg    Urobilinogen, UA  0.2    Nitrite, UA neg    Leukocytes, UA Negative Negative    Assessment and Plan :  Burning with urination - Plan: POCT UA - Microscopic Only, POCT urinalysis dipstick, Urine culture  Acute cystitis without hematuria - Plan: sulfamethoxazole-trimethoprim (BACTRIM DS,SEPTRA DS) 800-160 MG per tablet  Push fluids.  Suggested pt try some cranberry supplements to help with prevention of UTIs in the future.  Benny Lennert PA-C  [facility] 03/19/2015 8:26 PM

## 2015-03-21 LAB — URINE CULTURE

## 2015-03-24 ENCOUNTER — Other Ambulatory Visit: Payer: Self-pay | Admitting: Physician Assistant

## 2015-04-04 DIAGNOSIS — J438 Other emphysema: Secondary | ICD-10-CM | POA: Diagnosis not present

## 2015-04-11 ENCOUNTER — Other Ambulatory Visit: Payer: Self-pay | Admitting: Physician Assistant

## 2015-04-21 ENCOUNTER — Other Ambulatory Visit: Payer: Self-pay | Admitting: Physician Assistant

## 2015-05-05 DIAGNOSIS — J438 Other emphysema: Secondary | ICD-10-CM | POA: Diagnosis not present

## 2015-06-05 DIAGNOSIS — J438 Other emphysema: Secondary | ICD-10-CM | POA: Diagnosis not present

## 2015-06-26 ENCOUNTER — Ambulatory Visit (INDEPENDENT_AMBULATORY_CARE_PROVIDER_SITE_OTHER): Payer: Medicare Other | Admitting: Pulmonary Disease

## 2015-06-26 ENCOUNTER — Ambulatory Visit (INDEPENDENT_AMBULATORY_CARE_PROVIDER_SITE_OTHER)
Admission: RE | Admit: 2015-06-26 | Discharge: 2015-06-26 | Disposition: A | Discharge status: Home / Self Care | Payer: Medicare Other | Source: Ambulatory Visit | Attending: Pulmonary Disease | Admitting: Pulmonary Disease

## 2015-06-26 ENCOUNTER — Encounter: Payer: Self-pay | Admitting: Pulmonary Disease

## 2015-06-26 VITALS — BP 128/66 | HR 90 | Ht 67.0 in | Wt 173.0 lb

## 2015-06-26 DIAGNOSIS — J479 Bronchiectasis, uncomplicated: Secondary | ICD-10-CM | POA: Diagnosis not present

## 2015-06-26 DIAGNOSIS — J439 Emphysema, unspecified: Secondary | ICD-10-CM

## 2015-06-26 DIAGNOSIS — R0600 Dyspnea, unspecified: Secondary | ICD-10-CM | POA: Insufficient documentation

## 2015-06-26 DIAGNOSIS — Z23 Encounter for immunization: Secondary | ICD-10-CM | POA: Diagnosis not present

## 2015-06-26 DIAGNOSIS — R0602 Shortness of breath: Secondary | ICD-10-CM

## 2015-06-26 MED ORDER — AEROCHAMBER MV MISC: Status: AC

## 2015-06-26 MED ORDER — FLUTTER DEVI: Status: AC

## 2015-06-26 NOTE — Assessment & Plan Note (Signed)
She has been experiencing worsening dyspnea over the last year which is likely in keeping with the natural history of COPD. Further, on exam she has poor air movement and some mild wheezing. So I think the most likely explanation for her worsening dyspnea is worsening COPD. However, I explained to her that the differential diagnosis of COPD is broad and so it would be worthwhile to check for other evidence of lung disease with a repeat lung function test as well as a chest x-ray.  Further, she does have this abnormality on her CT chest and chest x-ray which is consistent with a right middle lobe scar. She attributes this to a severe episode of pneumonia when she was hospitalized at age 89. I suppose that this could be causing bronchiectasis though she doesn't produce much mucus.  Plan: Pulmonary function test Chest x-ray Continue treatment for COPD from as written for now Ambulatory oximetry test Follow-up in 2-4 weeks

## 2015-06-26 NOTE — Assessment & Plan Note (Signed)
As detailed above 

## 2015-06-26 NOTE — Patient Instructions (Signed)
We will call you with the results of the chest x-ray in the lung function test Take Symbicort twice a day with a spacer We will see you back in 2-4 weeks or sooner if needed

## 2015-06-26 NOTE — Progress Notes (Signed)
Subjective:    Patient ID: Rachel Perez, female    DOB: 12-11-1940, 74 y.o.   MRN: [id number]  Synopsis: Former patient of Dr. Shelle Iron who has COPD. PFT's 11/2010:  FEV1 0.76, ratio 32, 47% improvement with BD, no restriction, +airtrapping, DLCO 53%.   HPI Chief Complaint  Patient presents with  . Follow-up    former [facility] pt last seen 07/2013 for COPD.  pt c/o worsening SOB.  Denies cough, mucus production, chest pain.      This is a pleasant 74 year old lady who smoked 2 packs of cigarettes daily from age 61 until age 76. She was hospitalized in 2014 with a COPD exacerbation. She has been taking Symbicort and Spiriva since then and quit smoking in July 2014 after that hospitalization. She was discharged home on oxygen but she was was never told what to do with that afterwards. She says that she monitors her O2 saturation at home and it will sometimes go into the 80s but for the most part it's normal. She has noticed worsening dyspnea over the last year. She says that this has been associated with ambulation primarily. She will sometimes wake up in the middle the night gasping for air. She has had mild swelling in the right ankle. She has a cough on a daily basis and some mucus production which is worse some days more than others. She sometimes sleeps with the oxygen that she has at home. She uses Symbicort 2 puffs twice a day as well as Spiriva. She's been using these medications routinely she thinks for 5 years and she's not sure that they're making much of a difference. She has not had an exacerbation of COPD requiring treatment with prednisone or antibiotics since 2014.  Past Medical History  Diagnosis Date  . COPD (chronic obstructive pulmonary disease) (HCC)   . H/O bladder infections   . Anxiety   . Arthritis   . Depression   . Osteoporosis   . Thyroid disease       Review of Systems  Constitutional: Positive for fatigue. Negative for fever and chills.  HENT: Negative for  postnasal drip, rhinorrhea and sinus pressure.   Respiratory: Positive for cough, shortness of breath and wheezing.   Cardiovascular: Negative for chest pain, palpitations and leg swelling.       Objective:   Physical Exam Filed Vitals:   06/26/15 1552  BP: 128/66  Pulse: 90  Height:  (1.702 m)  Weight: 173 lb (78.472 kg)  SpO2: 92%  RA  Ambulated 350 feet on room air, O2 saturation dropped transiently to 88% but did not go lower  Gen: well appearing, no acute distress HENT: NCAT, OP clear, neck supple without masses Eyes: PERRL, EOMi Lymph: no cervical lymphadenopathy PULM: Poor air movement, some mild wheezing, normal effort CV: RRR, no mgr, no JVD GI: BS+, soft, nontender, no hsm Derm: no rash or skin breakdown MSK: normal bulk and tone Neuro: A&Ox4, CN II-XII intact, strength 5/5 in all 4 extremities Psyche: normal mood and affect    2014 chest x-ray images reviewed showing a chronic appearing right middle lobe infiltrate 2012 CT chest images personally reviewed showing mild centrilobular emphysema as well as scarring in the right middle lobe.     Assessment & Plan:  COPD (chronic obstructive pulmonary disease) with emphysema (HCC) She has been experiencing worsening dyspnea over the last year which is likely in keeping with the natural history of COPD. Further, on exam she has poor  air movement and some mild wheezing. So I think the most likely explanation for her worsening dyspnea is worsening COPD. However, I explained to her that the differential diagnosis of COPD is broad and so it would be worthwhile to check for other evidence of lung disease with a repeat lung function test as well as a chest x-ray.  Further, she does have this abnormality on her CT chest and chest x-ray which is consistent with a right middle lobe scar. She attributes this to a severe episode of pneumonia when she was hospitalized at age 74. I suppose that this could be causing bronchiectasis  though she doesn't produce much mucus.  Plan: Pulmonary function test Chest x-ray Continue treatment for COPD from as written for now Ambulatory oximetry test Follow-up in 2-4 weeks    Dyspnea As detailed above   > 25 minutes spent in face to face conversation as part of a 45 minute   Current outpatient prescriptions:  .  ALPRAZolam (XANAX) 0.5 MG tablet, Take 1 tablet (0.5 mg total) by mouth at bedtime as needed., Disp: 30 tablet, Rfl: 0 .  budesonide-formoterol (SYMBICORT) 160-4.5 MCG/ACT inhaler, Inhale 2 puffs into the lungs 2 (two) times daily., Disp: 1 Inhaler, Rfl: 11 .  Calcium Carbonate (CALCIUM 500 PO), Take 500 Units by mouth 3 (three) times daily., Disp: , Rfl:  .  Cholecalciferol (VITAMIN D) 2000 UNITS tablet, Take 6,000 Units by mouth daily. , Disp: , Rfl:  .  ipratropium-albuterol (DUONEB) 0.5-2.5 (3) MG/3ML SOLN, 1 vial tid, Disp: 270 mL, Rfl: 11 .  PROAIR HFA 108 (90 BASE) MCG/ACT inhaler, Inhale 1-2 puffs into the lungs every 6 (six) hours as needed for wheezing or shortness of breath., Disp: 18 g, Rfl: 11 .  SPIRIVA HANDIHALER 18 MCG inhalation capsule, INHALE CONTENTS OF 1 CAPSULE VIA HANDIHALER AT THE SAME TIME EVERY DAY, Disp: 90 capsule, Rfl: 1 .  alendronate (FOSAMAX) 70 MG tablet, Take 1 tablet (70 mg total) by mouth every 7 (seven) days. Take with a full glass of water on an empty stomach. (Patient not taking: Reported on 06/26/2015), Disp: 4 tablet, Rfl: 11 .  Respiratory Therapy Supplies (FLUTTER) DEVI, Use as directed, Disp: 1 each, Rfl: 0 .  Spacer/Aero-Holding Chambers (AEROCHAMBER MV) inhaler, Use as instructed, Disp: 1 each, Rfl: 0

## 2015-06-27 NOTE — Progress Notes (Signed)
Quick Note:  Called, spoke with pt. Discussed cxr results per Dr. Kendrick FriesMcQuaid. Pt verbalized understanding and voiced no further questions or concerns at this time. ______

## 2015-07-01 DIAGNOSIS — J438 Other emphysema: Secondary | ICD-10-CM | POA: Diagnosis not present

## 2015-07-01 DIAGNOSIS — J449 Chronic obstructive pulmonary disease, unspecified: Secondary | ICD-10-CM | POA: Diagnosis not present

## 2015-07-05 DIAGNOSIS — J438 Other emphysema: Secondary | ICD-10-CM | POA: Diagnosis not present

## 2015-07-17 ENCOUNTER — Ambulatory Visit (HOSPITAL_COMMUNITY)
Admission: RE | Admit: 2015-07-17 | Discharge: 2015-07-17 | Disposition: A | Discharge status: Home / Self Care | Payer: Medicare Other | Source: Ambulatory Visit | Attending: Pulmonary Disease | Admitting: Pulmonary Disease

## 2015-07-17 ENCOUNTER — Ambulatory Visit (INDEPENDENT_AMBULATORY_CARE_PROVIDER_SITE_OTHER): Payer: Medicare Other | Admitting: Pulmonary Disease

## 2015-07-17 ENCOUNTER — Encounter: Payer: Self-pay | Admitting: Pulmonary Disease

## 2015-07-17 VITALS — BP 122/68 | HR 90 | Temp 97.9°F | Ht 67.0 in | Wt 173.2 lb

## 2015-07-17 DIAGNOSIS — J439 Emphysema, unspecified: Secondary | ICD-10-CM | POA: Diagnosis not present

## 2015-07-17 DIAGNOSIS — R0602 Shortness of breath: Secondary | ICD-10-CM | POA: Insufficient documentation

## 2015-07-17 LAB — PULMONARY FUNCTION TEST
FEF 25-75 Post: 0.36 L/sec
FEF 25-75 Pre: 0.27 L/sec
FEF2575-%CHANGE-POST: 31 %
FEF2575-%Pred-Post: 18 %
FEF2575-%Pred-Pre: 13 %
FEV1-%Change-Post: 12 %
FEV1-%PRED-POST: 29 %
FEV1-%PRED-PRE: 26 %
FEV1-Post: 0.75 L
FEV1-Pre: 0.66 L
FEV1FVC-%Change-Post: -15 %
FEV1FVC-%Pred-Pre: 48 %
FEV6-%CHANGE-POST: 10 %
FEV6-%PRED-POST: 59 %
FEV6-%Pred-Pre: 53 %
FEV6-POST: 1.87 L
FEV6-PRE: 1.68 L
FEV6FVC-%Change-Post: -17 %
FEV6FVC-%PRED-POST: 80 %
FEV6FVC-%PRED-PRE: 96 %
FVC-%Change-Post: 33 %
FVC-%Pred-Post: 74 %
FVC-%Pred-Pre: 55 %
FVC-POST: 2.45 L
FVC-Pre: 1.83 L
POST FEV1/FVC RATIO: 31 %
POST FEV6/FVC RATIO: 76 %
Pre FEV1/FVC ratio: 36 %
Pre FEV6/FVC Ratio: 92 %
RV % pred: 358 %
RV: 8.54 L
TLC % PRED: 192 %
TLC: 10.56 L

## 2015-07-17 MED ORDER — ALBUTEROL SULFATE (2.5 MG/3ML) 0.083% IN NEBU
2.5000 mg | INHALATION_SOLUTION | Freq: Once | RESPIRATORY_TRACT | Status: AC
  Administered 2015-07-17: 2.5 mg via RESPIRATORY_TRACT

## 2015-07-17 NOTE — Progress Notes (Signed)
Subjective:    Patient ID: Rachel Perez, female    DOB: 31-Oct-1940, 74 y.o.   MRN: [id number]  Synopsis: Former patient of Dr. Shelle Iron who has COPD. PFT's 11/2010:  FEV1 0.76, ratio 32, 47% improvement with BD, no restriction, +airtrapping, DLCO 53%.  November 2016 pulmonary function testing ratio 31%, FEV1 0.75 L (29% predicted, 12% change but not significant), total lung capacity 10.56 L (192% predicted, residual volume 8.54 L (358% predicted), DLCO 8.62 (30% predicted)  HPI Chief Complaint  Patient presents with  . 3 wk follow up    with PFTs.  SOB unchanged.  Chest congestion x 2 days with clear mucus.  Feels spacer with Symibort is helping.     Her dyspnea is about the same. She could not afford pulmonary rehab last time due to money. Otherwise she is doing OK. Still shot of breath. Her insurance has changed and her expenses are cheaper.   Past Medical History  Diagnosis Date  . COPD (chronic obstructive pulmonary disease) (HCC)   . H/O bladder infections   . Anxiety   . Arthritis   . Depression   . Osteoporosis   . Thyroid disease       Review of Systems  Constitutional: Positive for fatigue. Negative for fever and chills.  HENT: Negative for postnasal drip, rhinorrhea and sinus pressure.   Respiratory: Positive for shortness of breath. Negative for cough and wheezing.   Cardiovascular: Negative for chest pain, palpitations and leg swelling.       Objective:   Physical Exam Filed Vitals:   07/17/15 1327  BP: 122/68  Pulse: 90  Temp: 97.9 F (36.6 C)  TempSrc: Oral  Height:  (1.702 m)  Weight: 173 lb 3.2 oz (78.563 kg)  SpO2: 91%  RA  Gen: well appearing HENT: OP clear, TM's clear, neck supple PULM: Wheezing in bases B, normal percussion CV: RRR, no mgr, trace edema GI: BS+, soft, nontender Derm: no cyanosis or rash Psyche: normal mood and affect     2016 CXR images personally reviewed showing emphysema, RML scar     Assessment &  Plan:  COPD (chronic obstructive pulmonary disease) with emphysema (HCC) I have personally reviewed the images from her chest x-ray and have reviewed her lung function testing from today. She has very severe COPD with a dramatic amount of air trapping. This is no doubt why she has been experiencing more shortness of breath. I explained to her that we could pursue more workup with a cardiac and/or anemia evaluation but considering the severity of her disease the best approach at this point is to focus on pulmonary rehabilitation.  Plan: Pulmonary rehabilitation Continue Symbicort with a spacer and Spiriva  Continue as needed albuterol Flu shot is up-to-date Follow-up 4-6 months or sooner if needed    Current outpatient prescriptions:  .  ALPRAZolam (XANAX) 0.5 MG tablet, Take 1 tablet (0.5 mg total) by mouth at bedtime as needed., Disp: 30 tablet, Rfl: 0 .  budesonide-formoterol (SYMBICORT) 160-4.5 MCG/ACT inhaler, Inhale 2 puffs into the lungs 2 (two) times daily., Disp: 1 Inhaler, Rfl: 11 .  Calcium Carbonate (CALCIUM 500 PO), Take 500 Units by mouth 3 (three) times daily., Disp: , Rfl:  .  Cholecalciferol (VITAMIN D) 2000 UNITS tablet, Take 6,000 Units by mouth daily. , Disp: , Rfl:  .  ipratropium-albuterol (DUONEB) 0.5-2.5 (3) MG/3ML SOLN, 1 vial tid, Disp: 270 mL, Rfl: 11 .  PROAIR HFA 108 (90 BASE) MCG/ACT inhaler, Inhale  1-2 puffs into the lungs every 6 (six) hours as needed for wheezing or shortness of breath., Disp: 18 g, Rfl: 11 .  Respiratory Therapy Supplies (FLUTTER) DEVI, Use as directed, Disp: 1 each, Rfl: 0 .  Spacer/Aero-Holding Chambers (AEROCHAMBER MV) inhaler, Use as instructed, Disp: 1 each, Rfl: 0 .  SPIRIVA HANDIHALER 18 MCG inhalation capsule, INHALE CONTENTS OF 1 CAPSULE VIA HANDIHALER AT THE SAME TIME EVERY DAY, Disp: 90 capsule, Rfl: 1

## 2015-07-17 NOTE — Addendum Note (Signed)
Addended by: Gweneth DimitriJONES, Kissy Cielo D on: 07/17/2015 01:52 PM   Modules accepted: Orders

## 2015-07-17 NOTE — Assessment & Plan Note (Signed)
I have personally reviewed the images from her chest x-ray and have reviewed her lung function testing from today. She has very severe COPD with a dramatic amount of air trapping. This is no doubt why she has been experiencing more shortness of breath. I explained to her that we could pursue more workup with a cardiac and/or anemia evaluation but considering the severity of her disease the best approach at this point is to focus on pulmonary rehabilitation.  Plan: Pulmonary rehabilitation Continue Symbicort with a spacer and Spiriva  Continue as needed albuterol Flu shot is up-to-date Follow-up 4-6 months or sooner if needed

## 2015-07-17 NOTE — Patient Instructions (Signed)
We will refer you to pulmonary rehabilitation Keep taking her medications as you're doing Use generic Zyrtec (cetirizine) as needed for allergic rhinitis We will see you back in 4-6 months or sooner if needed

## 2015-07-21 ENCOUNTER — Other Ambulatory Visit: Payer: Self-pay

## 2015-07-21 DIAGNOSIS — M810 Age-related osteoporosis without current pathological fracture: Secondary | ICD-10-CM

## 2015-07-28 ENCOUNTER — Encounter (HOSPITAL_COMMUNITY): Payer: Self-pay

## 2015-07-28 ENCOUNTER — Encounter (HOSPITAL_COMMUNITY)
Admission: RE | Admit: 2015-07-28 | Discharge: 2015-07-28 | Disposition: A | Discharge status: Home / Self Care | Payer: Medicare Other | Source: Ambulatory Visit | Attending: Pulmonary Disease | Admitting: Pulmonary Disease

## 2015-07-28 VITALS — BP 138/63 | HR 95 | Resp 20 | Ht 67.75 in | Wt 174.8 lb

## 2015-07-28 DIAGNOSIS — J438 Other emphysema: Secondary | ICD-10-CM

## 2015-07-28 DIAGNOSIS — J439 Emphysema, unspecified: Secondary | ICD-10-CM | POA: Diagnosis not present

## 2015-07-28 NOTE — Progress Notes (Addendum)
Rachel PicklesJo Ann D Perez 74 y.o. female Pulmonary Rehab Orientation Note [phone] [id number] Patient arrived today in Cardiac and Pulmonary Rehab for orientation to Pulmonary Rehab. [facility] via wheel chair. She has not been prescribed oxygen for home or portable use. Color good, skin warm and dry. Patient is oriented to time and place. Patient's medical history, psychosocial health, and medications reviewed. Psychosocial assessment reveals pt lives alone with her cat. Pt is currently retired. She has held many different jobs over the years, including working for several trucking companies and [employer]. Pt hobbies include gardening. [place] but because of her health issues she was only able to visit 5 times this past summer. She hopes to regain enough stamina and stregnth to enjoy the lake more in the summer to come. She also enjoys social outings with her friends and neighbors. They have a book club in their condo neighborhood and they also enjoy going out to eat together. She attends Friday Night Football on a regular basis to see her granddaughter cheer. This brings her great pleasure. Pt reports her stress level is low. Areas of stress/anxiety include Health.  Pt does exhibits some signs of depression. Signs of depression include crying spells and early morning awakenings. She has taken Welbutrin in the past and states she is going to call her PCP for an updated prescription. She feels a lot of her sadness comes from her inability to maintain her home as she used to. She does not feel she need counceling at this time. PHQ2/9 score 4/11. Pt shows fair  coping skills with positive outlook . She was offered emotional support and reassurance. Will continue to monitor and evaluate progress toward psychosocial goal(s) of managing sadness and sleeplessness. Physical assessment reveals heart rate is normal, S1S2 present. Breath sounds diminished. No rales, or  rhonchi, mild expiratory wheezing heard LUL. Productive, wet cough present. Patient describes clear thick sputum. Patient encouraged to use flutter valve at home and to cough and deep breath. Grip strength equal, strong. Distal pulses palpable. Mild non-pitting edema noted to both ankles, legs, and feet; R>L. Patient reports she does take medications as prescribed. Patient states she follows a Regular diet. The patient reports no specific efforts to gain or lose weight.. Patient's weight will be monitored closely. Demonstration and practice of PLB using pulse oximeter. Patient able to return demonstration satisfactorily. Safety and hand hygiene in the exercise area reviewed with patient. Patient voices understanding of the information reviewed. Department expectations discussed with patient and achievable goals were set. The patient shows enthusiasm about attending the program and we look forward to working with this nice lady. The patient is scheduled for a 6 min walk test on Tuesday 11/15 at 3:45 and to begin exercise on Thursday 11/17 in the 1:30 class.   45 minutes was spent on a variety of activities such as assessment of the patient, obtaining baseline data including height, weight, BMI, and grip strength, verifying medical history, allergies, and current medications, and teaching patient strategies for performing tasks with less respiratory effort with emphasis on pursed lip breathing.

## 2015-07-29 ENCOUNTER — Encounter (HOSPITAL_COMMUNITY)
Admission: RE | Admit: 2015-07-29 | Discharge: 2015-07-29 | Disposition: A | Discharge status: Home / Self Care | Payer: Medicare Other | Source: Ambulatory Visit | Attending: Pulmonary Disease | Admitting: Pulmonary Disease

## 2015-07-29 DIAGNOSIS — J439 Emphysema, unspecified: Secondary | ICD-10-CM | POA: Diagnosis not present

## 2015-07-31 ENCOUNTER — Encounter (HOSPITAL_COMMUNITY)
Admission: RE | Admit: 2015-07-31 | Discharge: 2015-07-31 | Disposition: A | Discharge status: Home / Self Care | Payer: Medicare Other | Source: Ambulatory Visit | Attending: Pulmonary Disease | Admitting: Pulmonary Disease

## 2015-07-31 ENCOUNTER — Other Ambulatory Visit: Payer: Self-pay

## 2015-07-31 DIAGNOSIS — M810 Age-related osteoporosis without current pathological fracture: Secondary | ICD-10-CM

## 2015-07-31 DIAGNOSIS — J439 Emphysema, unspecified: Secondary | ICD-10-CM | POA: Diagnosis not present

## 2015-07-31 MED ORDER — ALENDRONATE SODIUM 70 MG PO TABS: 70.0000 mg | ORAL_TABLET | ORAL | Status: DC

## 2015-07-31 NOTE — Progress Notes (Signed)
Pt completed Quality of Life survey as a participant in Cardiac Rehab. Scores 21.0 or below are considered low. Pt score very low in several areas Overall 20.50, Health and Function 17.79, physiological and spiritual 20.64, family 24.0.  We discussed that she has made some lifestyle changes due to her health, but she is coping well.  She awakens early from sleep and has trouble going back to sleep.  She has been on wellbutrin in the past and will discuss with her primary care physician if she feels she needs help with her depression.  She feels her health has been her sole source of sterss and depression.  She does not need to be referred to a mental health care professional at this time.

## 2015-07-31 NOTE — Progress Notes (Signed)
Today, Rachel Perez exercised at [facility]. Service time was from 1:30pm to 3:45pm.  The patient exercised by performing aerobic, strengthening, and stretching exercises. Oxygen saturation, heart rate, blood pressure, rate of perceived exertion, and shortness of breath were all monitored before, during, and after exercise. Rachel Perez presented with no problems at today's exercise session. The patient attended education today with [facility] Jacksyn Beeks on Exercise for the Pulmonary Patient.   The patient did not have an increase in workload intensity during today's exercise session.  Pre-exercise vitals: . Weight kg: 79.4 . Liters of O2: ra . SpO2: 90 . HR: 86 . BP: 122/62 . CBG: na  Exercise vitals: . Highest heartrate:  102 . Lowest oxygen saturation: 84 . Highest blood pressure: 122/60 . Liters of 02: ra  Post-exercise vitals: . SpO2: 95 . HR: 93 . BP: 114/66 . Liters of O2: a . CBG: na  Dr. Kalman ShanMurali Ramaswamy, Medical Director Dr. Elvera LennoxGherghe is immediately available during today's Pulmonary Rehab session for Rachel Perez on 07/31/15 at 1:30pm class time.

## 2015-07-31 NOTE — Progress Notes (Signed)
Rachel Perez completed a Six-Minute Walk Test on 07/29/15 . Rachel Perez walked 806 feet with 1 break.  The patient's lowest oxygen saturation was 86 %, highest heart rate was 101 bpm , and highest blood pressure was 142/70. The patient was on room air. Patient stated that nothing hindered their walk test.

## 2015-08-05 ENCOUNTER — Encounter (HOSPITAL_COMMUNITY)
Admission: RE | Admit: 2015-08-05 | Discharge: 2015-08-05 | Disposition: A | Discharge status: Home / Self Care | Payer: Medicare Other | Source: Ambulatory Visit | Attending: Pulmonary Disease | Admitting: Pulmonary Disease

## 2015-08-05 DIAGNOSIS — J439 Emphysema, unspecified: Secondary | ICD-10-CM | POA: Diagnosis not present

## 2015-08-05 DIAGNOSIS — J438 Other emphysema: Secondary | ICD-10-CM | POA: Diagnosis not present

## 2015-08-05 NOTE — Progress Notes (Signed)
Today, Rachel Perez exercised at [facility]. Service time was from 1330 to 1535.  The patient exercised by performing aerobic, strengthening, and stretching exercises. Oxygen saturation, heart rate, blood pressure, rate of perceived exertion, and shortness of breath were all monitored before, during, and after exercise. Rachel Perez presented with no problems at today's exercise session.She attended oxygen safety class today.  The patient did not have an increase in workload intensity during today's exercise session.  Pre-exercise vitals: . Weight kg: 78.5 . Liters of O2: RA . SpO2: 92 . HR: 79 . BP: 122/56 . CBG: NA  Exercise vitals: . Highest heartrate:  98 . Lowest oxygen saturation: 90 . Highest blood pressure: 120/70 . Liters of 02: 2  Post-exercise vitals: . SpO2: 95 . HR: 86 . BP: 124/60 . Liters of O2: 2 . CBG: NA Dr. Kalman ShanMurali Ramaswamy, Medical Director Dr. Isidoro Donningai is immediately available during today's Pulmonary Rehab session for Rachel Perez on 08/05/2015  at 1330 class time.  Marland Kitchen.  .Marland Kitchen

## 2015-08-12 ENCOUNTER — Encounter (HOSPITAL_COMMUNITY)
Admission: RE | Admit: 2015-08-12 | Discharge: 2015-08-12 | Disposition: A | Discharge status: Home / Self Care | Payer: Medicare Other | Source: Ambulatory Visit | Attending: Pulmonary Disease | Admitting: Pulmonary Disease

## 2015-08-12 DIAGNOSIS — J439 Emphysema, unspecified: Secondary | ICD-10-CM | POA: Diagnosis not present

## 2015-08-12 NOTE — Progress Notes (Signed)
Today, Rachel Perez exercised at [facility]. Service time was from 1330 to 1455.  The patient exercised by performing aerobic, strengthening, and stretching exercises. Oxygen saturation, heart rate, blood pressure, rate of perceived exertion, and shortness of breath were all monitored before, during, and after exercise. Rachel Perez presented with no problems at today's exercise session.  The patient did not have an increase in workload intensity during today's exercise session.  Pre-exercise vitals: . Weight kg: 78.8 . Liters of O2: RA . SpO2: 91 . HR: 88 . BP: 126/70 . CBG: NA  Exercise vitals: . Highest heartrate:  101 . Lowest oxygen saturation: 90 . Highest blood pressure: 124/60 . Liters of 02: 2  Post-exercise vitals: . SpO2: 96 . HR: 99 . BP: 130/60 . Liters of O2: 2 . CBG: NA Dr. Kalman ShanMurali Ramaswamy, Medical Director Dr. Carmell Austriaegaldo is immediately available during today's Pulmonary Rehab session for Rachel Perez on 08/12/2015  at 1330 class time.  .Marland Kitchen

## 2015-08-14 ENCOUNTER — Encounter (HOSPITAL_COMMUNITY)
Admission: RE | Admit: 2015-08-14 | Discharge: 2015-08-14 | Disposition: A | Discharge status: Home / Self Care | Payer: Medicare Other | Source: Ambulatory Visit | Attending: Pulmonary Disease | Admitting: Pulmonary Disease

## 2015-08-14 DIAGNOSIS — J439 Emphysema, unspecified: Secondary | ICD-10-CM | POA: Insufficient documentation

## 2015-08-14 NOTE — Progress Notes (Signed)
Today, Rachel Perez exercised at [facility]. Service time was from 1330 to 1530.  The patient exercised by performing aerobic, strengthening, and stretching exercises. Oxygen saturation, heart rate, blood pressure, rate of perceived exertion, and shortness of breath were all monitored before, during, and after exercise. Rachel Perez presented with no problems at today's exercise session. Rachel Perez also attended an education session on warning signs and symptoms.  The patient did not have an increase in workload intensity during today's exercise session.  Pre-exercise vitals: . Weight kg: 79.5 . Liters of O2: ra . SpO2: 93 . HR: 89 . BP: 100/64 . CBG: na  Exercise vitals: . Highest heartrate:  101 . Lowest oxygen saturation: 90 . Highest blood pressure: 152/66 . Liters of 02: 2L  Post-exercise vitals: . SpO2: 91 . HR: 95 . BP: 116/64 . Liters of O2: 2L . CBG: na  Dr. Alyson ReedyWesam G. Yacoub, Medical Director Dr. Benjamine MolaVann is immediately available during today's Pulmonary Rehab session for Rachel Perez on 08/14/2015 at 1330 class time

## 2015-08-19 ENCOUNTER — Encounter (HOSPITAL_COMMUNITY)
Admission: RE | Admit: 2015-08-19 | Discharge: 2015-08-19 | Disposition: A | Discharge status: Home / Self Care | Payer: Medicare Other | Source: Ambulatory Visit | Attending: Pulmonary Disease | Admitting: Pulmonary Disease

## 2015-08-19 DIAGNOSIS — J439 Emphysema, unspecified: Secondary | ICD-10-CM | POA: Diagnosis not present

## 2015-08-19 NOTE — Progress Notes (Signed)
I have reviewed a Home Exercise Prescription with Rachel Perez . Rachel Perez is not currently exercising at home.  The patient was advised to walk 2-3 days a week for 30 minutes.  Rachel Perez and I discussed how to progress their exercise prescription.  The patient stated that their goals were to preform ADL's easier and be able to breathe better.  The patient stated that they understand the exercise prescription.  We reviewed exercise guidelines, target heart rate during exercise, oxygen use, weather, home pulse oximeter, endpoints for exercise, and goals.  Patient is encouraged to come to me with any questions. I will continue to follow up with the patient to assist them with progression and safety.

## 2015-08-19 NOTE — Progress Notes (Signed)
Today, Rachel Perez exercised at [facility]. Service time was from 1330 to 1500.  The patient exercised by performing aerobic, strengthening, and stretching exercises. Oxygen saturation, heart rate, blood pressure, rate of perceived exertion, and shortness of breath were all monitored before, during, and after exercise. Rachel Perez presented with no problems at today's exercise session.  The patient did not have an increase in workload intensity during today's exercise session.  Pre-exercise vitals: . Weight kg: 79.1 . Liters of O2: RA . SpO2: 94 . HR: 90 . BP: 122/60 . CBG: NA  Exercise vitals: . Highest heartrate:  96 . Lowest oxygen saturation: 89 . Highest blood pressure: 132/58 . Liters of 02: 2  Post-exercise vitals: . SpO2: 94 . HR: 89 . BP: 94/60 . Liters of O2: 2 . CBG: NA Dr. Alyson ReedyWesam G. Yacoub, Medical Director Dr. Konrad DoloresMerrell is immediately available during today's Pulmonary Rehab session for Rachel Perez on 08/19/2015  at 1330 class time  .

## 2015-08-21 ENCOUNTER — Encounter (HOSPITAL_COMMUNITY)
Admission: RE | Admit: 2015-08-21 | Discharge: 2015-08-21 | Disposition: A | Discharge status: Home / Self Care | Payer: Medicare Other | Source: Ambulatory Visit | Attending: Pulmonary Disease | Admitting: Pulmonary Disease

## 2015-08-21 DIAGNOSIS — J439 Emphysema, unspecified: Secondary | ICD-10-CM | POA: Diagnosis not present

## 2015-08-21 NOTE — Progress Notes (Signed)
Today, Marvia PicklesJo Ann exercised at [facility]. Service time was from 1:30pm to 3:50pm.  The patient exercised by performing aerobic, strengthening, and stretching exercises. Oxygen saturation, heart rate, blood pressure, rate of perceived exertion, and shortness of breath were all monitored before, during, and after exercise. Marvia PicklesJo Ann presented with no problems at today's exercise session. The patient attended education today with Yvone NeuPortia Payne on Anatomy and Physiology.   The patient did have an increase in workload intensity during today's exercise session.  Pre-exercise vitals: . Weight kg: 78.8 . Liters of O2: ra . SpO2: 95 . HR: 78 . BP: 112/62 . CBG: na  Exercise vitals: . Highest heartrate:  98 . Lowest oxygen saturation: 91 . Highest blood pressure: 138/60 . Liters of 02: 2  Post-exercise vitals: . SpO2: 93 . HR: 84 . BP: 110/70 . Liters of O2: 2 . CBG: na  Dr. Alyson ReedyWesam G. Yacoub, Medical Director Dr. Carmell Austriaegaldo is immediately available during today's Pulmonary Rehab session for Dionisio DavidJo Ann D Vialpando on 08/21/15 at 1:30pm class time.

## 2015-08-22 ENCOUNTER — Other Ambulatory Visit: Payer: Self-pay

## 2015-08-22 DIAGNOSIS — J439 Emphysema, unspecified: Secondary | ICD-10-CM

## 2015-08-26 ENCOUNTER — Encounter (HOSPITAL_COMMUNITY)
Admission: RE | Admit: 2015-08-26 | Discharge: 2015-08-26 | Disposition: A | Discharge status: Home / Self Care | Payer: Medicare Other | Source: Ambulatory Visit | Attending: Pulmonary Disease | Admitting: Pulmonary Disease

## 2015-08-26 DIAGNOSIS — J439 Emphysema, unspecified: Secondary | ICD-10-CM | POA: Diagnosis not present

## 2015-08-26 NOTE — Progress Notes (Signed)
Today, Rachel Perez exercised at [facility]. Service time was from 1330 to 1500.  The patient exercised by performing aerobic, strengthening, and stretching exercises. Oxygen saturation, heart rate, blood pressure, rate of perceived exertion, and shortness of breath were all monitored before, during, and after exercise. Rachel Perez presented with no problems at today's exercise session.  The patient did  have an increase in workload intensity during today's exercise session.  Pre-exercise vitals: . Weight kg: 79.1 . Liters of O2: RA . SpO2: 98 . HR: 83 . BP: 110/50 . CBG: NA  Exercise vitals: . Highest heartrate:  90 . Lowest oxygen saturation: 90 . Highest blood pressure: 148/70 . Liters of 02: 2  Post-exercise vitals: . SpO2: 95 . HR: 83 . BP: 100/62 . Liters of O2: 2 . CBG: NA Dr. Alyson ReedyWesam G. Yacoub, Medical Director Dr. Carmell Austriaegaldo is immediately available during today's Pulmonary Rehab session for Dionisio DavidJo Perez D Brazzle on 08/26/2015  at 1330 class time.  Marland Kitchen.  .Marland Kitchen

## 2015-08-28 ENCOUNTER — Encounter (HOSPITAL_COMMUNITY)
Admission: RE | Admit: 2015-08-28 | Discharge: 2015-08-28 | Disposition: A | Discharge status: Home / Self Care | Payer: Medicare Other | Source: Ambulatory Visit | Attending: Pulmonary Disease | Admitting: Pulmonary Disease

## 2015-08-28 DIAGNOSIS — J439 Emphysema, unspecified: Secondary | ICD-10-CM | POA: Diagnosis not present

## 2015-08-28 NOTE — Progress Notes (Addendum)
Today, Rachel Perez exercised at [facility]. Service time was from 1330 to 1530.  The patient exercised by performing aerobic, strengthening, and stretching exercises. Oxygen saturation, heart rate, blood pressure, rate of perceived exertion, and shortness of breath were all monitored before, during, and after exercise. Rachel Perez presented with no problems at today's exercise session. Rachel Perez also attended an education session on advanced directives with Theda BelfastBob Hamilton.  The patient did not have an increase in workload intensity during today's exercise session.  Pre-exercise vitals: . Weight kg: 80.4 . Liters of O2: ra . SpO2: 95 . HR: 86 . BP: 120/56 . CBG: na  Exercise vitals: . Highest heartrate:  104 . Lowest oxygen saturation: 89 . Highest blood pressure: 152/60 . Liters of 02: ra  Post-exercise vitals: . SpO2: 93 . HR: 86 . BP: 112/56 . Liters of O2: ra . CBG: na  Dr. Alyson ReedyWesam G. Yacoub, Medical Director Dr. Carmell Austriaegaldo is immediately available during today's Pulmonary Rehab session for Rachel Perez on 08/28/2015 at 1330 class time.

## 2015-09-02 ENCOUNTER — Encounter (HOSPITAL_COMMUNITY): Payer: Medicare Other

## 2015-09-03 DIAGNOSIS — J438 Other emphysema: Secondary | ICD-10-CM | POA: Diagnosis not present

## 2015-09-04 ENCOUNTER — Encounter (HOSPITAL_COMMUNITY)
Admission: RE | Admit: 2015-09-04 | Discharge: 2015-09-04 | Disposition: A | Discharge status: Home / Self Care | Payer: Medicare Other | Source: Ambulatory Visit | Attending: Pulmonary Disease | Admitting: Pulmonary Disease

## 2015-09-04 DIAGNOSIS — J439 Emphysema, unspecified: Secondary | ICD-10-CM | POA: Diagnosis not present

## 2015-09-04 NOTE — Progress Notes (Signed)
Today, Rachel Perez exercised at [facility]. Service time was from 1:30pm to 3:30pm.  The patient exercised by performing aerobic, strengthening, and stretching exercises. Oxygen saturation, heart rate, blood pressure, rate of perceived exertion, and shortness of breath were all monitored before, during, and after exercise. Rachel Perez presented with no problems at today's exercise session. The patient attended education today with [facility]ane[facility] on Pursed Lip and Diaphragmatic Breathing.  The patient did not have an increase in workload intensity during today's exercise session.  Pre-exercise vitals: . Weight kg: 80.6 . Liters of O2: 2 . SpO2: 95 . HR: 85 . BP: 114/60 . CBG: na  Exercise vitals: . Highest heartrate:  96 . Lowest oxygen saturation: 88 . Highest blood pressure: 120/60 . Liters of 02: 2  Post-exercise vitals: . SpO2: 95 . HR: 82 . BP: 120/60 . Liters of O2: 2 . CBG: na  Dr. Alyson ReedyWesam G. Yacoub, Medical Director Dr. Gonzella Lexhungel is immediately available during today's Pulmonary Rehab session for [name] on 09/04/15 at 1:30pm class time.

## 2015-09-09 ENCOUNTER — Encounter (HOSPITAL_COMMUNITY)
Admission: RE | Admit: 2015-09-09 | Discharge: 2015-09-09 | Disposition: A | Discharge status: Home / Self Care | Payer: Medicare Other | Source: Ambulatory Visit | Attending: Pulmonary Disease | Admitting: Pulmonary Disease

## 2015-09-09 DIAGNOSIS — J439 Emphysema, unspecified: Secondary | ICD-10-CM | POA: Diagnosis not present

## 2015-09-09 NOTE — Progress Notes (Signed)
Today, Rachel Perez exercised at [facility]. Service time was from 1:30pm to 3:00pm.  The patient exercised by performing aerobic, strengthening, and stretching exercises. Oxygen saturation, heart rate, blood pressure, rate of perceived exertion, and shortness of breath were all monitored before, during, and after exercise. Rachel Perez presented with no problems at today's exercise session.  The patient did not have an increase in workload intensity during today's exercise session.  Pre-exercise vitals: . Weight kg: 79.6 . Liters of O2: 2 . SpO2: 94 . HR: 86 . BP: 110/46 . CBG: na  Exercise vitals: . Highest heartrate:  103 . Lowest oxygen saturation: 89 . Highest blood pressure: 130/66 . Liters of 02: 2  Post-exercise vitals: . SpO2: 94 . HR: 93 . BP: 104/60 . Liters of O2: 2 . CBG: na  Dr. Alyson ReedyWesam G. Yacoub, Medical Director Dr. Konrad DoloresMerrell is immediately available during today's Pulmonary Rehab session for Rachel Perez on 09/09/15 at 1:30pm class time

## 2015-09-11 ENCOUNTER — Encounter (HOSPITAL_COMMUNITY)
Admission: RE | Admit: 2015-09-11 | Discharge: 2015-09-11 | Disposition: A | Discharge status: Home / Self Care | Payer: Medicare Other | Source: Ambulatory Visit | Attending: Pulmonary Disease | Admitting: Pulmonary Disease

## 2015-09-11 DIAGNOSIS — J439 Emphysema, unspecified: Secondary | ICD-10-CM | POA: Diagnosis not present

## 2015-09-11 NOTE — Progress Notes (Signed)
Today, Rachel Perez exercised at [facility]. Service time was from 1330 to 1540.  The patient exercised by performing aerobic, strengthening, and stretching exercises. Oxygen saturation, heart rate, blood pressure, rate of perceived exertion, and shortness of breath were all monitored before, during, and after exercise. Rachel Perez presented with no problems at today's exercise session. She attended nutritional class today.  The patient did not have an increase in workload intensity during today's exercise session.  Pre-exercise vitals: . Weight kg: 78.8 . Liters of O2: RA . SpO2: 90 . HR: 87 . BP: 94/54 . CBG: NA  Exercise vitals: . Highest heartrate:  101 . Lowest oxygen saturation: 89 . Highest blood pressure: 126/70 . Liters of 02: 2  Post-exercise vitals: . SpO2: 95 . HR: 92 . BP: 104/64 . Liters of O2: 2 . CBG: NA Dr. Alyson ReedyWesam G. Yacoub, Medical Director Dr. Sharl MaLama is immediately available during today's Pulmonary Rehab session for Dionisio DavidJo Perez D [place] on 09/11/2015  at 1330 class time  .

## 2015-09-16 ENCOUNTER — Encounter (HOSPITAL_COMMUNITY): Payer: Medicare Other

## 2015-09-18 ENCOUNTER — Encounter (HOSPITAL_COMMUNITY)
Admission: RE | Admit: 2015-09-18 | Discharge: 2015-09-18 | Disposition: A | Discharge status: Home / Self Care | Payer: Medicare Other | Source: Ambulatory Visit | Attending: Pulmonary Disease | Admitting: Pulmonary Disease

## 2015-09-18 DIAGNOSIS — J439 Emphysema, unspecified: Secondary | ICD-10-CM | POA: Insufficient documentation

## 2015-09-18 NOTE — Progress Notes (Signed)
Rachel Perez 75 y.o. female Nutrition Note Spoke with pt.  Nutrition Survey reviewed with pt. There are some ways the pt can make her eating habits healthier. Recommendations for healthier food choices discussed. Pt wants to lose wt. Pt has not been actively trying to lose wt. Wt loss tips reviewed. Pt expressed understanding of the information reviewed. No results found for: HGBA1C Wt Readings from Last 3 Encounters:  07/28/15 174 lb 13.2 oz (79.3 kg)  07/17/15 173 lb 3.2 oz (78.563 kg)  06/26/15 173 lb (78.472 kg)    Nutrition Diagnosis ? Food-and nutrition-related knowledge deficit related to lack of exposure to information as related to diagnosis of pulmonary disease ? Overweight related to excessive energy intake as evidenced by a BMI of 26.8  Nutrition Intervention ? Pt's individual nutrition plan and goals reviewed with pt. ? Benefits of adopting healthy eating habits discussed when pt's Rate Your Plate reviewed. ? Pt to attend the Nutrition and Lung Disease class - met ? Continual client-centered nutrition education by RD, as part of interdisciplinary care. Goal(s) 1. Identify food quantities necessary to achieve wt loss of  -2# per week to a goal wt loss of 2.7-10.9 kg (6-24 lb) at graduation from pulmonary rehab. 2. Describe the benefit of including fruits, vegetables, whole grains, and low-fat dairy products in a healthy meal plan. Monitor and Evaluate progress toward nutrition goal with team.   Derek Mound, M.Ed, RD, LDN, CDE 09/18/2015 2:59 PM

## 2015-09-18 NOTE — Progress Notes (Signed)
Today, Rachel Perez exercised at [facility]. Service time was from 1330 to 1515.  The patient exercised by performing aerobic, strengthening, and stretching exercises. Oxygen saturation, heart rate, blood pressure, rate of perceived exertion, and shortness of breath were all monitored before, during, and after exercise. Rachel Perez presented with no problems at today's exercise session. Patient attended the Risk Factor class today.   The patient did not have an increase in workload intensity during today's exercise session.  Pre-exercise vitals: . Weight kg: 79.8 . Liters of O2: 2 . SpO2: 95 . HR: 81 . BP: 108/60 . CBG: na  Exercise vitals: . Highest heartrate:  92 . Lowest oxygen saturation: 93 . Highest blood pressure: 132/70 . Liters of 02: 2  Post-exercise vitals: . SpO2: 94 . HR: 80 . BP: 120/58 . Liters of O2: 2 . CBG: na Dr. Alyson ReedyWesam G. Yacoub, Medical Director Dr. Konrad DoloresMerrell is immediately available during today's Pulmonary Rehab session for Rachel Perez on 09/18/2015  at 1330 class time  .

## 2015-09-23 ENCOUNTER — Encounter (HOSPITAL_COMMUNITY): Payer: Medicare Other

## 2015-09-25 ENCOUNTER — Encounter (HOSPITAL_COMMUNITY)
Admission: RE | Admit: 2015-09-25 | Discharge: 2015-09-25 | Disposition: A | Discharge status: Home / Self Care | Payer: Medicare Other | Source: Ambulatory Visit | Attending: Pulmonary Disease | Admitting: Pulmonary Disease

## 2015-09-25 DIAGNOSIS — J439 Emphysema, unspecified: Secondary | ICD-10-CM | POA: Diagnosis not present

## 2015-09-25 NOTE — Progress Notes (Signed)
Today, Rachel Perez exercised at [facility]. Service time was from 1330 to 1500.  The patient exercised by performing aerobic, strengthening, and stretching exercises. Oxygen saturation, heart rate, blood pressure, rate of perceived exertion, and shortness of breath were all monitored before, during, and after exercise. Rachel Perez presented with no problems at today's exercise session. Patient attended the Stress Management class today.  The patient did not have an increase in workload intensity during today's exercise session.  Pre-exercise vitals: . Weight kg: 80.0 . Liters of O2: 2 . SpO2: 96 . HR: 92 . BP: 100/50 . CBG: na  Exercise vitals: . Highest heartrate:  101 . Lowest oxygen saturation: 89 . Highest blood pressure: 120/66 . Liters of 02: 2  Post-exercise vitals: . SpO2: 95 . HR: 85 . BP: 98/60 . Liters of O2: 2 . CBG: na Dr. Alyson ReedyWesam G. Yacoub, Medical Director Dr. Randol KernElgergawy is immediately available during today's Pulmonary Rehab session for Rachel Perez on 09/25/2015  at 1330 class time.  .Marland Kitchen

## 2015-09-30 ENCOUNTER — Encounter (HOSPITAL_COMMUNITY)
Admission: RE | Admit: 2015-09-30 | Discharge: 2015-09-30 | Disposition: A | Discharge status: Home / Self Care | Payer: Medicare Other | Source: Ambulatory Visit | Attending: Pulmonary Disease | Admitting: Pulmonary Disease

## 2015-09-30 DIAGNOSIS — J439 Emphysema, unspecified: Secondary | ICD-10-CM | POA: Diagnosis not present

## 2015-09-30 NOTE — Progress Notes (Signed)
Today, Rachel Perez exercised at [facility]. Service time was from 1330 to 1500.  The patient exercised by performing aerobic, strengthening, and stretching exercises. Oxygen saturation, heart rate, blood pressure, rate of perceived exertion, and shortness of breath were all monitored before, during, and after exercise. Rachel Perez presented with no problems at today's exercise session.  The patient did not have an increase in workload intensity during today's exercise session.  Pre-exercise vitals: . Weight kg: 79.7 . Liters of O2: RA . SpO2: 89 . HR: 92 . BP: 122/80 . CBG: NA  Exercise vitals: . Highest heartrate:  106 . Lowest oxygen saturation: 91 . Highest blood pressure: 140/76 . Liters of 02: 2  Post-exercise vitals: . SpO2: 94 . HR: 86 . BP: 92/62 . Liters of O2: 2 . CBG: NA [name], Medical Director Dr. Konrad Dolores is immediately available during today's Pulmonary Rehab session for Rachel Perez on 09/30/2015  at 1330 class time  .

## 2015-10-02 ENCOUNTER — Encounter (HOSPITAL_COMMUNITY)
Admission: RE | Admit: 2015-10-02 | Discharge: 2015-10-02 | Disposition: A | Discharge status: Home / Self Care | Payer: Medicare Other | Source: Ambulatory Visit | Attending: Pulmonary Disease | Admitting: Pulmonary Disease

## 2015-10-02 DIAGNOSIS — J439 Emphysema, unspecified: Secondary | ICD-10-CM | POA: Diagnosis not present

## 2015-10-02 NOTE — Progress Notes (Signed)
Today, Rachel Perez exercised at [facility]. Service time was from 1330 to 1525.  The patient exercised by performing aerobic, strengthening, and stretching exercises. Oxygen saturation, heart rate, blood pressure, rate of perceived exertion, and shortness of breath were all monitored before, during, and after exercise. Rachel Perez presented with no problems at today's exercise session. She attended pulmonary medication class today.  The patient did not have an increase in workload intensity during today's exercise session.  Pre-exercise vitals: . Weight kg: 79.8 . Liters of O2: RA . SpO2: 94 . HR: 7 . BP: 110/50 . CBG: NA  Exercise vitals: . Highest heartrate:  104 . Lowest oxygen saturation: 91 . Highest blood pressure: 150/70 . Liters of 02: 2  Post-exercise vitals: . SpO2: 96 . HR: 82 . BP: 120/64 . Liters of O2: 2 . CBG: NA [name], Medical Director Dr. Konrad Dolores is immediately available during today's Pulmonary Rehab session for Evalene Vath Loveall on 10/02/2015  at 1330 class time  .

## 2015-10-04 DIAGNOSIS — J438 Other emphysema: Secondary | ICD-10-CM | POA: Diagnosis not present

## 2015-10-07 ENCOUNTER — Encounter (HOSPITAL_COMMUNITY)
Admission: RE | Admit: 2015-10-07 | Discharge: 2015-10-07 | Disposition: A | Discharge status: Home / Self Care | Payer: Medicare Other | Source: Ambulatory Visit | Attending: Pulmonary Disease | Admitting: Pulmonary Disease

## 2015-10-07 DIAGNOSIS — J439 Emphysema, unspecified: Secondary | ICD-10-CM | POA: Diagnosis not present

## 2015-10-07 NOTE — Progress Notes (Signed)
Today, Rachel Perez exercised at [facility]. Service time was from 1330 to 1500.  The patient exercised by performing aerobic, strengthening, and stretching exercises. Oxygen saturation, heart rate, blood pressure, rate of perceived exertion, and shortness of breath were all monitored before, during, and after exercise. Rachel Perez presented with no problems at today's exercise session.  The patient did not have an increase in workload intensity during today's exercise session.  Pre-exercise vitals: . Weight kg: 80.1 . Liters of O2: 2 . SpO2: 96 . HR: 86 . BP: 132/60 . CBG: NA  Exercise vitals: . Highest heartrate:  102 . Lowest oxygen saturation: 89 . Highest blood pressure: 132/60 . Liters of 02: 2  Post-exercise vitals: . SpO2: 96 . HR: 88 . BP: 108/62 . Liters of O2: 2 . CBG: NA [name], Medical Director Dr. Konrad Dolores is immediately available during today's Pulmonary Rehab session for Ermal Brzozowski Dada on 10/07/2015  at 1330 class time  .

## 2015-10-09 ENCOUNTER — Encounter (HOSPITAL_COMMUNITY)
Admission: RE | Admit: 2015-10-09 | Discharge: 2015-10-09 | Disposition: A | Discharge status: Home / Self Care | Payer: Medicare Other | Source: Ambulatory Visit | Attending: Pulmonary Disease | Admitting: Pulmonary Disease

## 2015-10-09 DIAGNOSIS — J439 Emphysema, unspecified: Secondary | ICD-10-CM | POA: Diagnosis not present

## 2015-10-09 NOTE — Progress Notes (Signed)
Today, Rachel Perez exercised at [facility]. Service time was from 1330 to 1530.  The patient exercised by performing aerobic, strengthening, and stretching exercises. Oxygen saturation, heart rate, blood pressure, rate of perceived exertion, and shortness of breath were all monitored before, during, and after exercise. Rachel Perez presented with no problems at today's exercise session. She attended oxygen safety class today.  The patient did not have an increase in workload intensity during today's exercise session.  Pre-exercise vitals: . Weight kg: 79.7 . Liters of O2: 2 . SpO2: 91 . HR: 78 . BP: 108/56 . CBG: NA  Exercise vitals: . Highest heartrate:  97 . Lowest oxygen saturation: 89 . Highest blood pressure: 120/72 . Liters of 02: 2  Post-exercise vitals: . SpO2: 97 . HR: 80 . BP: 118/70 . Liters of O2: 2 . CBG: NA [name], Medical Director Dr. Malachi Bonds is immediately available during today's Pulmonary Rehab session for Kamarie Veno Finamore on 10/09/2015  at 1330 class time.  Marland Kitchen

## 2015-10-10 ENCOUNTER — Other Ambulatory Visit: Payer: Self-pay | Admitting: Physician Assistant

## 2015-10-14 ENCOUNTER — Encounter (HOSPITAL_COMMUNITY)
Admission: RE | Admit: 2015-10-14 | Discharge: 2015-10-14 | Disposition: A | Discharge status: Home / Self Care | Payer: Medicare Other | Source: Ambulatory Visit | Attending: Pulmonary Disease | Admitting: Pulmonary Disease

## 2015-10-14 DIAGNOSIS — J439 Emphysema, unspecified: Secondary | ICD-10-CM | POA: Diagnosis not present

## 2015-10-14 NOTE — Progress Notes (Signed)
Today, Rachel Perez exercised at [facility]. Service time was from 1330 to 1456.  The patient exercised by performing aerobic, strengthening, and stretching exercises. Oxygen saturation, heart rate, blood pressure, rate of perceived exertion, and shortness of breath were all monitored before, during, and after exercise. Rachel Perez presented with no problems at today's exercise session.  The patient did not have an increase in workload intensity during today's exercise session.  Pre-exercise vitals: . Weight kg: 79.7 . Liters of O2: 2 . SpO2: 94 . HR: 88 . BP: 100/40 . CBG: NA  Exercise vitals: . Highest heartrate:  102 . Lowest oxygen saturation: 90 . Highest blood pressure: 138/58 . Liters of 02: 2  Post-exercise vitals: . SpO2: 93 . HR: 91 . BP: 100/50 . Liters of O2: 2 . CBG: NA [name], Medical Director Dr. Konrad Dolores is immediately available during today's Pulmonary Rehab session for Dawnmarie Breon Norbeck on 10/14/2015  at 1330 class time  .

## 2015-10-16 ENCOUNTER — Encounter (HOSPITAL_COMMUNITY)
Admission: RE | Admit: 2015-10-16 | Discharge: 2015-10-16 | Disposition: A | Discharge status: Home / Self Care | Payer: Medicare Other | Source: Ambulatory Visit | Attending: Pulmonary Disease | Admitting: Pulmonary Disease

## 2015-10-16 DIAGNOSIS — J439 Emphysema, unspecified: Secondary | ICD-10-CM | POA: Diagnosis not present

## 2015-10-16 NOTE — Progress Notes (Signed)
Today, Rachel Perez exercised at [facility]. Service time was from 1330 to 1515.  The patient exercised by performing aerobic, strengthening, and stretching exercises. Oxygen saturation, heart rate, blood pressure, rate of perceived exertion, and shortness of breath were all monitored before, during, and after exercise. Rachel Perez presented with no problems at today's exercise session.  Patient attended the Purse lip Breathing and Diaphragmatic Breathing class today.  The patient did not have an increase in workload intensity during today's exercise session.  Pre-exercise vitals: . Weight kg: 79.4 . Liters of O2: 2 . SpO2: 95 . HR: 84 . BP: 110/60 . CBG: na  Exercise vitals: . Highest heartrate:  97 . Lowest oxygen saturation: 91 . Highest blood pressure: 140/70 . Liters of 02: 2  Post-exercise vitals: . SpO2: 98 . HR: 82 . BP: 114/60 . Liters of O2: 2 . CBG: na [name], Medical Director Dr. Ardyth Harps is immediately available during today's Pulmonary Rehab session for Rachel Perez on 10/16/2015  at 1330 class time.  Marland Kitchen

## 2015-10-21 ENCOUNTER — Encounter (HOSPITAL_COMMUNITY): Payer: Medicare Other

## 2015-10-23 ENCOUNTER — Encounter (HOSPITAL_COMMUNITY)
Admission: RE | Admit: 2015-10-23 | Discharge: 2015-10-23 | Disposition: A | Discharge status: Home / Self Care | Payer: Medicare Other | Source: Ambulatory Visit | Attending: Pulmonary Disease | Admitting: Pulmonary Disease

## 2015-10-23 DIAGNOSIS — J439 Emphysema, unspecified: Secondary | ICD-10-CM | POA: Diagnosis not present

## 2015-10-23 NOTE — Progress Notes (Signed)
Today, Rachel Perez exercised at [facility]. Service time was from 1330 to 1520.  The patient exercised by performing aerobic, strengthening, and stretching exercises. Oxygen saturation, heart rate, blood pressure, rate of perceived exertion, and shortness of breath were all monitored before, during, and after exercise. Rachel Perez presented with no problems at today's exercise session. She attended [employer] oxygen education class today.  The patient did not have an increase in workload intensity during today's exercise session.  Pre-exercise vitals: . Weight kg: 79.8 . Liters of O2: 2 . SpO2: 93 . HR: 78 . BP: 110/60 . CBG: NA  Exercise vitals: . Highest heartrate:  105 . Lowest oxygen saturation: 92 . Highest blood pressure: 126/74 . Liters of 02: 2  Post-exercise vitals: . SpO2: 94 . HR: 88 . BP: 124/62 . Liters of O2: 2 . CBG: NA [name], Medical Director Dr. Randol Kern is immediately available during today's Pulmonary Rehab session for Rachel Perez on 10/23/2015  at 1330 class time.  Marland Kitchen

## 2015-10-28 ENCOUNTER — Encounter (HOSPITAL_COMMUNITY)
Admission: RE | Admit: 2015-10-28 | Discharge: 2015-10-28 | Disposition: A | Discharge status: Home / Self Care | Payer: Medicare Other | Source: Ambulatory Visit | Attending: Pulmonary Disease | Admitting: Pulmonary Disease

## 2015-10-28 DIAGNOSIS — J439 Emphysema, unspecified: Secondary | ICD-10-CM | POA: Diagnosis not present

## 2015-10-28 NOTE — Progress Notes (Signed)
Today, Rachel Perez exercised at [facility]. Service time was from 1330 to 1510.  The patient exercised by performing aerobic, strengthening, and stretching exercises. Oxygen saturation, heart rate, blood pressure, rate of perceived exertion, and shortness of breath were all monitored before, during, and after exercise. Rachel Perez presented with no problems at today's exercise session.  The patient did  have an increase in workload intensity during today's exercise session.  Pre-exercise vitals: . Weight kg: 80.5 . Liters of O2: 2 . SpO2: 95 . HR: 82 . BP: 108/70 . CBG: na .   Exercise vitals: . Highest heartrate:  96 . Lowest oxygen saturation: 91 . Highest blood pressure: 104/60 . Liters of 02: 2  Post-exercise vitals: . SpO2: 96 . HR: 82 . BP: 122/68 . Liters of O2: 2 . CBG: na [name], Medical Director Dr. Konrad Dolores is immediately available during today's Pulmonary Rehab session for [name] on 10/28/2015  at 1330 class time  .

## 2015-10-30 ENCOUNTER — Encounter (HOSPITAL_COMMUNITY)
Admission: RE | Admit: 2015-10-30 | Discharge: 2015-10-30 | Disposition: A | Discharge status: Home / Self Care | Payer: Medicare Other | Source: Ambulatory Visit | Attending: Pulmonary Disease | Admitting: Pulmonary Disease

## 2015-10-30 DIAGNOSIS — J439 Emphysema, unspecified: Secondary | ICD-10-CM | POA: Diagnosis not present

## 2015-10-30 NOTE — Progress Notes (Signed)
Today, Rachel Perez exercised at [facility]. Service time was from 1330 to 1515.  The patient exercised by performing aerobic, strengthening, and stretching exercises. Oxygen saturation, heart rate, blood pressure, rate of perceived exertion, and shortness of breath were all monitored before, during, and after exercise. Rachel Perez presented with no problems at today's exercise session. She attended warning signs and symptoms class today.  The patient did not have an increase in workload intensity during today's exercise session.  Pre-exercise vitals: . Weight kg: 81.1 . Liters of O2: 2 . SpO2: 95 . HR: 81 . BP: 140/76 . CBG: NA  Exercise vitals: . Highest heartrate:  97 . Lowest oxygen saturation: 91 . Highest blood pressure: 156/64 . Liters of 02: 2  Post-exercise vitals: . SpO2: 96 . HR: 85 . BP: 112/62 . Liters of O2: 2 . CBG: NA [name], Medical Director Dr. Benjamine Mola is immediately available during today's Pulmonary Rehab session for Rachel Perez on 10/30/2015  at 1330 class time  .

## 2015-11-04 ENCOUNTER — Encounter (HOSPITAL_COMMUNITY)
Admission: RE | Admit: 2015-11-04 | Discharge: 2015-11-04 | Disposition: A | Discharge status: Home / Self Care | Payer: Medicare Other | Source: Ambulatory Visit | Attending: Pulmonary Disease | Admitting: Pulmonary Disease

## 2015-11-04 DIAGNOSIS — J439 Emphysema, unspecified: Secondary | ICD-10-CM | POA: Diagnosis not present

## 2015-11-04 DIAGNOSIS — J438 Other emphysema: Secondary | ICD-10-CM | POA: Diagnosis not present

## 2015-11-04 NOTE — Progress Notes (Signed)
Rachel Perez completed a Six-Minute Walk Test on 11/04/15 . Rachel Perez walked 1131 feet with no rest breaks.  The patient's lowest oxygen saturation was 91 %, highest heart rate was 100 bpm , and highest blood pressure was 148/72. The patient was on 2 liters of oxygen with a nasal cannula. Patient stated that nothing hindered their walk test.  She improved by 331 ft and was quite proud of her accomplishment. Fabio Pierce, MA, ACSM RCEP

## 2015-11-06 ENCOUNTER — Encounter (HOSPITAL_COMMUNITY): Payer: Medicare Other

## 2015-11-09 ENCOUNTER — Other Ambulatory Visit: Payer: Self-pay | Admitting: Physician Assistant

## 2015-11-11 ENCOUNTER — Encounter (HOSPITAL_COMMUNITY): Payer: Medicare Other

## 2015-11-13 ENCOUNTER — Encounter (HOSPITAL_COMMUNITY): Payer: Medicare Other

## 2015-11-15 NOTE — Progress Notes (Signed)
Pulmonary Rehab Discharge Note  Gillian ScarceJo Ann Zambrana graduated from pulmonary rehab as of 11/04/2015.  She was a pleasure to have in the program.  Her attendance was regular and she made great progress.  She increased her 6 minute walk test distance by 331 feet and improved her quality of like by 11%.  She did meet her program goals which were to feel more confident in using her flutter valve so as to improve clearing of her lung secretions.  She also increased her strength and stamina, and decreased her shortness of breath which were her other program goals.  She is now exercising 5 days per week and will [facility] it is heated and air conditioned.  She is considering joining our pulmonary rehab maintenance program if exercising on her own fails.

## 2015-11-17 ENCOUNTER — Ambulatory Visit (INDEPENDENT_AMBULATORY_CARE_PROVIDER_SITE_OTHER): Payer: Medicare Other | Admitting: Pulmonary Disease

## 2015-11-17 ENCOUNTER — Encounter: Payer: Self-pay | Admitting: Pulmonary Disease

## 2015-11-17 VITALS — BP 124/62 | HR 82 | Ht 67.0 in | Wt 175.0 lb

## 2015-11-17 DIAGNOSIS — J439 Emphysema, unspecified: Secondary | ICD-10-CM

## 2015-11-17 NOTE — Assessment & Plan Note (Signed)
This has been a stable interval for Rachel Perez.  She is doing well with exercise, Symbicort and Spiriva.  She was encouraged to exercise a lot.  Plan: Continue exercise Continue symbicort and spiriva F/u 6 months

## 2015-11-17 NOTE — Patient Instructions (Signed)
Continue taking your medicines as you are doing EXERCISE! We will see you back in 6 months or sooner if needed

## 2015-11-17 NOTE — Progress Notes (Signed)
Subjective:    Patient ID: Rachel Perez, female    DOB: 1940-11-24, 75 y.o.   MRN: [id number]  Synopsis: Former patient of Dr. Shelle Ironlance who has COPD. PFT's 11/2010:  FEV1 0.76, ratio 32, 47% improvement with BD, no restriction, +airtrapping, DLCO 53%.  November 2016 pulmonary function testing ratio 31%, FEV1 0.75 L (29% predicted, 12% change but not significant), total lung capacity 10.56 L (192% predicted, residual volume 8.54 L (358% predicted), DLCO 8.62 (30% predicted)  HPI Chief Complaint  Patient presents with  . Follow-up    pt states her breathing is stable-recently finished pulm rehab.     Rachel Perez is here to see me today for her COPD.  She is friends with Mr. Milas GainMitchel who is my patient in the ICU today. She has been working really hard in pulmonary rehab and she made friends with her classmates from rehab.  She wants to go pulmonary rehab. She is using her portable oxygen device well. [place].   [facility] or signing up for a maintenance program.   She is still taking the Symbicort and Spiriva.  Past Medical History  Diagnosis Date  . COPD (chronic obstructive pulmonary disease) (HCC)   . H/O bladder infections   . Anxiety   . Arthritis   . Depression   . Osteoporosis   . Thyroid disease       Review of Systems  Constitutional: Positive for fatigue. Negative for fever and chills.  HENT: Negative for postnasal drip, rhinorrhea and sinus pressure.   Respiratory: Positive for shortness of breath. Negative for cough and wheezing.   Cardiovascular: Negative for chest pain, palpitations and leg swelling.       Objective:   Physical Exam Filed Vitals:   11/17/15 1335  BP: 124/62  Pulse: 82  Height: 5\' 7"  (1.702 m)  Weight: 175 lb (79.379 kg)  SpO2: 94%  RA  Gen: well appearing HENT: OP clear, TM's clear, neck supple PULM: Wheezing, mild, good air movement CV: RRR, no mgr, trace  edema GI: BS+, soft, nontender Derm: no cyanosis or rash Psyche: normal mood and affect     Assessment & Plan:  COPD (chronic obstructive pulmonary disease) with emphysema (HCC) This has been a stable interval for Rachel Perez.  She is doing well with exercise, Symbicort and Spiriva.  She was encouraged to exercise a lot.  Plan: Continue exercise Continue symbicort and spiriva F/u 6 months    Current outpatient prescriptions:  .  alendronate (FOSAMAX) 70 MG tablet, Take 1 tablet (70 mg total) by mouth every 7 (seven) days. Take with a full glass of water on an empty stomach., Disp: 12 tablet, Rfl: 2 .  ALPRAZolam (XANAX) 0.5 MG tablet, Take 1 tablet (0.5 mg total) by mouth at bedtime as needed., Disp: 30 tablet, Rfl: 0 .  Calcium Carbonate (CALCIUM 500 PO), Take 500 Units by mouth 3 (three) times daily., Disp: , Rfl:  .  Cholecalciferol (VITAMIN D) 2000 UNITS tablet, Take 6,000 Units by mouth daily. , Disp: , Rfl:  .  ipratropium-albuterol (DUONEB) 0.5-2.5 (3) MG/3ML SOLN, 1 vial tid, Disp: 270 mL, Rfl: 11 .  PROAIR HFA 108 (90 BASE) MCG/ACT inhaler, Inhale 1-2 puffs into the lungs every 6 (six) hours as needed for wheezing or shortness of breath., Disp: 18 g, Rfl: 11 .  Respiratory Therapy Supplies (FLUTTER) DEVI, Use as directed, Disp: 1  each, Rfl: 0 .  Spacer/Aero-Holding Chambers (AEROCHAMBER MV) inhaler, Use as instructed, Disp: 1 each, Rfl: 0 .  SPIRIVA HANDIHALER 18 MCG inhalation capsule, INHALE CONTENTS OF 1 CAPSULE VIA HANDIHALER AT THE SAME TIME EVERY DAY, Disp: 90 capsule, Rfl: 0 .  SYMBICORT 160-4.5 MCG/ACT inhaler, INHALE 2 PUFFS INTO THE LUNGS TWICE A DAY, Disp: 10.2 Inhaler, Rfl: 0

## 2015-11-18 ENCOUNTER — Encounter (HOSPITAL_COMMUNITY): Payer: Medicare Other

## 2015-11-20 ENCOUNTER — Encounter (HOSPITAL_COMMUNITY): Payer: Medicare Other

## 2015-11-25 ENCOUNTER — Encounter (HOSPITAL_COMMUNITY): Payer: Medicare Other

## 2015-11-27 ENCOUNTER — Encounter (HOSPITAL_COMMUNITY): Payer: Medicare Other

## 2015-12-02 ENCOUNTER — Encounter (HOSPITAL_COMMUNITY): Payer: Medicare Other

## 2015-12-02 DIAGNOSIS — J438 Other emphysema: Secondary | ICD-10-CM | POA: Diagnosis not present

## 2015-12-09 DIAGNOSIS — T148 Other injury of unspecified body region: Secondary | ICD-10-CM | POA: Diagnosis not present

## 2015-12-09 DIAGNOSIS — N3001 Acute cystitis with hematuria: Secondary | ICD-10-CM | POA: Diagnosis not present

## 2015-12-09 DIAGNOSIS — W5503XA Scratched by cat, initial encounter: Secondary | ICD-10-CM | POA: Diagnosis not present

## 2015-12-12 ENCOUNTER — Encounter: Payer: Self-pay | Admitting: Pulmonary Disease

## 2015-12-12 ENCOUNTER — Ambulatory Visit (INDEPENDENT_AMBULATORY_CARE_PROVIDER_SITE_OTHER): Payer: Medicare Other | Admitting: Pulmonary Disease

## 2015-12-12 ENCOUNTER — Other Ambulatory Visit: Payer: Self-pay | Admitting: Physician Assistant

## 2015-12-12 VITALS — BP 144/60 | HR 101 | Temp 98.2°F | Ht 67.0 in | Wt 174.0 lb

## 2015-12-12 DIAGNOSIS — J439 Emphysema, unspecified: Secondary | ICD-10-CM | POA: Diagnosis not present

## 2015-12-12 MED ORDER — CEFUROXIME AXETIL 250 MG PO TABS: 250.0000 mg | ORAL_TABLET | Freq: Two times a day (BID) | ORAL | Status: DC

## 2015-12-12 MED ORDER — PREDNISONE 10 MG PO TABS: ORAL_TABLET | ORAL | Status: DC

## 2015-12-12 NOTE — Assessment & Plan Note (Signed)
Unfortunately she is having a mild exacerbation of her severe COPD. She is wheezing on exam today and she has had increased mucus production and a change in mucus color recently. There are no features of this that strongly suggest pneumonia, she likely had this start with a viral illness.  Plan: Use DuoNeb 4 times a day for the next few days Continue current maintenance inhaled therapy Prednisone taper Ceftin 7 days Follow-up with our nurse practitioner in about 3-4 weeks Keep using oxygen as prescribed

## 2015-12-12 NOTE — Patient Instructions (Signed)
Use your DuoNeb 4 times a day for the next 4 days Keep taking your other inhaled medicines as you're doing Keep using the oxygen when you exerts yourself Take the prednisone taper as prescribed Take the Ceftin as prescribed I would like for you to follow-up with our nurse practitioner in about 3-4 weeks to make sure things are going okay

## 2015-12-12 NOTE — Progress Notes (Signed)
Subjective:    Patient ID: Rachel Perez, female    DOB: 1941-07-06, 75 y.o.   MRN: [id number]  Synopsis: Former patient of Dr. Shelle Iron who has COPD. PFT's 11/2010:  FEV1 0.76, ratio 32, 47% improvement with BD, no restriction, +airtrapping, DLCO 53%.  November 2016 pulmonary function testing ratio 31%, FEV1 0.75 L (29% predicted, 12% change but not significant), total lung capacity 10.56 L (192% predicted, residual volume 8.54 L (358% predicted), DLCO 8.62 (30% predicted)  HPI Chief Complaint  Patient presents with  . Acute Visit    Pt c/o increase in SOB, prod cough with little mucus production -  yellow mucus, occassional wheezing x 1.5-2weeks. [facility] and was [facility] she has [facility] U[facility] and was given Bacrtim DS and just finished the abx today.    Rachel Perez is here to see me today for her COPD.     She feels like she is weaker and more having difficulty breathing than her previous baseline for the past week. She is losing O2 saturation to below 85% when walking a few yards.  She is on 2L of O2 now.  She describes minimal difficulty breathing at rest and breathing with pursed lips.  She is easily stressed and starts losing O2 sat and with an elevated heart rate  by activities such as trying to decide what to wear in the morning and .    She was feeling fine at last visit and after rehab had finished and was going to try walking 5-6 times a week. Two friends recently passed, so did not try walking until 18th (~2 weeks later).  She has not been successful in walking due to symptoms.  Coughing up light-yellow sputum and has had rhiorrhea since Monday and takes mucinex daily and takes OTC zyrtec for her seasonal allergies.     No recent respiratory illnesses. [facility], but cultures were negative.  She completed her course of bactram.     She has a ~40 pack year smoking history but no longer smokes.        Past Medical History    Diagnosis Date  . COPD (chronic obstructive pulmonary disease) (HCC)   . H/O bladder infections   . Anxiety   . Arthritis   . Depression   . Osteoporosis   . Thyroid disease       Review of Systems  Constitutional: Positive for fatigue. Negative for fever and chills.  HENT: Negative for postnasal drip, rhinorrhea and sinus pressure.   Respiratory: Positive for cough and shortness of breath. Negative for wheezing.   Cardiovascular: Negative for chest pain, palpitations and leg swelling.       Objective:   Physical Exam Filed Vitals:   12/12/15 1016  BP: 144/60  Pulse: 101  Temp: 98.2 F (36.8 C)  TempSrc: Oral  Height:  (1.702 m)  Weight: 174 lb (78.926 kg)  SpO2: 92%  RA  Gen: well appearing HENT: OP clear, TM's clear, neck supple PULM: Wheezing, mild, good air movement CV: RRR, no mgr, trace edema GI: BS+, soft, nontender Derm: no cyanosis or rash Psyche: normal mood and affect  CBC    Component Value Date/Time   WBC 6.4 01/29/2015 1354   RBC 4.65 01/29/2015 1354   HGB 14.3 01/29/2015 1354   HCT 41.3 01/29/2015 1354   PLT 261 01/29/2015 1354   MCV 88.8 01/29/2015 1354   MCH  30.8 01/29/2015 1354   MCHC 34.6 01/29/2015 1354   RDW 13.5 01/29/2015 1354   LYMPHSABS 1.3 01/29/2015 1354   MONOABS 0.4 01/29/2015 1354   EOSABS 0.1 01/29/2015 1354   BASOSABS 0.1 01/29/2015 1354         Assessment & Plan:  COPD (chronic obstructive pulmonary disease) with emphysema (HCC) Unfortunately she is having a mild exacerbation of her severe COPD. She is wheezing on exam today and she has had increased mucus production and a change in mucus color recently. There are no features of this that strongly suggest pneumonia, she likely had this start with a viral illness.  Plan: Use DuoNeb 4 times a day for the next few days Continue current maintenance inhaled therapy Prednisone taper Ceftin 7 days Follow-up with our nurse practitioner in about 3-4 weeks Keep  using oxygen as prescribed    Current outpatient prescriptions:  .  alendronate (FOSAMAX) 70 MG tablet, Take 1 tablet (70 mg total) by mouth every 7 (seven) days. Take with a full glass of water on an empty stomach., Disp: 12 tablet, Rfl: 2 .  ALPRAZolam (XANAX) 0.5 MG tablet, Take 1 tablet (0.5 mg total) by mouth at bedtime as needed., Disp: 30 tablet, Rfl: 0 .  Calcium Carbonate (CALCIUM 500 PO), Take 500 Units by mouth 3 (three) times daily., Disp: , Rfl:  .  Cholecalciferol (VITAMIN D) 2000 UNITS tablet, Take 6,000 Units by mouth daily. , Disp: , Rfl:  .  ipratropium-albuterol (DUONEB) 0.5-2.5 (3) MG/3ML SOLN, 1 vial tid, Disp: 270 mL, Rfl: 11 .  PROAIR HFA 108 (90 BASE) MCG/ACT inhaler, Inhale 1-2 puffs into the lungs every 6 (six) hours as needed for wheezing or shortness of breath., Disp: 18 g, Rfl: 11 .  Respiratory Therapy Supplies (FLUTTER) DEVI, Use as directed, Disp: 1 each, Rfl: 0 .  Spacer/Aero-Holding Chambers (AEROCHAMBER MV) inhaler, Use as instructed, Disp: 1 each, Rfl: 0 .  SPIRIVA HANDIHALER 18 MCG inhalation capsule, INHALE CONTENTS OF 1 CAPSULE VIA HANDIHALER AT THE SAME TIME EVERY DAY, Disp: 90 capsule, Rfl: 0 .  SYMBICORT 160-4.5 MCG/ACT inhaler, INHALE 2 PUFFS INTO THE LUNGS TWICE A DAY, Disp: 10.2 Inhaler, Rfl: 0 .  cefUROXime (CEFTIN) 250 MG tablet, Take 1 tablet (250 mg total) by mouth 2 (two) times daily with a meal., Disp: 14 tablet, Rfl: 0 .  predniSONE (DELTASONE) 10 MG tablet, Take 40mg  po daily for 3 days, then take 30mg  po daily for 3 days, then take 20mg  po daily for two days, then take 10mg  po daily for 2 days, Disp: 27 tablet, Rfl: 0

## 2016-01-02 ENCOUNTER — Encounter: Payer: Self-pay | Admitting: Adult Health

## 2016-01-02 ENCOUNTER — Ambulatory Visit (INDEPENDENT_AMBULATORY_CARE_PROVIDER_SITE_OTHER): Payer: Medicare Other | Admitting: Adult Health

## 2016-01-02 VITALS — BP 118/64 | HR 85 | Temp 98.0°F | Ht 67.0 in | Wt 176.0 lb

## 2016-01-02 DIAGNOSIS — J439 Emphysema, unspecified: Secondary | ICD-10-CM

## 2016-01-02 DIAGNOSIS — J438 Other emphysema: Secondary | ICD-10-CM | POA: Diagnosis not present

## 2016-01-02 NOTE — Patient Instructions (Signed)
Continue on Symbicort and Spiriva  Continue on Oxygen 2l/m with activity .  Follow up with Dr. McQuaid 6 months and As needed   

## 2016-01-02 NOTE — Assessment & Plan Note (Signed)
Continue on Symbicort and Spiriva  Continue on Oxygen 2l/m with activity .  Follow up with Dr. Kendrick FriesMcQuaid 6 months and As needed

## 2016-01-02 NOTE — Progress Notes (Signed)
Subjective:    Patient ID: Rachel Perez Ann D Skipper, female    DOB: 1941/05/25, 75 y.o.   MRN: [id number]  HPI  75 year old female former smoker with severe COPD, GOLD IV on o2 with act .   TEST  PFT's 11/2010: FEV1 0.76, ratio 32, 47% improvement with BD, no restriction, +airtrapping, DLCO 53%.  November 2016 pulmonary function testing ratio 31%, FEV1 0.75 L (29% predicted, 12% change but not significant), total lung capacity 10.56 L (192% predicted, residual volume 8.54 L (358% predicted), DLCO 8.62 (30% predicted)   01/02/2016  Pt returns for a 3 week follow up . Had COPD flare last ov , tx w/ ceftin and prednisone taper along with duoneb. Feels it really helped her a lot. Doing much better . Still gets winded with walking but better than before . Denies chest pain, orthopnea, edema or fever.  Remains on spiriva and symbicort. Marland Kitchen.  PVX and Prevnar utd  CXR 06/2015 COPD changes    Past Medical History  Diagnosis Date  . COPD (chronic obstructive pulmonary disease) (HCC)   . H/O bladder infections   . Anxiety   . Arthritis   . Depression   . Osteoporosis   . Thyroid disease    Current Outpatient Prescriptions on File Prior to Visit  Medication Sig Dispense Refill  . alendronate (FOSAMAX) 70 MG tablet Take 1 tablet (70 mg total) by mouth every 7 (seven) days. Take with a full glass of water on an empty stomach. 12 tablet 2  . ALPRAZolam (XANAX) 0.5 MG tablet Take 1 tablet (0.5 mg total) by mouth at bedtime as needed. 30 tablet 0  . Calcium Carbonate (CALCIUM 500 PO) Take 500 Units by mouth 3 (three) times daily.    . Cholecalciferol (VITAMIN D) 2000 UNITS tablet Take 6,000 Units by mouth daily.     Marland Kitchen. ipratropium-albuterol (DUONEB) 0.5-2.5 (3) MG/3ML SOLN 1 vial tid 270 mL 11  . PROAIR HFA 108 (90 BASE) MCG/ACT inhaler Inhale 1-2 puffs into the lungs every 6 (six) hours as needed for wheezing or shortness of breath. 18 g 11  . Respiratory Therapy Supplies (FLUTTER) DEVI Use as directed  1 each 0  . Spacer/Aero-Holding Chambers (AEROCHAMBER MV) inhaler Use as instructed 1 each 0  . SPIRIVA HANDIHALER 18 MCG inhalation capsule INHALE CONTENTS OF 1 CAPSULE VIA HANDIHALER AT THE SAME TIME EVERY DAY 90 capsule 0  . SYMBICORT 160-4.5 MCG/ACT inhaler INHALE 2 PUFFS INTO THE LUNGS TWICE A DAY 10.2 Inhaler 0   No current facility-administered medications on file prior to visit.     Review of Systems Constitutional:   No  weight loss, night sweats,  Fevers, chills, fatigue, or  lassitude.  HEENT:   No headaches,  Difficulty swallowing,  Tooth/dental problems, or  Sore throat,                No sneezing, itching, ear ache, nasal congestion, post nasal drip,   CV:  No chest pain,  Orthopnea, PND, swelling in lower extremities, anasarca, dizziness, palpitations, syncope.   GI  No heartburn, indigestion, abdominal pain, nausea, vomiting, diarrhea, change in bowel habits, loss of appetite, bloody stools.   Resp:    No chest wall deformity  Skin: no rash or lesions.  GU: no dysuria, change in color of urine, no urgency or frequency.  No flank pain, no hematuria   MS:  No joint pain or swelling.  No decreased range of motion.  No back pain.  Psych:  No change in mood or affect. No depression or anxiety.  No memory loss.         Objective:   Physical Exam Filed Vitals:   01/02/16 1454  BP: 118/64  Pulse: 85  Temp: 98 F (36.7 C)  TempSrc: Oral  Height:  (1.702 m)  Weight: 176 lb (79.833 kg)  SpO2: 93%     GEN: A/Ox3; pleasant , NAD, ederly   HEENT:  Geiger/AT,  EACs-clear, TMs-wnl, NOSE-clear, THROAT-clear, no lesions, no postnasal drip or exudate noted.   NECK:  Supple w/ fair ROM; no JVD; normal carotid impulses w/o bruits; no thyromegaly or nodules palpated; no lymphadenopathy.  RESP  Clear  P & A; w/o, wheezes/ rales/ or rhonchi.no accessory muscle use, no dullness to percussion  CARD:  RRR, no m/r/g  , no peripheral edema, pulses intact, no cyanosis or  clubbing.  GI:   Soft & nt; nml bowel sounds; no organomegaly or masses detected.  Musco: Warm bil, no deformities or joint swelling noted.   Neuro: alert, no focal deficits noted.    Skin: Warm, no lesions or rashes  Ellin Fitzgibbons NP-C  [facility]  01/02/2016        Assessment & Plan:

## 2016-01-08 ENCOUNTER — Other Ambulatory Visit: Payer: Self-pay | Admitting: Physician Assistant

## 2016-01-14 ENCOUNTER — Other Ambulatory Visit: Payer: Self-pay | Admitting: Physician Assistant

## 2016-01-27 ENCOUNTER — Ambulatory Visit (INDEPENDENT_AMBULATORY_CARE_PROVIDER_SITE_OTHER): Payer: Medicare Other | Admitting: Physician Assistant

## 2016-01-27 ENCOUNTER — Encounter: Payer: Self-pay | Admitting: Physician Assistant

## 2016-01-27 ENCOUNTER — Telehealth: Payer: Self-pay | Admitting: Pulmonary Disease

## 2016-01-27 VITALS — BP 112/66 | HR 92 | Temp 98.2°F | Resp 16 | Ht 67.0 in | Wt 172.0 lb

## 2016-01-27 DIAGNOSIS — R5383 Other fatigue: Secondary | ICD-10-CM | POA: Diagnosis not present

## 2016-01-27 DIAGNOSIS — E559 Vitamin D deficiency, unspecified: Secondary | ICD-10-CM | POA: Diagnosis not present

## 2016-01-27 DIAGNOSIS — J439 Emphysema, unspecified: Secondary | ICD-10-CM | POA: Diagnosis not present

## 2016-01-27 DIAGNOSIS — F411 Generalized anxiety disorder: Secondary | ICD-10-CM

## 2016-01-27 DIAGNOSIS — M810 Age-related osteoporosis without current pathological fracture: Secondary | ICD-10-CM

## 2016-01-27 DIAGNOSIS — Z131 Encounter for screening for diabetes mellitus: Secondary | ICD-10-CM

## 2016-01-27 DIAGNOSIS — Z8619 Personal history of other infectious and parasitic diseases: Secondary | ICD-10-CM

## 2016-01-27 DIAGNOSIS — Z8679 Personal history of other diseases of the circulatory system: Secondary | ICD-10-CM | POA: Insufficient documentation

## 2016-01-27 DIAGNOSIS — Z0000 Encounter for general adult medical examination without abnormal findings: Secondary | ICD-10-CM | POA: Diagnosis not present

## 2016-01-27 LAB — CBC WITH DIFFERENTIAL/PLATELET
BASOS PCT: 1 %
Basophils Absolute: 84 cells/uL (ref 0–200)
EOS ABS: 252 {cells}/uL (ref 15–500)
Eosinophils Relative: 3 %
HEMATOCRIT: 42.6 % (ref 35.0–45.0)
Hemoglobin: 14.1 g/dL (ref 11.7–15.5)
LYMPHS PCT: 17 %
Lymphs Abs: 1428 cells/uL (ref 850–3900)
MCH: 30.3 pg (ref 27.0–33.0)
MCHC: 33.1 g/dL (ref 32.0–36.0)
MCV: 91.4 fL (ref 80.0–100.0)
MPV: 12 fL (ref 7.5–12.5)
Monocytes Absolute: 756 cells/uL (ref 200–950)
Monocytes Relative: 9 %
NEUTROS ABS: 5880 {cells}/uL (ref 1500–7800)
Neutrophils Relative %: 70 %
Platelets: 268 10*3/uL (ref 140–400)
RBC: 4.66 MIL/uL (ref 3.80–5.10)
RDW: 14.3 % (ref 11.0–15.0)
WBC: 8.4 10*3/uL (ref 3.8–10.8)

## 2016-01-27 MED ORDER — BUDESONIDE-FORMOTEROL FUMARATE 160-4.5 MCG/ACT IN AERO: INHALATION_SPRAY | RESPIRATORY_TRACT | Status: DC

## 2016-01-27 MED ORDER — TIOTROPIUM BROMIDE MONOHYDRATE 18 MCG IN CAPS: 18.0000 ug | ORAL_CAPSULE | Freq: Every day | RESPIRATORY_TRACT | Status: DC

## 2016-01-27 MED ORDER — ALPRAZOLAM 0.5 MG PO TABS: 0.5000 mg | ORAL_TABLET | Freq: Every evening | ORAL | Status: DC | PRN

## 2016-01-27 MED ORDER — PROAIR HFA 108 (90 BASE) MCG/ACT IN AERS: 1.0000 | INHALATION_SPRAY | Freq: Four times a day (QID) | RESPIRATORY_TRACT | Status: DC | PRN

## 2016-01-27 MED ORDER — IPRATROPIUM-ALBUTEROL 0.5-2.5 (3) MG/3ML IN SOLN: RESPIRATORY_TRACT | Status: DC

## 2016-01-27 MED ORDER — ALENDRONATE SODIUM 70 MG PO TABS: 70.0000 mg | ORAL_TABLET | ORAL | Status: DC

## 2016-01-27 NOTE — Telephone Encounter (Signed)
Will hold in triage until form received.

## 2016-01-27 NOTE — Patient Instructions (Addendum)
We recommend that you schedule a mammogram for breast cancer screening. Typically, you do not need a referral to do this. Please contact a local imaging center to schedule your mammogram.  [facility] - [phone]  *ask for the Radiology Department [facility]) - [phone] or [phone]  [facility] - [phone] [facility] - [phone] [facility] - [phone]  *ask for the Radiology Department [facility] - [phone]  *ask for the Radiology Department [facility] - [phone]  *ask for the Mammography Department [facility] - [phone]     IF you received an x-ray today, you will receive an invoice from [facility]. Please contact [facility] at [phone] with questions or concerns regarding your invoice.   IF you received labwork today, you will receive an invoice from [facility]. Please contact [facility] at [phone] with questions or concerns regarding your invoice.   Our billing staff will not be able to assist you with questions regarding bills from these companies.  You will be contacted with the lab results as soon as they are available. The fastest way to get your results is to activate your My Chart account. Instructions are located on the last page of this paperwork. If you have not heard from us regarding the results in 2 weeks, please contact this office.    Keeping You Healthy  Get These Tests  Blood Pressure- Have your blood pressure checked by your healthcare provider at least once a year.  Normal blood pressure is 120/80.  Weight- Have your body mass index (BMI) calculated to screen for obesity.  BMI is a measure of body fat based on height and weight.  You can calculate your own BMI at www.nhlbisupport.com/bmi/  Cholesterol- Have your cholesterol checked every year.  Diabetes- Have  your blood sugar checked every year if you have high blood pressure, high cholesterol, a family history of diabetes or if you are overweight.  Pap Test - Have a pap test every 1 to 5 years if you have been sexually active.  If you are older than 65 and recent pap tests have been normal you may not need additional pap tests.  In addition, if you have had a hysterectomy  for benign disease additional pap tests are not necessary.  Mammogram-Yearly mammograms are essential for early detection of breast cancer  Screening for Colon Cancer- Colonoscopy starting at age 50. Screening may begin sooner depending on your family history and other health conditions.  Follow up colonoscopy as directed by your Gastroenterologist.  Screening for Osteoporosis- Screening begins at age 65 with bone density scanning, sooner if you are at higher risk for developing Osteoporosis.  Get these medicines  Calcium with Vitamin D- Your body requires 1200-1500 mg of Calcium a day and 800-1000 IU of Vitamin D a day.  You can only absorb 500 mg of Calcium at a time therefore Calcium must be taken in 2 or 3 separate doses throughout the day.  Hormones- Hormone therapy has been associated with increased risk for certain cancers and heart disease.  Talk to your healthcare provider about if you need relief from menopausal symptoms.  Aspirin- Ask your healthcare provider about taking Aspirin to prevent Heart Disease and Stroke.  Get these Immuniztions  Flu shot- Every fall  Pneumonia shot- Once after the age of 65; if you are younger ask your healthcare provider if you need a pneumonia   shot.  Tetanus- Every ten years.  Zostavax- Once after the age of 60 to prevent shingles.  Take these steps  Don't smoke- Your healthcare provider can help you quit. For tips on how to quit, ask your healthcare provider or go to www.smokefree.gov or call 1-800 QUIT-NOW.  Be physically active- Exercise 5 days a week for a minimum of 30  minutes.  If you are not already physically active, start slow and gradually work up to 30 minutes of moderate physical activity.  Try walking, dancing, bike riding, swimming, etc.  Eat a healthy diet- Eat a variety of healthy foods such as fruits, vegetables, whole grains, low fat milk, low fat cheeses, yogurt, lean meats, chicken, fish, eggs, dried beans, tofu, etc.  For more information go to www.thenutritionsource.org  Dental visit- Brush and floss teeth twice daily; visit your dentist twice a year.  Eye exam- Visit your Optometrist or Ophthalmologist yearly.  Drink alcohol in moderation- Limit alcohol intake to one drink or less a day.  Never drink and drive.  Depression- Your emotional health is as important as your physical health.  If you're feeling down or losing interest in things you normally enjoy, please talk to your healthcare provider.  Seat Belts- can save your life; always wear one  Smoke/Carbon Monoxide detectors- These detectors need to be installed on the appropriate level of your home.  Replace batteries at least once a year.  Violence- If anyone is threatening or hurting you, please tell your healthcare provider.  Living Will/ Health care power of attorney- Discuss with your healthcare provider and family.  

## 2016-01-27 NOTE — Telephone Encounter (Signed)
Spoke with pt and she states that she ordered an [employer] POC today online and was advised that they would be faxing us the paperwork for approval. Advised pt that once the paperwork is received it will be given to BQ to sign. She is asking if TP can sign since she is the one who approved the original order. Checked BQ's inbox and fax folder, form has not been received.

## 2016-01-27 NOTE — Progress Notes (Signed)
Rachel Perez  04-03-1941 75 y.o. female  [facility] today for [facility].  Other items to address today: fatigue and medication refills.  COPD is managed by pulmonology. She needs refills of Spiriva and Symbicort. She is getting a new home O2 concentrator. Activity is limited by her ability to breathe, but she remains very active and involved with her family and friends.   Notes fatigue and dry skin. Concerned that her thyroid is under active again. Has not been on medication in years (more than 10) and was advised by endocrinology that she didn't need it.   Cancer Screening: Cervical: n/a; last pap in 2012. S/p hysterectomy.  No history of abnormal pap testing. Breast: yes mammogram 02/2015 Colon: no "I'm not going to do that." IFOBT was negative 01/2015. Prostate: n/a   DEXA 02/2015   Immunization status: up to date and documented.   ADVANCE DIRECTIVES: Discussed: yes On File: no (she will bring it) Materials Provided: no   Last screening for diabetes: 01/2015 Last EKG: 03/2013 Last lipid screening: 11/2013   Patient Active Problem List   Diagnosis Date Noted  . Dyspnea 06/26/2015  . COPD exacerbation (HCC) 07/03/2013  . History of tobacco use 05/09/2013  [facility]  . History of hypothyroidism 09/30/2010  . Anxiety 09/30/2010  . HEMORRHOIDS 09/30/2010  . COPD (chronic obstructive pulmonary disease) with emphysema (HCC) 09/30/2010  . Osteoporosis 09/30/2010     Past Medical History  Diagnosis Date  . COPD (chronic obstructive pulmonary disease) (HCC)   . H/O bladder infections   . Anxiety   . Arthritis   . Depression   . Osteoporosis   . Thyroid disease      Past Surgical History  Procedure Laterality Date  . Abdominal hysterectomy       Family History  Problem Relation Age of Onset  . Dementia Mother   . Cancer Father     lung  . Heart disease Father   . Thyroid disease Sister   . Thyroid disease Daughter   . Stroke  Maternal Grandfather      Social History   Social History  . Marital Status: Divorced    Spouse Name: n/a  . Number of Children: 2  . Years of Education: 13+   Occupational History  . retired-bookkeeping    Social History Main Topics  . Smoking status: Former Smoker -- 0.50 packs/day for 40 years    Types: Cigarettes    Quit date: 03/30/2013  . Smokeless tobacco: Never Used     Comment: QUIT 03/30/13  . Alcohol Use: 0.0 oz/week    0 Standard drinks or equivalent per week     Comment: mixed drinks -daily  . Drug Use: No  . Sexual Activity: No   Other Topics Concern  . 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   Children - 2   Grandchildren - 4    Exercise - moderately walking   Divorced after 30 years of marriage.     Allergies  Allergen Reactions  . Celebrex [Celecoxib]     Tendonitis  . Levaquin [Levofloxacin]     Tendonitis   . Penicillins Other (See Comments)    psorasis     Prior to Admission medications   Medication Sig Start Date End Date Taking? Authorizing Provider  alendronate (FOSAMAX) 70 MG tablet Take 1 tablet (70 mg total) by mouth every 7 (seven)  days. Take with a full glass of water on an empty stomach. 07/31/15  Yes Morrell Riddle, PA-C  ALPRAZolam Prudy Feeler) 0.5 MG tablet Take 1 tablet (0.5 mg total) by mouth at bedtime as needed. 01/29/15  Yes Morrell Riddle, PA-C  Calcium Carbonate (CALCIUM 500 PO) Take 500 Units by mouth 3 (three) times daily.   Yes Historical Provider, MD  Cholecalciferol (VITAMIN D) 2000 UNITS tablet Take 6,000 Units by mouth daily.    Yes Historical Provider, MD  Cranberry 300 MG tablet Take 300 mg by mouth 2 (two) times daily.   Yes Historical Provider, MD  dextromethorphan-guaiFENesin (MUCINEX DM) 30-600 MG 12hr tablet Take 1 tablet by mouth 2 (two) times daily.   Yes Historical Provider, MD  ipratropium-albuterol (DUONEB) 0.5-2.5 (3) MG/3ML SOLN 1 vial tid 03/19/15   Yes Morrell Riddle, PA-C  PROAIR HFA 108 (90 BASE) MCG/ACT inhaler Inhale 1-2 puffs into the lungs every 6 (six) hours as needed for wheezing or shortness of breath. 03/19/15  Yes Morrell Riddle, PA-C  Respiratory Therapy Supplies (FLUTTER) DEVI Use as directed 06/26/15  Yes Lupita Leash, MD  Spacer/Aero-Holding Chambers (AEROCHAMBER MV) inhaler Use as instructed 06/26/15  Yes Lupita Leash, MD  SPIRIVA HANDIHALER 18 MCG inhalation capsule INHALE CONTENTS OF 1 CAPSULE VIA HANDIHALER AT THE SAME TIME EVERY DAY 10/10/15  Yes Morrell Riddle, PA-C  SYMBICORT 160-4.5 MCG/ACT inhaler INHALE 2 PUFFS INTO THE LUNGS TWICE A DAY 01/15/16  Yes Porfirio Oar, PA-C     Depression screen [facility] - Eat 2/9 01/27/2016 11/04/2015 07/28/2015 03/19/2015 01/29/2015  Decreased Interest 0 0 3 0 0  Down, Depressed, Hopeless 1 0 1 0 0  PHQ - 2 Score 1 0 4 0 0  Altered sleeping - - 2 - -  Tired, decreased energy - - 1 - -  Change in appetite - - 1 - -  Feeling bad or failure about yourself  - - 0 - -  Trouble concentrating - - 3 - -  Moving slowly or fidgety/restless - - 0 - -  Suicidal thoughts - - 0 - -  PHQ-9 Score - - 11 - -     Fall Risk  01/27/2016 07/28/2015 01/29/2015 11/28/2013  Falls in the past year? No No No No      PHYSICAL EXAM: Physical Exam  Constitutional: She is well-developed, well-nourished, and in no distress.  HENT:  Head: Normocephalic and atraumatic.  Eyes: Conjunctivae are normal. No scleral icterus.  Neck: Neck supple. No thyromegaly present.  Cardiovascular: Normal rate, regular rhythm, normal heart sounds and intact distal pulses.   Coolness of the feet, R>L Capillary refill brisk  Pulmonary/Chest: No respiratory distress (increased effort noted). She has decreased breath sounds. She has no wheezes. She has no rhonchi. She has no rales.  Neurological: She displays weakness (dorsum of RIGHT foot). Gait normal.  Skin: Skin is warm and dry.         Visual Acuity Screening   Right eye  Left eye Both eyes  Without correction: 20/100 20/200 20/50  With correction:        BP 112/66 mmHg  Pulse 92  Temp(Src) 98.2 F (36.8 C)  Resp 16  Ht 5\' 7"  (1.702 m)  Wt 172 lb (78.019 kg)  BMI 26.93 kg/m2   Education/Counseling: yes diet and exercise yes prevention of chronic diseases N/A smoking/tobacco cessation yes review "Covered Medicare Preventive Services"    ASSESSMENT/PLAN: 1. Medicare annual wellness visit, subsequent Age appropriate anticipatory  guidance provided.  2. Screening for diabetes mellitus CMET pending.  3. Pulmonary emphysema, unspecified emphysema type (HCC) Continue current treatment and follow-up with pulmonology. Medications refilled. - budesonide-formoterol (SYMBICORT) 160-4.5 MCG/ACT inhaler; INHALE 2 PUFFS INTO THE LUNGS TWICE A DAY  Dispense: 10.2 Inhaler; Refill: 12 - tiotropium (SPIRIVA HANDIHALER) 18 MCG inhalation capsule; Place 1 capsule (18 mcg total) into inhaler and inhale daily.  Dispense: 30 capsule; Refill: 12 - PROAIR HFA 108 (90 Base) MCG/ACT inhaler; Inhale 1-2 puffs into the lungs every 6 (six) hours as needed for wheezing or shortness of breath.  Dispense: 18 g; Refill: 11 - ipratropium-albuterol (DUONEB) 0.5-2.5 (3) MG/3ML SOLN; 1 vial tid  Dispense: 270 mL; Refill: 11  4. Anxiety state Stable. - ALPRAZolam (XANAX) 0.5 MG tablet; Take 1 tablet (0.5 mg total) by mouth at bedtime as needed.  Dispense: 30 tablet; Refill: 0  5. Osteoporosis Continue bisphosphonate and vitamin D, weight bearing exercise, core strengthening. - alendronate (FOSAMAX) 70 MG tablet; Take 1 tablet (70 mg total) by mouth every 7 (seven) days. Take with a full glass of water on an empty stomach.  Dispense: 12 tablet; Refill: 3  6. Other fatigue History of hypothyroidism, but euthyroid off medications for more than a decade. - CBC with Differential/Platelet - TSH - T3, Free - T4, Free - Comprehensive metabolic panel    Fernande Bras,  PA-C Physician Assistant-Certified [facility]

## 2016-01-28 LAB — COMPREHENSIVE METABOLIC PANEL
ALT: 15 U/L (ref 6–29)
AST: 15 U/L (ref 10–35)
Albumin: 4.1 g/dL (ref 3.6–5.1)
Alkaline Phosphatase: 78 U/L (ref 33–130)
BUN: 12 mg/dL (ref 7–25)
CHLORIDE: 104 mmol/L (ref 98–110)
CO2: 26 mmol/L (ref 20–31)
CREATININE: 0.87 mg/dL (ref 0.60–0.93)
Calcium: 9.4 mg/dL (ref 8.6–10.4)
GLUCOSE: 94 mg/dL (ref 65–99)
POTASSIUM: 4.1 mmol/L (ref 3.5–5.3)
SODIUM: 141 mmol/L (ref 135–146)
Total Bilirubin: 0.5 mg/dL (ref 0.2–1.2)
Total Protein: 6.9 g/dL (ref 6.1–8.1)

## 2016-01-28 LAB — T4, FREE: FREE T4: 1.1 ng/dL (ref 0.8–1.8)

## 2016-01-28 LAB — TSH: TSH: 1.71 mIU/L

## 2016-01-28 LAB — T3, FREE: T3 FREE: 2.5 pg/mL (ref 2.3–4.2)

## 2016-01-28 NOTE — Telephone Encounter (Signed)
Form still not in BQ or TP folders up front, or in BQ's look-at.  Will continue to hold.

## 2016-01-29 NOTE — Telephone Encounter (Signed)
Form still has not been received. Spoke with pt. She is needing a new POC >> G4 machine. Advised her that I would just place the order and we would fax it over to [employer]. Order has been placed. Nothing further was needed.

## 2016-02-01 DIAGNOSIS — J438 Other emphysema: Secondary | ICD-10-CM | POA: Diagnosis not present

## 2016-02-24 DIAGNOSIS — H5213 Myopia, bilateral: Secondary | ICD-10-CM | POA: Diagnosis not present

## 2016-02-24 DIAGNOSIS — H524 Presbyopia: Secondary | ICD-10-CM | POA: Diagnosis not present

## 2016-02-24 DIAGNOSIS — H52223 Regular astigmatism, bilateral: Secondary | ICD-10-CM | POA: Diagnosis not present

## 2016-02-24 DIAGNOSIS — H2513 Age-related nuclear cataract, bilateral: Secondary | ICD-10-CM | POA: Diagnosis not present

## 2016-02-26 ENCOUNTER — Telehealth: Payer: Self-pay | Admitting: Pulmonary Disease

## 2016-02-26 NOTE — Telephone Encounter (Signed)
Yes, OK to write letter

## 2016-02-26 NOTE — Telephone Encounter (Signed)
Letter typed and faxed to [facility].  Pt aware.  Nothing further needed.

## 2016-02-26 NOTE — Telephone Encounter (Signed)
Spoke with pt, states that she needs a letter from our office on letterhead saying that pt is cleared to fly without wearing her oxygen- needs to go to [facility] with Pt's full name Rachel Perez([name]) fax # [phone].  Pt's flight leaves at 6:00am tomorrow.  BQ ok to write this letter?  Thanks!

## 2016-03-03 DIAGNOSIS — J438 Other emphysema: Secondary | ICD-10-CM | POA: Diagnosis not present

## 2016-03-13 ENCOUNTER — Other Ambulatory Visit: Payer: Self-pay | Admitting: Physician Assistant

## 2016-03-19 ENCOUNTER — Telehealth: Payer: Self-pay

## 2016-03-19 DIAGNOSIS — J439 Emphysema, unspecified: Secondary | ICD-10-CM

## 2016-03-19 NOTE — Telephone Encounter (Signed)
I advised pt to call pharmacy because we sent medication in. She will and call me back later.

## 2016-03-19 NOTE — Telephone Encounter (Signed)
Patient request to speak with someone about her medication. Patient stated her medication proair expired. [phone].

## 2016-04-02 DIAGNOSIS — J438 Other emphysema: Secondary | ICD-10-CM | POA: Diagnosis not present

## 2016-05-03 DIAGNOSIS — J438 Other emphysema: Secondary | ICD-10-CM | POA: Diagnosis not present

## 2016-05-03 IMAGING — DX DG CHEST 2V
2 series · 2 of 2 positions shown · non-contrast
Comparison: 04/02/2013 and earlier, including high-resolution CT
chest 10/05/2010.

CLINICAL DATA: Former smoker with current history of COPD. Health
maintenance.

EXAM:
CHEST  2 VIEW

[chest pa]
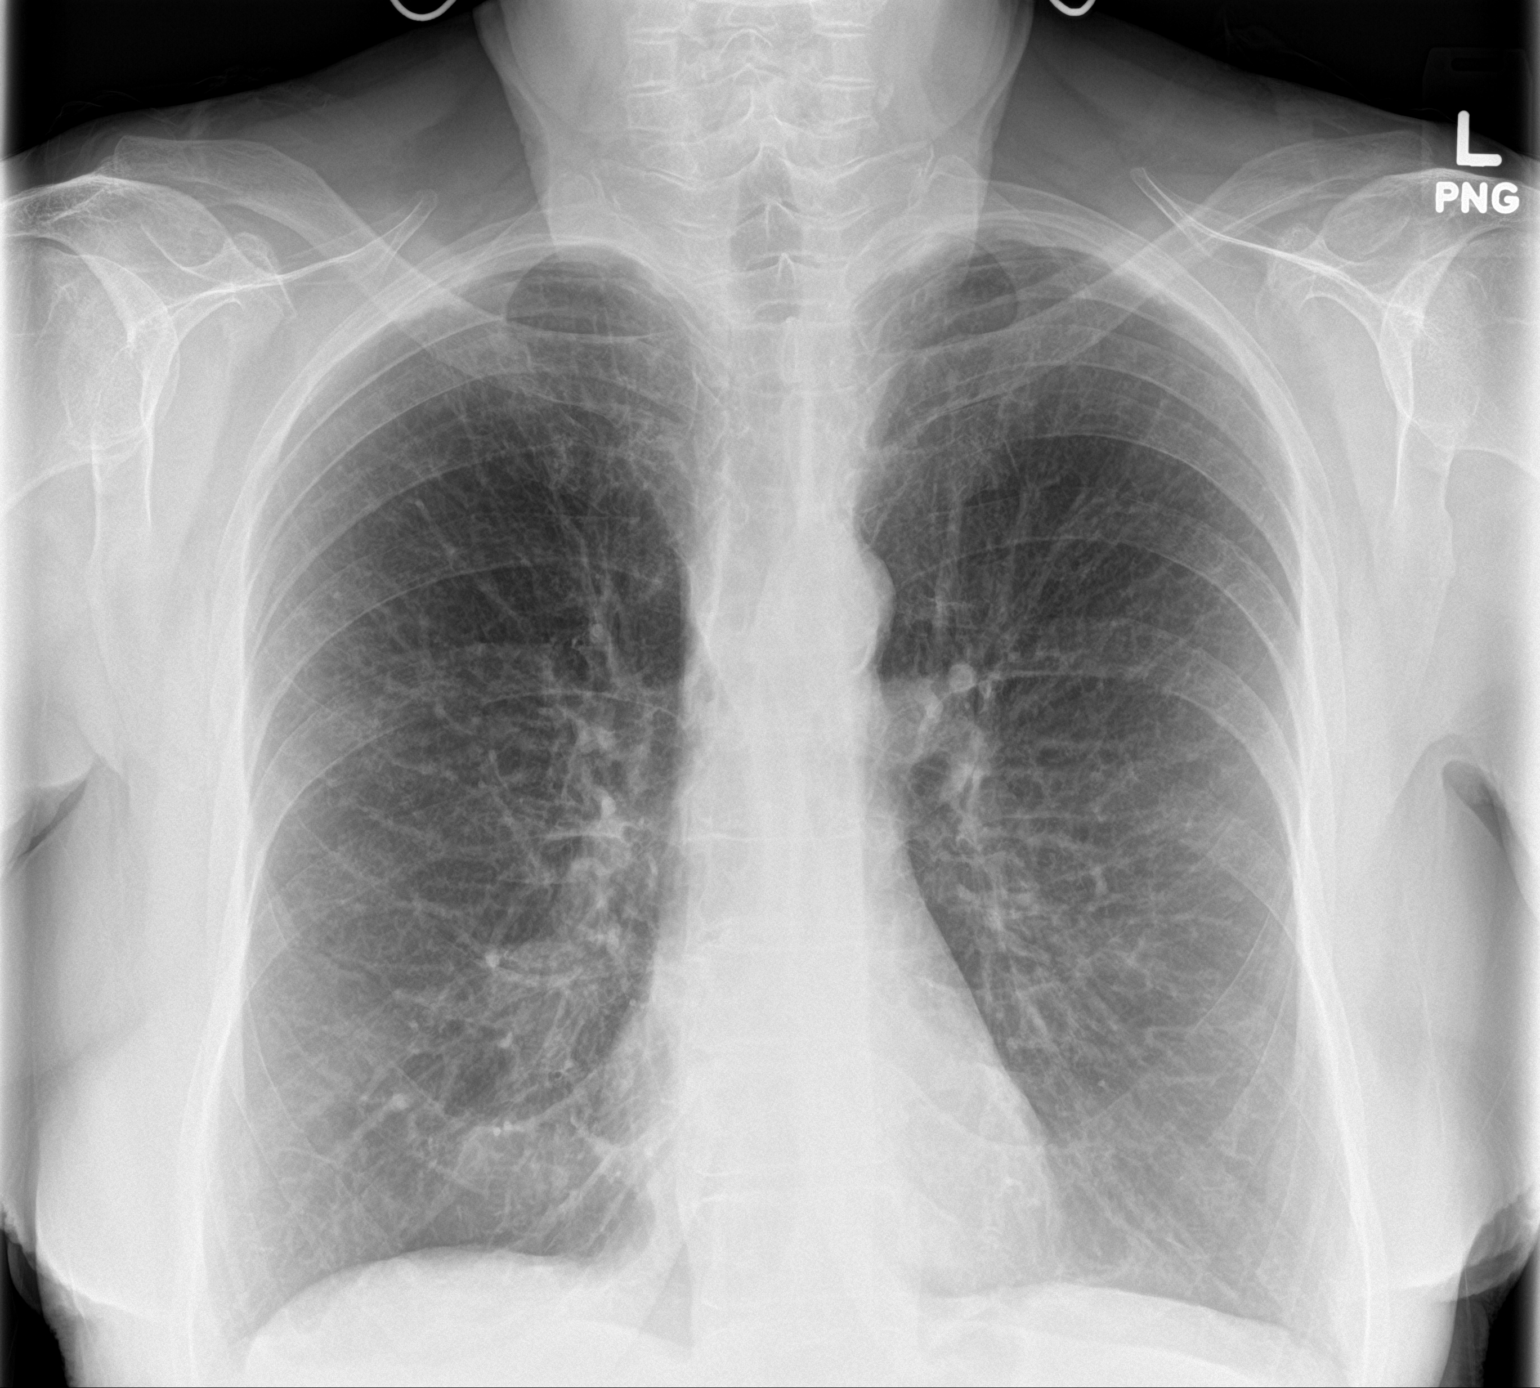

[chest lat]
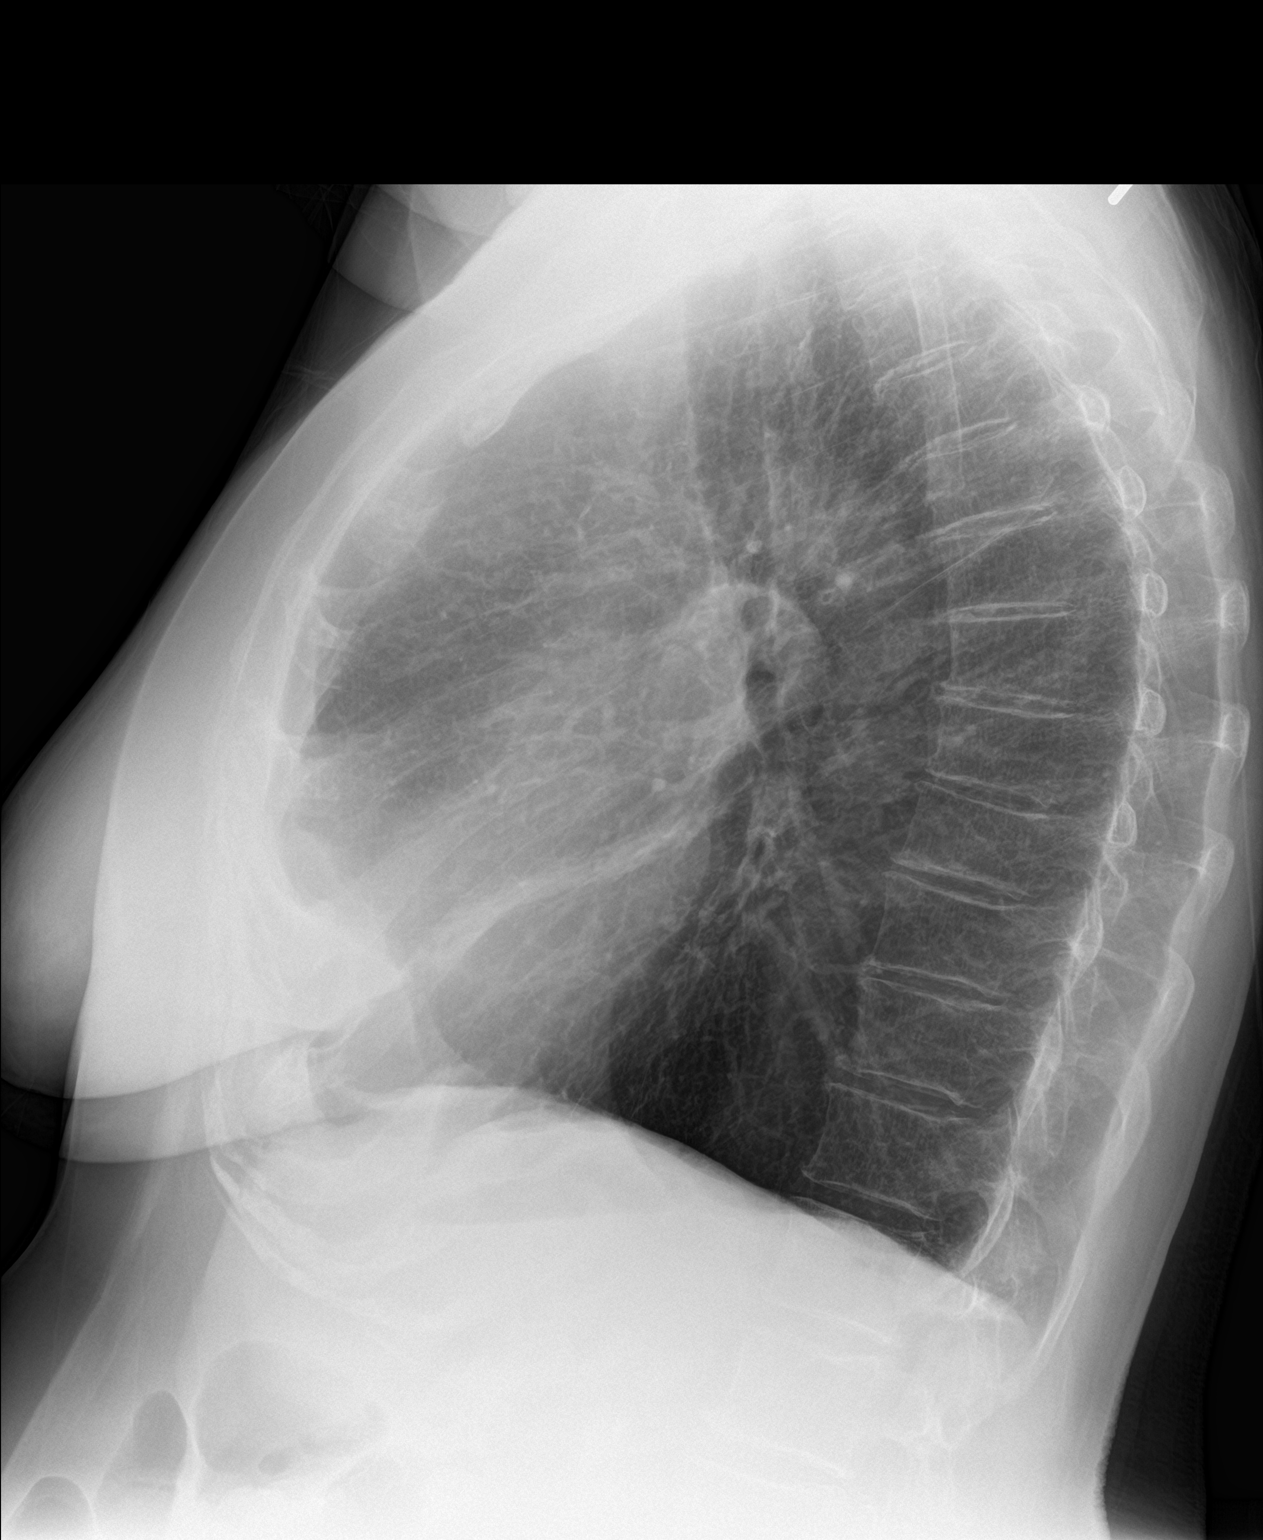

[2 of 2 positions shown; findings below may reference images not displayed]

FINDINGS: Cardiac silhouette normal in size, unchanged. Thoracic aorta
atherosclerotic, unchanged. Mild prominence to the central pulmonary
arteries, unchanged. Hyperinflation with emphysematous changes
throughout both lungs. Mild to moderate central peribronchial
thickening, unchanged from prior examinations. Scar/mild
bronchiectasis in the right middle lobe, unchanged. No new pulmonary
parenchymal abnormalities. No pleural effusions. No pneumothorax.
Mild degenerative changes involving the thoracic spine and
generalized osseous demineralization.
IMPRESSION: 1.  No acute cardiopulmonary disease.
2. COPD/emphysema. Chronic scar/bronchiectasis involving the right
middle lobe.

## 2016-06-07 ENCOUNTER — Encounter: Payer: Self-pay | Admitting: Pulmonary Disease

## 2016-06-07 ENCOUNTER — Ambulatory Visit (INDEPENDENT_AMBULATORY_CARE_PROVIDER_SITE_OTHER): Payer: Medicare Other | Admitting: Pulmonary Disease

## 2016-06-07 ENCOUNTER — Other Ambulatory Visit: Payer: Self-pay | Admitting: Pulmonary Disease

## 2016-06-07 DIAGNOSIS — G4710 Hypersomnia, unspecified: Secondary | ICD-10-CM | POA: Diagnosis not present

## 2016-06-07 DIAGNOSIS — Z23 Encounter for immunization: Secondary | ICD-10-CM

## 2016-06-07 DIAGNOSIS — J432 Centrilobular emphysema: Secondary | ICD-10-CM

## 2016-06-07 DIAGNOSIS — R400 Somnolence: Secondary | ICD-10-CM

## 2016-06-07 DIAGNOSIS — J9611 Chronic respiratory failure with hypoxia: Secondary | ICD-10-CM | POA: Diagnosis not present

## 2016-06-07 NOTE — Assessment & Plan Note (Signed)
She has severe COPD and dyspnea because along with that. She has not had any exacerbations in the last few months with is good. However, she has significant symptoms secondary to not exercising more recently. She is quite deconditioned.  Plan: I encouraged exercise again, she is unable to do this on her own then she will need to go back to pulmonary rehabilitation Continue Symbicort and Spiriva It would be fine for her to have cataract surgery Flu shot today  Greater than 50% of today's 30 minute visit was spent face-to-face

## 2016-06-07 NOTE — Assessment & Plan Note (Signed)
She has been experieincing symptoms of sleep apnea. Patients with COPD are at increased risk for sleep apnea.  Her sleep hygeine is terrible.   Plan: Her view to sleep hygiene today in detail, literature provided Polysomnogram

## 2016-06-07 NOTE — Assessment & Plan Note (Signed)
Continue 2 L of oxygen with rest

## 2016-06-07 NOTE — Patient Instructions (Signed)
We will arrange for a polysomnogram Keep using oxygen when you exert yourself Keep taking your medicines as you are doing We will see you back in 4 months or sooner if needed

## 2016-06-07 NOTE — Progress Notes (Signed)
Subjective:    Patient ID: Rachel Perez, female    DOB: 1941/02/10, 75 y.o.   MRN: [id number]  Synopsis: Former patient of Dr. Shelle Iron who has COPD. PFT's 11/2010:  FEV1 0.76, ratio 32, 47% improvement with BD, no restriction, +airtrapping, DLCO 53%.  November 2016 pulmonary function testing ratio 31%, FEV1 0.75 L (29% predicted, 12% change but not significant), total lung capacity 10.56 L (192% predicted, residual volume 8.54 L (358% predicted), DLCO 8.62 (30% predicted)  HPI Chief Complaint  Patient presents with  . Follow-up    Pt reports she feels breathing is worse. Has very little cough. Denies any wheezing/chest tightness.    Avy Barlett has been doing OK.  She saw Tammy Parrett in March and since then she has had a number of friends die which made her depressed.  She didn't exercise much during this time and by the time she felt up to walking it was hot outside.  She uses Xanax every now and then.  She has been having trouble sleeping at night.  She says that she falls asleep in the chair at 11:30 and doses off until 1:30 when she will go to bed.  She takes an hour to fall asleep, but this sleep is riddled with vivid dreams that will sometimes be terrifying.  She only sleep for a few hours, but then she has a mid morning nap for 2 hours nearly every day.  She has been told that she snores. She has a cat that will get in the bed in the middle of the night at night.  She drinks "a lot".    Past Medical History:  Diagnosis Date  . Anxiety   . Arthritis   . COPD (chronic obstructive pulmonary disease) (HCC)   . Depression   . H/O bladder infections   . Osteoporosis   . Thyroid disease       Review of Systems  Constitutional: Positive for fatigue. Negative for chills and fever.  HENT: Negative for postnasal drip, rhinorrhea and sinus pressure.   Respiratory: Positive for cough and shortness of breath. Negative for wheezing.   Cardiovascular: Negative for chest pain,  palpitations and leg swelling.       Objective:   Physical Exam Vitals:   06/07/16 1324  BP: (!) 146/80  Pulse: 90  SpO2: 90%  Weight: 171 lb 9.6 oz (77.8 kg)  Height: 5\' 7"  (1.702 m)  RA  Gen: well appearing HENT: OP clear, TM's clear, neck supple PULM: Wheezing, mild, good air movement CV: RRR, no mgr, trace edema GI: BS+, soft, nontender Derm: no cyanosis or rash Psyche: normal mood and affect  CBC    Component Value Date/Time   WBC 8.4 01/27/2016 1435   RBC 4.66 01/27/2016 1435   HGB 14.1 01/27/2016 1435   HCT 42.6 01/27/2016 1435   PLT 268 01/27/2016 1435   MCV 91.4 01/27/2016 1435   MCH 30.3 01/27/2016 1435   MCHC 33.1 01/27/2016 1435   RDW 14.3 01/27/2016 1435   LYMPHSABS 1,428 01/27/2016 1435   MONOABS 756 01/27/2016 1435   EOSABS 252 01/27/2016 1435   BASOSABS 84 01/27/2016 1435         Assessment & Plan:  No problem-specific Assessment & Plan notes found for this encounter.   Current Outpatient Prescriptions:  .  acidophilus (RISAQUAD) CAPS capsule, Take 1 capsule by mouth daily., Disp: , Rfl:  .  alendronate (FOSAMAX) 70 MG tablet, Take 1 tablet (70 mg total) by  mouth every 7 (seven) days. Take with a full glass of water on an empty stomach., Disp: 12 tablet, Rfl: 3 .  ALPRAZolam (XANAX) 0.5 MG tablet, Take 1 tablet (0.5 mg total) by mouth at bedtime as needed., Disp: 30 tablet, Rfl: 0 .  budesonide-formoterol (SYMBICORT) 160-4.5 MCG/ACT inhaler, INHALE 2 PUFFS INTO THE LUNGS TWICE A DAY, Disp: 10.2 Inhaler, Rfl: 12 .  Calcium Carbonate (CALCIUM 500 PO), Take 500 Units by mouth 3 (three) times daily., Disp: , Rfl:  .  Cholecalciferol (VITAMIN D) 2000 UNITS tablet, Take 6,000 Units by mouth daily. , Disp: , Rfl:  .  Cranberry 300 MG tablet, Take 300 mg by mouth 2 (two) times daily., Disp: , Rfl:  .  dextromethorphan-guaiFENesin (MUCINEX DM) 30-600 MG 12hr tablet, Take 1 tablet by mouth 2 (two) times daily., Disp: , Rfl:  .  ipratropium-albuterol  (DUONEB) 0.5-2.5 (3) MG/3ML SOLN, 1 vial tid, Disp: 270 mL, Rfl: 11 .  PROAIR HFA 108 (90 Base) MCG/ACT inhaler, Inhale 1-2 puffs into the lungs every 6 (six) hours as needed for wheezing or shortness of breath., Disp: 18 g, Rfl: 11 .  Respiratory Therapy Supplies (FLUTTER) DEVI, Use as directed, Disp: 1 each, Rfl: 0 .  Spacer/Aero-Holding Chambers (AEROCHAMBER MV) inhaler, Use as instructed, Disp: 1 each, Rfl: 0 .  tiotropium (SPIRIVA HANDIHALER) 18 MCG inhalation capsule, Place 1 capsule (18 mcg total) into inhaler and inhale daily., Disp: 30 capsule, Rfl: 12

## 2016-07-18 ENCOUNTER — Ambulatory Visit (HOSPITAL_BASED_OUTPATIENT_CLINIC_OR_DEPARTMENT_OTHER): Payer: Medicare Other | Attending: Pulmonary Disease | Admitting: Pulmonary Disease

## 2016-07-18 DIAGNOSIS — R0902 Hypoxemia: Secondary | ICD-10-CM | POA: Diagnosis not present

## 2016-07-18 DIAGNOSIS — R400 Somnolence: Secondary | ICD-10-CM | POA: Diagnosis present

## 2016-07-23 DIAGNOSIS — G4736 Sleep related hypoventilation in conditions classified elsewhere: Secondary | ICD-10-CM | POA: Diagnosis not present

## 2016-07-23 NOTE — Procedures (Signed)
Patient Name: Rachel Perez, [name] Study Date: 07/18/2016 Gender: Female D.O.B: August 31, 1941 Age (years): [age] Referring Provider: Max Fickleouglas McQuaid Height (inches): 67 Interpreting Physician: Cyril Mourningakesh Arfa Lamarca MD, ABSM Weight (lbs): 173 RPSGT: Wylie HailDavis, Rico BMI: 27 MRN: [id number] Neck Size: 14.50   CLINICAL INFORMATION Sleep Study Type: NPSG Indication for sleep study: snoring, COPD, dreams, non refreshing sleep Epworth Sleepiness Score: 9   SLEEP STUDY TECHNIQUE As per the AASM Manual for the Scoring of Sleep and Associated Events v2.3 (April 2016) with a hypopnea requiring 4% desaturations. The channels recorded and monitored were frontal, central and occipital EEG, electrooculogram (EOG), submentalis EMG (chin), nasal and oral airflow, thoracic and abdominal wall motion, anterior tibialis EMG, snore microphone, electrocardiogram, and pulse oximetry.   MEDICATIONS Medications self-administered by patient taken the night of the study : N/A   SLEEP ARCHITECTURE The study was initiated at 11:16:52 PM and ended at 5:18:01 AM. Sleep onset time was 15.2 minutes and the sleep efficiency was 72.5%. The total sleep time was 262.0 minutes. Stage REM latency was 60.5 minutes. The patient spent 5.73% of the night in stage N1 sleep, 65.27% in stage N2 sleep, 15.46% in stage N3 and 13.55% in REM. Alpha intrusion was absent. Supine sleep was 0.00%.   RESPIRATORY PARAMETERS The overall apnea/hypopnea index (AHI) was 0.5 per hour. There were 1 total apneas, including 0 obstructive, 1 central and 0 mixed apneas. There were 1 hypopneas and 0 RERAs. The AHI during Stage REM sleep was 3.4 per hour. AHI while supine was N/A per hour. The mean oxygen saturation was 88.49%. The minimum SpO2 during sleep was 82.00%. Saturations below 88% for 90 mins Moderate snoring was noted during this study.    CARDIAC DATA The 2 lead EKG demonstrated atrial fibrillation. The mean heart rate was 71.03 beats per minute.  Other EKG findings include: PVCs.   LEG MOVEMENT DATA The total PLMS were 12 with a resulting PLMS index of 2.75. Associated arousal with leg movement index was 0.2 .   IMPRESSIONS - No significant obstructive sleep apnea occurred during this study (AHI = 0.5/h). - No significant central sleep apnea occurred during this study (CAI = 0.2/h). - Mild oxygen desaturation was noted during this study (Min O2 = 82.00%). - The patient snored with Moderate snoring volume. - EKG findings include PVCs. - Clinically significant periodic limb movements did not occur during sleep. No significant associated arousals.    DIAGNOSIS - Nocturnal Hypoxemia (327.26 [G47.36 ICD-10])    RECOMMENDATIONS - Consider nocturnal oxygen if she meets criteria - Avoid alcohol, sedatives and other CNS depressants that may worsen sleep apnea and disrupt normal sleep architecture. - Sleep hygiene should be reviewed to assess factors that may improve sleep quality. - Weight management and regular exercise should be initiated or continued if appropriate.   Cyril Mourningakesh Weston Fulco MD Board Certified in Sleep medicine

## 2016-09-02 ENCOUNTER — Other Ambulatory Visit: Payer: Self-pay

## 2016-09-02 DIAGNOSIS — J439 Emphysema, unspecified: Secondary | ICD-10-CM

## 2016-09-02 NOTE — Telephone Encounter (Signed)
Pt is needing to talk with someone about getting a 90 day supply for her spiriva symbicort and proair  Best number [phone] or [phone]

## 2016-09-03 MED ORDER — BUDESONIDE-FORMOTEROL FUMARATE 160-4.5 MCG/ACT IN AERO: INHALATION_SPRAY | RESPIRATORY_TRACT | 3 refills | Status: DC

## 2016-09-03 MED ORDER — PROAIR HFA 108 (90 BASE) MCG/ACT IN AERS: 1.0000 | INHALATION_SPRAY | Freq: Four times a day (QID) | RESPIRATORY_TRACT | 0 refills | Status: DC | PRN

## 2016-09-03 MED ORDER — TIOTROPIUM BROMIDE MONOHYDRATE 18 MCG IN CAPS: 18.0000 ug | ORAL_CAPSULE | Freq: Every day | RESPIRATORY_TRACT | 3 refills | Status: DC

## 2016-09-03 NOTE — Telephone Encounter (Signed)
Meds ordered this encounter  Medications  . tiotropium (SPIRIVA HANDIHALER) 18 MCG inhalation capsule    Sig: Place 1 capsule (18 mcg total) into inhaler and inhale daily.    Dispense:  90 capsule    Refill:  3  . budesonide-formoterol (SYMBICORT) 160-4.5 MCG/ACT inhaler    Sig: INHALE 2 PUFFS INTO THE LUNGS TWICE A DAY    Dispense:  30.6 Inhaler    Refill:  3  . PROAIR HFA 108 (90 Base) MCG/ACT inhaler    Sig: Inhale 1-2 puffs into the lungs every 6 (six) hours as needed for wheezing or shortness of breath.    Dispense:  54 g    Refill:  0

## 2016-09-03 NOTE — Telephone Encounter (Signed)
Pt req 90 day supply of Pro Air, Spiriva, Symbicort Spoke with pt - she states she is in [facility] and the cost of one will go from $150 per month to $15. States you did this for her last year.   [facility]

## 2016-10-11 ENCOUNTER — Encounter: Payer: Self-pay | Admitting: Pulmonary Disease

## 2016-10-11 ENCOUNTER — Ambulatory Visit (INDEPENDENT_AMBULATORY_CARE_PROVIDER_SITE_OTHER): Payer: Medicare Other | Admitting: Pulmonary Disease

## 2016-10-11 DIAGNOSIS — J9611 Chronic respiratory failure with hypoxia: Secondary | ICD-10-CM

## 2016-10-11 NOTE — Progress Notes (Signed)
Subjective:    Patient ID: Rachel Perez Ann D Lizardo, female    DOB: 1940-11-23, 76 y.o.   MRN: [id number]  Synopsis: Former patient of Dr. Shelle Ironlance who has COPD. PFT's 11/2010:  FEV1 0.76, ratio 32, 47% improvement with BD, no restriction, +airtrapping, DLCO 53%.  November 2016 pulmonary function testing ratio 31%, FEV1 0.75 L (29% predicted, 12% change but not significant), total lung capacity 10.56 L (192% predicted, residual volume 8.54 L (358% predicted), DLCO 8.62 (30% predicted) Sleep study in November 2017 showed nocturnal hypoxemia (82% on 2L) but no evidence of obstructive sleep apnea.  HPI Chief Complaint  Patient presents with  . Follow-up    pt states stress level is worsening her breathing- increased SOB.    Rachel Perez's mother died last month up in [place] and there has been a lot of family stress surrounding this.  This has really stressed her out lately.    She has not been exercising due to all the snow lately.  She bought a rolling walker recently and she plans to start walking more frequently.  She has been using and benefiting from her supplemental oxygen which she uses when she exercises.  She doesn't use it when she sleeps.   She recently had a hard time climbing stairs when she went to a basketball game.      Past Medical History:  Diagnosis Date  . Anxiety   . Arthritis   . COPD (chronic obstructive pulmonary disease) (HCC)   . Depression   . H/O bladder infections   . Osteoporosis   . Thyroid disease       Review of Systems  Constitutional: Positive for fatigue. Negative for chills and fever.  HENT: Negative for postnasal drip, rhinorrhea and sinus pressure.   Respiratory: Positive for cough and shortness of breath. Negative for wheezing.   Cardiovascular: Negative for chest pain, palpitations and leg swelling.       Objective:   Physical Exam Vitals:   10/11/16 1331  BP: 132/78  Pulse: 83  SpO2: 91%  Weight: 172 lb (78 kg)  Height: 5\' 7"  (1.702 m)   RA  Gen: chronically ill appearing HENT: OP clear, TM's clear, neck supple PULM: Crackles bases B, poor air movement normal percussion CV: RRR, no mgr, trace edema GI: BS+, soft, nontender Derm: no cyanosis or rash Psyche: normal mood and affect   CBC    Component Value Date/Time   WBC 8.4 01/27/2016 1435   RBC 4.66 01/27/2016 1435   HGB 14.1 01/27/2016 1435   HCT 42.6 01/27/2016 1435   PLT 268 01/27/2016 1435   MCV 91.4 01/27/2016 1435   MCH 30.3 01/27/2016 1435   MCHC 33.1 01/27/2016 1435   RDW 14.3 01/27/2016 1435   LYMPHSABS 1,428 01/27/2016 1435   MONOABS 756 01/27/2016 1435   EOSABS 252 01/27/2016 1435   BASOSABS 84 01/27/2016 1435         Assessment & Plan:  Chronic respiratory failure with hypoxia (HCC) Continue 2L [place] with exertion and sleep Check ONO on oxygen  COPD (chronic obstructive pulmonary disease) with emphysema (HCC) She has not been exercising so unsuprisingly she has some increased dyspnea.  She really needs to start exercising more frequently.  She has been compliant with her medications.   Plan: Continue Symbicort and Spiriva Continue albuterol prn   Current Outpatient Prescriptions:  .  acidophilus (RISAQUAD) CAPS capsule, Take 1 capsule by mouth daily., Disp: , Rfl:  .  alendronate (FOSAMAX) 70 MG tablet,  Take 1 tablet (70 mg total) by mouth every 7 (seven) days. Take with a full glass of water on an empty stomach., Disp: 12 tablet, Rfl: 3 .  ALPRAZolam (XANAX) 0.5 MG tablet, Take 1 tablet (0.5 mg total) by mouth at bedtime as needed., Disp: 30 tablet, Rfl: 0 .  budesonide-formoterol (SYMBICORT) 160-4.5 MCG/ACT inhaler, INHALE 2 PUFFS INTO THE LUNGS TWICE A DAY, Disp: 30.6 Inhaler, Rfl: 3 .  Calcium Carbonate (CALCIUM 500 PO), Take 500 Units by mouth 3 (three) times daily., Disp: , Rfl:  .  Cholecalciferol (VITAMIN D) 2000 UNITS tablet, Take 6,000 Units by mouth daily. , Disp: , Rfl:  .  Cranberry 300 MG tablet, Take 300 mg by mouth 2  (two) times daily., Disp: , Rfl:  .  dextromethorphan-guaiFENesin (MUCINEX DM) 30-600 MG 12hr tablet, Take 1 tablet by mouth 2 (two) times daily., Disp: , Rfl:  .  ipratropium-albuterol (DUONEB) 0.5-2.5 (3) MG/3ML SOLN, 1 vial tid, Disp: 270 mL, Rfl: 11 .  PROAIR HFA 108 (90 Base) MCG/ACT inhaler, Inhale 1-2 puffs into the lungs every 6 (six) hours as needed for wheezing or shortness of breath., Disp: 54 g, Rfl: 0 .  Respiratory Therapy Supplies (FLUTTER) DEVI, Use as directed, Disp: 1 each, Rfl: 0 .  Spacer/Aero-Holding Chambers (AEROCHAMBER MV) inhaler, Use as instructed, Disp: 1 each, Rfl: 0 .  tiotropium (SPIRIVA HANDIHALER) 18 MCG inhalation capsule, Place 1 capsule (18 mcg total) into inhaler and inhale daily., Disp: 90 capsule, Rfl: 3

## 2016-10-11 NOTE — Assessment & Plan Note (Signed)
She has not been exercising so unsuprisingly she has some increased dyspnea.  She really needs to start exercising more frequently.  She has been compliant with her medications.   Plan: Continue Symbicort and Spiriva Continue albuterol prn

## 2016-10-11 NOTE — Assessment & Plan Note (Addendum)
Continue 2L [place] with exertion and sleep Check ONO on oxygen

## 2016-10-11 NOTE — Patient Instructions (Signed)
Use 2 L of oxygen when you sleep and when you exert yourself We will check your oxygen when you're sleeping We will see you back in 4 months or sooner if needed

## 2016-11-03 ENCOUNTER — Telehealth: Payer: Self-pay

## 2016-11-03 NOTE — Telephone Encounter (Signed)
-----   Message from Lupita Leashouglas B McQuaid, MD sent at 11/03/2016 11:11 AM EST ----- A, Please let her know that her overnight oximetry test on 2 L was normal. Thanks, Kipp BroodBrent

## 2016-11-03 NOTE — Telephone Encounter (Signed)
Spoke with pt, aware of results/recs.  Nothing further needed.  

## 2017-01-04 ENCOUNTER — Encounter: Payer: Self-pay | Admitting: Physician Assistant

## 2017-01-04 ENCOUNTER — Ambulatory Visit (INDEPENDENT_AMBULATORY_CARE_PROVIDER_SITE_OTHER): Payer: Medicare Other | Admitting: Physician Assistant

## 2017-01-04 VITALS — BP 122/87 | HR 75 | Temp 98.4°F | Resp 20 | Ht 67.0 in | Wt 170.6 lb

## 2017-01-04 DIAGNOSIS — Z0000 Encounter for general adult medical examination without abnormal findings: Secondary | ICD-10-CM

## 2017-01-04 DIAGNOSIS — F419 Anxiety disorder, unspecified: Secondary | ICD-10-CM

## 2017-01-04 DIAGNOSIS — Z8639 Personal history of other endocrine, nutritional and metabolic disease: Secondary | ICD-10-CM

## 2017-01-04 DIAGNOSIS — J439 Emphysema, unspecified: Secondary | ICD-10-CM | POA: Diagnosis not present

## 2017-01-04 DIAGNOSIS — E559 Vitamin D deficiency, unspecified: Secondary | ICD-10-CM

## 2017-01-04 DIAGNOSIS — F418 Other specified anxiety disorders: Secondary | ICD-10-CM

## 2017-01-04 DIAGNOSIS — F411 Generalized anxiety disorder: Secondary | ICD-10-CM

## 2017-01-04 DIAGNOSIS — J9611 Chronic respiratory failure with hypoxia: Secondary | ICD-10-CM | POA: Diagnosis not present

## 2017-01-04 DIAGNOSIS — Z131 Encounter for screening for diabetes mellitus: Secondary | ICD-10-CM | POA: Diagnosis not present

## 2017-01-04 DIAGNOSIS — F331 Major depressive disorder, recurrent, moderate: Secondary | ICD-10-CM | POA: Diagnosis not present

## 2017-01-04 DIAGNOSIS — M810 Age-related osteoporosis without current pathological fracture: Secondary | ICD-10-CM

## 2017-01-04 DIAGNOSIS — E2839 Other primary ovarian failure: Secondary | ICD-10-CM

## 2017-01-04 MED ORDER — PROAIR HFA 108 (90 BASE) MCG/ACT IN AERS: 1.0000 | INHALATION_SPRAY | Freq: Four times a day (QID) | RESPIRATORY_TRACT | 3 refills | Status: DC | PRN

## 2017-01-04 MED ORDER — TIOTROPIUM BROMIDE MONOHYDRATE 18 MCG IN CAPS: 18.0000 ug | ORAL_CAPSULE | Freq: Every day | RESPIRATORY_TRACT | 3 refills | Status: DC

## 2017-01-04 MED ORDER — BUDESONIDE-FORMOTEROL FUMARATE 160-4.5 MCG/ACT IN AERO: INHALATION_SPRAY | RESPIRATORY_TRACT | 3 refills | Status: DC

## 2017-01-04 MED ORDER — IPRATROPIUM-ALBUTEROL 0.5-2.5 (3) MG/3ML IN SOLN: RESPIRATORY_TRACT | 11 refills | Status: DC

## 2017-01-04 MED ORDER — ALPRAZOLAM 0.5 MG PO TABS: 0.5000 mg | ORAL_TABLET | Freq: Every evening | ORAL | 0 refills | Status: DC | PRN

## 2017-01-04 MED ORDER — ESCITALOPRAM OXALATE 10 MG PO TABS: 10.0000 mg | ORAL_TABLET | Freq: Every day | ORAL | 1 refills | Status: DC

## 2017-01-04 NOTE — Assessment & Plan Note (Signed)
Trial of Lexapro - she has been on Wellbutrin in the past and that worked well but she is having much more current anxiety.

## 2017-01-04 NOTE — Assessment & Plan Note (Signed)
Check labs - pt has not needed medications recently.

## 2017-01-04 NOTE — Progress Notes (Deleted)
Rachel Perez  MRN: [id number] DOB: 1941-06-04  PCP: Virgilio Belling  Chief Complaint  Patient presents with  . Annual Exam    depression scale during triage, score 5, (would like to discuss Fosamax)    Subjective:  Pt presents to clinic for medicare wellness.  COPD - managed by pulmonology  Cancer screening Cervical - N/a -  Breast - 02/2016 - normal Colon - not interested - unable to be put to sleep due to COPD  Dexa - 02/2015 --   Immunization -   Nurse to the house - Prolia - cannot remember to take it  Advance Directives -  Medical power of attorney, DNR, living will -- attorney has this information - children know her wishes   Diabetes screen - 5/17 - normal glucose Last EKG - 7/14 Lipids 3/15 - normal then  Anxiety - bad along with depression - almost out of Xanax     Review of Systems  Patient Active Problem List   Diagnosis Date Noted  . Daytime somnolence 06/07/2016  . Chronic respiratory failure with hypoxia (HCC) 06/07/2016  . History of rheumatic fever 01/27/2016  . Vitamin D deficiency 01/27/2016  . Dyspnea 06/26/2015  . History of tobacco use 05/09/2013  . [facility] 10/01/2010  . History of hypothyroidism 09/30/2010  . Anxiety 09/30/2010  . HEMORRHOIDS 09/30/2010  . COPD (chronic obstructive pulmonary disease) with emphysema (HCC) 09/30/2010  . Osteoporosis 09/30/2010    Current Outpatient Prescriptions on File Prior to Visit  Medication Sig Dispense Refill  . acidophilus (RISAQUAD) CAPS capsule Take 1 capsule by mouth daily.    Marland Kitchen alendronate (FOSAMAX) 70 MG tablet Take 1 tablet (70 mg total) by mouth every 7 (seven) days. Take with a full glass of water on an empty stomach. 12 tablet 3  . ALPRAZolam (XANAX) 0.5 MG tablet Take 1 tablet (0.5 mg total) by mouth at bedtime as needed. 30 tablet 0  . budesonide-formoterol (SYMBICORT) 160-4.5 MCG/ACT inhaler INHALE 2 PUFFS INTO THE LUNGS TWICE A DAY 30.6 Inhaler 3  . Calcium Carbonate (CALCIUM  500 PO) Take 500 Units by mouth 3 (three) times daily.    . Cholecalciferol (VITAMIN D) 2000 UNITS tablet Take 6,000 Units by mouth daily.     . Cranberry 300 MG tablet Take 300 mg by mouth 2 (two) times daily.    Marland Kitchen dextromethorphan-guaiFENesin (MUCINEX DM) 30-600 MG 12hr tablet Take 1 tablet by mouth 2 (two) times daily.    Marland Kitchen ipratropium-albuterol (DUONEB) 0.5-2.5 (3) MG/3ML SOLN 1 vial tid 270 mL 11  . PROAIR HFA 108 (90 Base) MCG/ACT inhaler Inhale 1-2 puffs into the lungs every 6 (six) hours as needed for wheezing or shortness of breath. 54 g 0  . Respiratory Therapy Supplies (FLUTTER) DEVI Use as directed 1 each 0  . Spacer/Aero-Holding Chambers (AEROCHAMBER MV) inhaler Use as instructed 1 each 0  . tiotropium (SPIRIVA HANDIHALER) 18 MCG inhalation capsule Place 1 capsule (18 mcg total) into inhaler and inhale daily. 90 capsule 3   No current facility-administered medications on file prior to visit.     Allergies  Allergen Reactions  . Celebrex [Celecoxib]     Tendonitis  . Levaquin [Levofloxacin]     Tendonitis   . Penicillins Other (See Comments)    psorasis    Pt patients past, family and social history were reviewed and updated.   Objective:  BP 122/87   Pulse 75   Temp 98.4 F (36.9 C) (Oral)   Resp  20   Ht  (1.702 m)   Wt 170 lb 9.6 oz (77.4 kg)   SpO2 91%   BMI 26.72 kg/m   Physical Exam  Neck: Carotid bruit is present (bilateral).    Assessment and Plan :  No diagnosis found.  Benny Lennert PA-C  Primary Care at [facility] 01/04/2017 2:18 PM

## 2017-01-04 NOTE — Assessment & Plan Note (Signed)
Check labs and continut OTC supplements

## 2017-01-04 NOTE — Progress Notes (Signed)
Presents today for Welcome to Medicare Visit.   Interpreter used for this visit? no  Patient Care Team: Mancel Bale, PA-C as PCP - General (Physician Assistant) Juanito Doom, MD as Consulting Physician (Pulmonary Disease) Melvenia Needles, NP as Nurse Practitioner (Pulmonary Disease)  Other items to address today:  Depression/anxiety - had a really hard year - more depression - used almost all her Xanax this year which is atypical for her - cries all the time - sad a lot - a lot of friends are dying which is hard - she knows her health is not going to get better - COPD - breathing is getting harder - uses her medications daily - trying to walk but it is hard - finished pulmonary rehab - tried Oxygen at night but made her breathing better at night but worse during the day -- walks with Oxygen Osteoporosis - forgets to take her fosamax - does not use her Calcium like she should -   Cancer Screening: Cervical: n/a Breast: yes - 6/17 last mammo Colon: no - not a candidate due to not a candidate for anesthesia  Other Screening: Last screening for diabetes: 2017 Last lipid screening: 2015   ADVANCE DIRECTIVES: Discussed: yes On File: no - attorney has a copy - children are aware of her wishes and have a copy - DNR and living will and medical power of attorney Materials Provided: no  Immunization status:  Immunization History  Administered Date(s) Administered  . Influenza Split 11/03/2011, 06/21/2013  . Influenza, High Dose Seasonal PF 06/07/2016  . Influenza,inj,Quad PF,36+ Mos 10/15/2014, 06/26/2015  . Pneumococcal Conjugate-13 10/15/2014  . Pneumococcal Polysaccharide-23 09/10/2009  . Td 09/10/2009  . Zoster 01/22/2010     There are no preventive care reminders to display for this patient.   Home Environment: lives alone - exercises with a walker for stability and feels weak and also uses it to carry her oxygen   Patient Active Problem List   Diagnosis Date  Noted  . Moderate episode of recurrent major depressive disorder ([place]) 01/04/2017  . Daytime somnolence 06/07/2016  . Chronic respiratory failure with hypoxia ([place]) 06/07/2016  . History of rheumatic fever 01/27/2016  . Vitamin D deficiency 01/27/2016  . Dyspnea 06/26/2015  . History of tobacco use 05/09/2013  . [facility] 10/01/2010  . History of hypothyroidism 09/30/2010  . Anxiety 09/30/2010  . HEMORRHOIDS 09/30/2010  . COPD (chronic obstructive pulmonary disease) with emphysema ([place]) 09/30/2010  . Osteoporosis 09/30/2010     Past Medical History:  Diagnosis Date  . Anxiety   . Arthritis   . Cataract   . COPD (chronic obstructive pulmonary disease) ([place])   . Depression   . Emphysema of lung ([place])   . H/O bladder infections   . Osteoporosis   . Thyroid disease      Past Surgical History:  Procedure Laterality Date  . ABDOMINAL HYSTERECTOMY       Family History  Problem Relation Age of Onset  . Dementia Mother   . Cancer Father     lung  . Heart disease Father   . Thyroid disease Sister   . Thyroid disease Daughter   . Cancer Maternal Grandmother   . Stroke Maternal Grandfather      Social History   Social History  . Marital status: Divorced    Spouse name: n/a  . Number of children: 2  . Years of education: 13+   Occupational History  . retired-bookkeeping Retired  Social History Main Topics  . Smoking status: Former Smoker    Packs/day: 0.50    Years: 40.00    Types: Cigarettes    Quit date: 03/30/2013  . Smokeless tobacco: Never Used     Comment: QUIT 03/30/13  . Alcohol use 0.6 oz/week    1 Standard drinks or equivalent per week     Comment: mixed drinks -daily  . Drug use: No  . Sexual activity: No   Other Topics Concern  . 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   Children - 2   Grandchildren - 4    Exercise - walking - daily 10 mins - 2-3 x/day - walks with  oxygen and a walker for weakness and to carry her oxygen   Divorced after 30 years of marriage.     Allergies  Allergen Reactions  . Celebrex [Celecoxib]     Tendonitis  . Levaquin [Levofloxacin]     Tendonitis   . Penicillins Other (See Comments)    psorasis     Prior to Admission medications   Medication Sig Start Date End Date Taking? Authorizing Provider  acidophilus (RISAQUAD) CAPS capsule Take 1 capsule by mouth daily.   Yes Historical Provider, MD  alendronate (FOSAMAX) 70 MG tablet Take 1 tablet (70 mg total) by mouth every 7 (seven) days. Take with a full glass of water on an empty stomach. 01/27/16  Yes [name] Jeffery, PA-C  ALPRAZolam (XANAX) 0.5 MG tablet Take 1 tablet (0.5 mg total) by mouth at bedtime as needed. 01/04/17  Yes Mancel Bale, PA-C  budesonide-formoterol (SYMBICORT) 160-4.5 MCG/ACT inhaler INHALE 2 PUFFS INTO THE LUNGS TWICE A DAY 01/04/17  Yes Mancel Bale, PA-C  Calcium Carbonate (CALCIUM 500 PO) Take 500 Units by mouth 3 (three) times daily.   Yes Historical Provider, MD  Cholecalciferol (VITAMIN D) 2000 UNITS tablet Take 6,000 Units by mouth daily.    Yes Historical Provider, MD  Cranberry 300 MG tablet Take 300 mg by mouth 2 (two) times daily.   Yes Historical Provider, MD  dextromethorphan-guaiFENesin (MUCINEX DM) 30-600 MG 12hr tablet Take 1 tablet by mouth 2 (two) times daily.   Yes Historical Provider, MD  ipratropium-albuterol (DUONEB) 0.5-2.5 (3) MG/3ML SOLN 1 vial tid 01/04/17  Yes Mancel Bale, PA-C  PROAIR HFA 108 (90 Base) MCG/ACT inhaler Inhale 1-2 puffs into the lungs every 6 (six) hours as needed for wheezing or shortness of breath. 01/04/17  Yes Mancel Bale, PA-C  Respiratory Therapy Supplies (FLUTTER) DEVI Use as directed 06/26/15  Yes Juanito Doom, MD  Spacer/Aero-Holding Chambers (AEROCHAMBER MV) inhaler Use as instructed 06/26/15  Yes Juanito Doom, MD  tiotropium (SPIRIVA HANDIHALER) 18 MCG inhalation capsule Place 1 capsule  (18 mcg total) into inhaler and inhale daily. 01/04/17  Yes Mancel Bale, PA-C  escitalopram (LEXAPRO) 10 MG tablet Take 1 tablet (10 mg total) by mouth daily. 01/04/17   Mancel Bale, PA-C     Depression screen [facility] 2/9 01/04/2017 01/27/2016 11/04/2015 07/28/2015 03/19/2015  Decreased Interest 2 0 0 3 0  Down, Depressed, Hopeless 0 1 0 1 0  PHQ - 2 Score 2 1 0 4 0  Altered sleeping 0 - - 2 -  Tired, decreased energy 1 - - 1 -  Change in appetite 0 - - 1 -  Feeling bad or failure about yourself  1 - -  0 -  Trouble concentrating 1 - - 3 -  Moving slowly or fidgety/restless 0 - - 0 -  Suicidal thoughts 0 - - 0 -  PHQ-9 Score 5 - - 11 -  Difficult doing work/chores Not difficult at all - - - -     Fall Risk  01/04/2017 01/27/2016 07/28/2015 01/29/2015 11/28/2013  Falls in the past year? No No No No No     Functional Status Survey: Is the patient deaf or have difficulty hearing?: Yes Does the patient have difficulty seeing, even when wearing glasses/contacts?: Yes (due to cataract surgery) Does the patient have difficulty concentrating, remembering, or making decisions?: No Does the patient have difficulty walking or climbing stairs?: Yes (climbing stairs due to breathing) Does the patient have difficulty dressing or bathing?: No Does the patient have difficulty doing errands alone such as visiting a doctor's office or shopping?: No    ELECTROCARDIOGRAM: Last - 7/14  PHYSICAL EXAM: BP 122/87   Pulse 75   Temp 98.4 F (36.9 C) (Oral)   Resp 20   Ht 5' 7"  (1.702 m)   Wt 170 lb 9.6 oz (77.4 kg)   SpO2 91%   BMI 26.72 kg/m    Wt Readings from Last 3 Encounters:  01/04/17 170 lb 9.6 oz (77.4 kg)  10/11/16 172 lb (78 kg)  07/18/16 173 lb (78.5 kg)     Visual Acuity Screening   Right eye Left eye Both eyes  Without correction:     With correction: 20/70 20/70 20/70     Physical Exam  Constitutional: She is oriented to person, place, and time. She appears well-developed.    HENT:  Head: Normocephalic and atraumatic.  Right Ear: External ear normal.  Left Ear: External ear normal.  Eyes: Conjunctivae and EOM are normal. Pupils are equal, round, and reactive to light.  Neck: Trachea normal and normal range of motion. Neck supple. Carotid bruit is present (bilateral).  Cardiovascular: Normal rate and normal heart sounds.   Respiratory: She is in respiratory distress. She has wheezes.  GI: Soft.  Musculoskeletal: Normal range of motion.  Neurological: She is alert and oriented to person, place, and time.  Skin: Skin is warm and dry. There is pallor.  Psychiatric: Her behavior is normal. Judgment and thought content normal. She exhibits a depressed mood.   Education/Counseling: yes diet and exercise yes prevention of chronic diseases no smoking/tobacco cessation - pt currently does not smoke yes review "Covered Medicare Preventive Services"  ASSESSMENT/PLAN: Problem List Items Addressed This Visit      Respiratory   Chronic respiratory failure with hypoxia ([place])    Continue f/u with Dr [place] Bells      COPD (chronic obstructive pulmonary disease) with emphysema ([place])    Continue exercising as this will only help her. Continue f/u with pulmonology.  Continue medications as directed by pulmonology.  Continue oxygen use during exercise.      Relevant Medications   PROAIR HFA 108 (90 Base) MCG/ACT inhaler   tiotropium (SPIRIVA HANDIHALER) 18 MCG inhalation capsule   budesonide-formoterol (SYMBICORT) 160-4.5 MCG/ACT inhaler   ipratropium-albuterol (DUONEB) 0.5-2.5 (3) MG/3ML SOLN     Musculoskeletal and Integument   Osteoporosis    Pt will try and set a reminder to take her Fosamax.  Nurse case manager suggested Prolia - if she is unable to remember to take her medications we will consider referral to endocrinology.  She had her last bone density 6/16 - ordered Bone density repeat for after 6/18.  She will try and remember her daily calcium.      Relevant  Orders   VITAMIN D 25 Hydroxy (Vit-D Deficiency, Fractures)   DG Bone Density     Other   Anxiety    Recent increase - continue Xanax prn - start lexapro - recheck in 6 weeks      Relevant Medications   escitalopram (LEXAPRO) 10 MG tablet   ALPRAZolam (XANAX) 0.5 MG tablet   RESOLVED: Depression with anxiety    Trial of lexapro - pt has been on Wellbutrin in the past with success but her anxiety is currently worse than it was then - if the lexapro does not help we will switch to wellbutrin.      History of hypothyroidism    Check labs - pt has not needed medications recently.      Relevant Orders   TSH   Moderate episode of recurrent major depressive disorder (HCC)    Trial of Lexapro - she has been on Wellbutrin in the past and that worked well but she is having much more current anxiety.        Relevant Medications   escitalopram (LEXAPRO) 10 MG tablet   ALPRAZolam (XANAX) 0.5 MG tablet   Vitamin D deficiency    Check labs and continut OTC supplements      Relevant Orders   VITAMIN D 25 Hydroxy (Vit-D Deficiency, Fractures)    Other Visit Diagnoses    Medicare annual wellness visit, subsequent    -  Primary   Annual physical exam       Anxiety state       Relevant Medications   escitalopram (LEXAPRO) 10 MG tablet   ALPRAZolam (XANAX) 0.5 MG tablet   Screening for diabetes mellitus (DM)       Relevant Orders   CMP14+EGFR   Estrogen deficiency       Relevant Orders   VITAMIN D 25 Hydroxy (Vit-D Deficiency, Fractures)

## 2017-01-04 NOTE — Assessment & Plan Note (Signed)
Continue exercising as this will only help her. Continue f/u with pulmonology.  Continue medications as directed by pulmonology.  Continue oxygen use during exercise.

## 2017-01-04 NOTE — Assessment & Plan Note (Signed)
Continue f/u with Dr Kendrick Fries

## 2017-01-04 NOTE — Assessment & Plan Note (Signed)
Recent increase - continue Xanax prn - start lexapro - recheck in 6 weeks

## 2017-01-04 NOTE — Assessment & Plan Note (Signed)
Pt will try and set a reminder to take her Fosamax.  Nurse case manager suggested Prolia - if she is unable to remember to take her medications we will consider referral to endocrinology.  She had her last bone density 6/16 - ordered Bone density repeat for after 6/18.  She will try and remember her daily calcium.

## 2017-01-04 NOTE — Patient Instructions (Addendum)
In order for your supplemental calcium to be effective.  Please take calcium either on an empty stomach or with food that does not contain calcium.  Your body is able only to absorb  of Calcium at a time from any one source.  Do not take with a MVI with iron because iron does not allow th calcium to be absorbed.  Please take Calcium citrate  2-3x/day.  This supplement should have Vit D in it and if your pills do not please add addition Vit D 400 IU with each dose.  Start the lexapro - 1/2 pill for the last 3 pills then a whole pill until you see me in 6 weeks  IF you received an x-ray today, you will receive an invoice from [facility]. Please contact [facility] at [phone] with questions or concerns regarding your invoice.   IF you received labwork today, you will receive an invoice from [facility] [facility]. Please contact [facility] at [phone] with questions or concerns regarding your invoice.   Our billing staff will not be able to assist you with questions regarding bills from these companies.  You will be contacted with the lab results as soon as they are available. The fastest way to get your results is to activate your My Chart account. Instructions are located on the last page of this paperwork. If you have not heard from [place] regarding the results in 2 weeks, please contact this office.

## 2017-01-04 NOTE — Assessment & Plan Note (Signed)
Trial of lexapro - pt has been on Wellbutrin in the past with success but her anxiety is currently worse than it was then - if the lexapro does not help we will switch to wellbutrin.

## 2017-01-05 LAB — CMP14+EGFR
ALK PHOS: 84 IU/L (ref 39–117)
ALT: 15 IU/L (ref 0–32)
AST: 15 IU/L (ref 0–40)
Albumin/Globulin Ratio: 1.8 (ref 1.2–2.2)
Albumin: 4.2 g/dL (ref 3.5–4.8)
BILIRUBIN TOTAL: 0.4 mg/dL (ref 0.0–1.2)
BUN/Creatinine Ratio: 12 (ref 12–28)
BUN: 11 mg/dL (ref 8–27)
CALCIUM: 9.7 mg/dL (ref 8.7–10.3)
CHLORIDE: 104 mmol/L (ref 96–106)
CO2: 26 mmol/L (ref 18–29)
Creatinine, Ser: 0.93 mg/dL (ref 0.57–1.00)
GFR calc Af Amer: 71 mL/min/{1.73_m2} (ref >59)
GFR calc non Af Amer: 61 mL/min/{1.73_m2} (ref >59)
GLOBULIN, TOTAL: 2.4 g/dL (ref 1.5–4.5)
Glucose: 80 mg/dL (ref 65–99)
Potassium: 3.9 mmol/L (ref 3.5–5.2)
SODIUM: 148 mmol/L — AB (ref 134–144)
TOTAL PROTEIN: 6.6 g/dL (ref 6.0–8.5)

## 2017-01-12 LAB — VITAMIN D 25 HYDROXY (VIT D DEFICIENCY, FRACTURES): Vit D, 25-Hydroxy: 60.8 ng/mL (ref 30.0–100.0)

## 2017-01-12 LAB — SPECIMEN STATUS REPORT

## 2017-01-12 LAB — TSH: TSH: 1.62 u[IU]/mL (ref 0.450–4.500)

## 2017-01-18 ENCOUNTER — Other Ambulatory Visit: Payer: Self-pay | Admitting: Physician Assistant

## 2017-01-18 DIAGNOSIS — J439 Emphysema, unspecified: Secondary | ICD-10-CM

## 2017-02-09 ENCOUNTER — Telehealth: Payer: Self-pay | Admitting: Pulmonary Disease

## 2017-02-09 ENCOUNTER — Ambulatory Visit: Payer: Medicare Other | Admitting: Pulmonary Disease

## 2017-02-09 MED ORDER — PREDNISONE 20 MG PO TABS: 20.0000 mg | ORAL_TABLET | Freq: Every day | ORAL | 0 refills | Status: DC

## 2017-02-09 NOTE — Telephone Encounter (Signed)
Spoke with patient with BQ's recs. Patient verbalized understanding. Prednisone has been called in for her.

## 2017-02-09 NOTE — Telephone Encounter (Signed)
Prednisone 20mg daily x 5 days  Call if no improvement 

## 2017-02-09 NOTE — Telephone Encounter (Signed)
Patient states she has had congestion in her chest for the past few days. Productive cough with clear mucus. Denies any chest pains and a slight increase in SOB.  She has been taking Mucinex DM twice a day and it is not helping. She said that she had the same symptoms years ago and remembers taking prednisone.   [facility] to use [facility] [address].   BQ, please advise.

## 2017-02-11 ENCOUNTER — Ambulatory Visit (INDEPENDENT_AMBULATORY_CARE_PROVIDER_SITE_OTHER): Payer: Medicare Other | Admitting: Physician Assistant

## 2017-02-11 ENCOUNTER — Encounter: Payer: Self-pay | Admitting: Physician Assistant

## 2017-02-11 VITALS — HR 90 | Temp 98.0°F | Resp 16 | Ht 67.52 in | Wt 164.0 lb

## 2017-02-11 DIAGNOSIS — K047 Periapical abscess without sinus: Secondary | ICD-10-CM

## 2017-02-11 DIAGNOSIS — S025XXB Fracture of tooth (traumatic), initial encounter for open fracture: Secondary | ICD-10-CM

## 2017-02-11 DIAGNOSIS — K0889 Other specified disorders of teeth and supporting structures: Secondary | ICD-10-CM | POA: Diagnosis not present

## 2017-02-11 MED ORDER — CLINDAMYCIN HCL 300 MG PO CAPS: 300.0000 mg | ORAL_CAPSULE | Freq: Three times a day (TID) | ORAL | 0 refills | Status: DC

## 2017-02-11 MED ORDER — HYDROCODONE-ACETAMINOPHEN 5-325 MG PO TABS: 1.0000 | ORAL_TABLET | Freq: Four times a day (QID) | ORAL | 0 refills | Status: DC | PRN

## 2017-02-11 NOTE — Patient Instructions (Signed)
     IF you received an x-ray today, you will receive an invoice from [facility]. Please contact [facility] at [phone] with questions or concerns regarding your invoice.   IF you received labwork today, you will receive an invoice from [facility]. Please contact [facility] at [phone] with questions or concerns regarding your invoice.   Our billing staff will not be able to assist you with questions regarding bills from these companies.  You will be contacted with the lab results as soon as they are available. The fastest way to get your results is to activate your My Chart account. Instructions are located on the last page of this paperwork. If you have not heard from us regarding the results in 2 weeks, please contact this office.     

## 2017-02-11 NOTE — Progress Notes (Signed)
Osie CheeksJo Ann D [place]  MRN: [id number] DOB: 1941/05/09  PCP: Morrell RiddleWeber, Koralyn Prestage L, PA-C  Chief Complaint  Patient presents with  . Dental Pain    broke a tooth in the front on the left side now having pain and swelling     Subjective:  Pt presents to clinic for broken tooth left lower -- swollen and painful and she just does not feel good.  No F/C.  She has had some problems with her breathing recently and saw her pulmonologist and was started on prednisone yesterday and feels better - she has been wearing her oxygen all the time the last several days.   Started lexapro - 5mg  felt terrible on it - tremors - at the time lots of stress - is willing to try it again during times of no stress  Review of Systems  Patient Active Problem List   Diagnosis Date Noted  . Moderate episode of recurrent major depressive disorder (HCC) 01/04/2017  . Daytime somnolence 06/07/2016  . Chronic respiratory failure with hypoxia (HCC) 06/07/2016  . History of rheumatic fever 01/27/2016  . Vitamin D deficiency 01/27/2016  . Dyspnea 06/26/2015  . History of tobacco use 05/09/2013  . [facility]  . History of hypothyroidism 09/30/2010  . Anxiety 09/30/2010  . HEMORRHOIDS 09/30/2010  . COPD (chronic obstructive pulmonary disease) with emphysema (HCC) 09/30/2010  . Osteoporosis 09/30/2010    Current Outpatient Prescriptions on File Prior to Visit  Medication Sig Dispense Refill  . acidophilus (RISAQUAD) CAPS capsule Take 1 capsule by mouth daily.    Marland Kitchen. alendronate (FOSAMAX) 70 MG tablet Take 1 tablet (70 mg total) by mouth every 7 (seven) days. Take with a full glass of water on an empty stomach. 12 tablet 3  . ALPRAZolam (XANAX) 0.5 MG tablet Take 1 tablet (0.5 mg total) by mouth at bedtime as needed. 30 tablet 0  . budesonide-formoterol (SYMBICORT) 160-4.5 MCG/ACT inhaler INHALE 2 PUFFS INTO THE LUNGS TWICE A DAY 30.6 Inhaler 3  . Calcium Carbonate (CALCIUM 500 PO) Take 500 Units by mouth 3 (three)  times daily.    . Cholecalciferol (VITAMIN D) 2000 UNITS tablet Take 6,000 Units by mouth daily.     . Cranberry 300 MG tablet Take 300 mg by mouth 2 (two) times daily.    Marland Kitchen. dextromethorphan-guaiFENesin (MUCINEX DM) 30-600 MG 12hr tablet Take 1 tablet by mouth 2 (two) times daily.    Marland Kitchen. escitalopram (LEXAPRO) 10 MG tablet Take 1 tablet (10 mg total) by mouth daily. 30 tablet 1  . ipratropium-albuterol (DUONEB) 0.5-2.5 (3) MG/3ML SOLN 1 vial tid 270 mL 11  . predniSONE (DELTASONE) 20 MG tablet Take 1 tablet (20 mg total) by mouth daily with breakfast. 5 tablet 0  . PROAIR HFA 108 (90 Base) MCG/ACT inhaler Inhale 1-2 puffs into the lungs every 6 (six) hours as needed for wheezing or shortness of breath. 54 g 3  . PROAIR HFA 108 (90 Base) MCG/ACT inhaler INHALE 1-2 PUFFS INTO THE LUNGS EVERY 6 (SIX) HOURS AS NEEDED FOR WHEEZING OR SHORTNESS OF BREATH. 8.5 Inhaler 5  . Respiratory Therapy Supplies (FLUTTER) DEVI Use as directed 1 each 0  . Spacer/Aero-Holding Chambers (AEROCHAMBER MV) inhaler Use as instructed 1 each 0  . tiotropium (SPIRIVA HANDIHALER) 18 MCG inhalation capsule Place 1 capsule (18 mcg total) into inhaler and inhale daily. 90 capsule 3   No current facility-administered medications on file prior to visit.     Allergies  Allergen Reactions  . Celebrex [  Celecoxib]     Tendonitis  . Levaquin [Levofloxacin]     Tendonitis   . Penicillins Other (See Comments)    psorasis    Pt patients past, family and social history were reviewed and updated.   Objective:  Pulse 90   Temp 98 F (36.7 C) (Oral)   Resp 16   Ht 5' 7.52" (1.715 m)   Wt 164 lb (74.4 kg)   SpO2 93%   BMI 25.29 kg/m   Physical Exam  Constitutional: She is oriented to person, place, and time and well-developed, well-nourished, and in no distress.  HENT:  Head: Normocephalic and atraumatic.  Right Ear: Hearing and external ear normal.  Left Ear: Hearing and external ear normal.  Mouth/Throat:    Eyes:  Conjunctivae are normal.  Neck: Normal range of motion.  Cardiovascular: Normal rate, regular rhythm and normal heart sounds.   No murmur heard. Pulmonary/Chest: Effort normal and breath sounds normal. She has no wheezes.  Neurological: She is alert and oriented to person, place, and time. Gait normal.  Skin: Skin is warm and dry.  Psychiatric: Mood, memory, affect and judgment normal.  Vitals reviewed.   Assessment and Plan :  Open fracture of tooth, initial encounter  Tooth pain - Plan: HYDROcodone-acetaminophen (NORCO/VICODIN) 5-325 MG tablet  Tooth abscess - Plan: clindamycin (CLEOCIN) 300 MG capsule - rinse out mouth - warm compress - abx started today and patient has appt with dentist upcoming  Pt will try lexapro again when she is not under stress to see if it was stress that caused her symptoms - f/u with me if she is unable to tolerate or in 3 weeks if she is able to tolerate  Benny Lennert PA-C  Primary Care at [facility] 02/11/2017 7:42 PM

## 2017-02-15 ENCOUNTER — Ambulatory Visit: Payer: Medicare Other | Admitting: Physician Assistant

## 2017-03-03 DIAGNOSIS — J438 Other emphysema: Secondary | ICD-10-CM | POA: Diagnosis not present

## 2017-03-08 ENCOUNTER — Ambulatory Visit (INDEPENDENT_AMBULATORY_CARE_PROVIDER_SITE_OTHER)
Admission: RE | Admit: 2017-03-08 | Discharge: 2017-03-08 | Disposition: A | Discharge status: Home / Self Care | Payer: Medicare Other | Source: Ambulatory Visit | Attending: Pulmonary Disease | Admitting: Pulmonary Disease

## 2017-03-08 ENCOUNTER — Ambulatory Visit (INDEPENDENT_AMBULATORY_CARE_PROVIDER_SITE_OTHER): Payer: Medicare Other | Admitting: Pulmonary Disease

## 2017-03-08 ENCOUNTER — Encounter: Payer: Self-pay | Admitting: Pulmonary Disease

## 2017-03-08 VITALS — BP 122/64 | HR 84 | Ht 67.0 in | Wt 159.0 lb

## 2017-03-08 DIAGNOSIS — J432 Centrilobular emphysema: Secondary | ICD-10-CM | POA: Diagnosis not present

## 2017-03-08 DIAGNOSIS — J9611 Chronic respiratory failure with hypoxia: Secondary | ICD-10-CM | POA: Diagnosis not present

## 2017-03-08 DIAGNOSIS — J449 Chronic obstructive pulmonary disease, unspecified: Secondary | ICD-10-CM | POA: Diagnosis not present

## 2017-03-08 MED ORDER — BUDESONIDE-FORMOTEROL FUMARATE 160-4.5 MCG/ACT IN AERO: 2.0000 | INHALATION_SPRAY | Freq: Two times a day (BID) | RESPIRATORY_TRACT | 0 refills | Status: DC

## 2017-03-08 NOTE — Progress Notes (Signed)
Subjective:    Patient ID: Rachel Perez, female    DOB: [birth year], 76 y.o.   MRN: [id number]  Synopsis: Former patient of Dr. Shelle Iron who has COPD.   HPI Chief Complaint  Patient presents with  . Follow-up    pt states she is stable currently.  CAT score: 20.    Rachel Perez says that she has been having a horrible year. She lost her mother an uncle this year.  Her cat attacked her last week. She has had a tooth infection and ended up having a COPD exacerbation for which we needed to call in some predinsone. She has been walking about 5 minutes at a time.  She says that the hot weather has really made her worse.   She has been trying to sleep with oxygen but it didn't help.    She has been taking Symbicort and Spiriva regularly.  She doesn't have much chest congestion right now.    Past Medical History:  Diagnosis Date  . Anxiety   . Arthritis   . Cataract   . COPD (chronic obstructive pulmonary disease) (HCC)   . Depression   . Emphysema of lung (HCC)   . H/O bladder infections   . Osteoporosis   . Thyroid disease       Review of Systems  Constitutional: Positive for fatigue. Negative for chills and fever.  HENT: Negative for postnasal drip, rhinorrhea and sinus pressure.   Respiratory: Positive for cough and shortness of breath. Negative for wheezing.   Cardiovascular: Negative for chest pain, palpitations and leg swelling.       Objective:   Physical Exam Vitals:   03/08/17 1351  BP: 122/64  Pulse: 84  SpO2: 95%  Weight: 159 lb (72.1 kg)  Height: 5\' 7"  (1.702 m)  RA  Gen: chronically ill appearing HENT: OP clear, TM's clear, neck supple PULM: poor air movement, slight upper airway wheeze B, normal percussion CV: RRR, no mgr, trace edema GI: BS+, soft, nontender Derm: no cyanosis or rash Psyche: normal mood and affect  PFT PFT's 11/2010:  FEV1 0.76, ratio 32, 47% improvement with BD, no restriction, +airtrapping, DLCO 53%.  November 2016 pulmonary  function testing ratio 31%, FEV1 0.75 L (29% predicted, 12% change but not significant), total lung capacity 10.56 L (192% predicted, residual volume 8.54 L (358% predicted), DLCO 8.62 (30% predicted)  Sleep study: Sleep study in November 2017 showed nocturnal hypoxemia (82% on 2L) but no evidence of obstructive sleep apnea.  CBC    Component Value Date/Time   WBC 8.4 01/27/2016 1435   RBC 4.66 01/27/2016 1435   HGB 14.1 01/27/2016 1435   HCT 42.6 01/27/2016 1435   PLT 268 01/27/2016 1435   MCV 91.4 01/27/2016 1435   MCH 30.3 01/27/2016 1435   MCHC 33.1 01/27/2016 1435   RDW 14.3 01/27/2016 1435   LYMPHSABS 1,428 01/27/2016 1435   MONOABS 756 01/27/2016 1435   EOSABS 252 01/27/2016 1435   BASOSABS 84 01/27/2016 1435         Assessment & Plan:  Centrilobular emphysema (HCC) - Plan: DG Chest 2 View  Chronic respiratory failure with hypoxia (HCC)   Discussion: This is been a relatively stable interval. She's been exercising and losing weight. She did have one mild exacerbation recently. She's not had a chest x-ray in a few years. Given the recent exacerbation of think it's worthwhile to get another x-ray to make sure there's nothing else going on. She will continue  using oxygen at night.  She may benefit from changing from Symbicort and Spiriva to Trelegy, but it's not clear if her insurance will cover this.  Plan: For your COPD: Check with your insurance company to see if they cover Trelegy Otherwise keep taking Symbicort and Spiriva We will get a chest x-ray and call you with the results  For your nocturnal hypoxemia: Keep using oxygen at night  We will see you back in 3-4 months or sooner if needed

## 2017-03-08 NOTE — Patient Instructions (Addendum)
For your COPD: Check with your insurance company to see if they cover Trelegy Otherwise keep taking Symbicort and Spiriva We will get a chest x-ray and call you with the results  For your nocturnal hypoxemia: Keep using oxygen at night  We will see you back in 3-4 months or sooner if needed

## 2017-03-08 NOTE — Addendum Note (Signed)
Addended by: Velvet BatheAULFIELD, ASHLEY L on: 03/08/2017 02:28 PM   Modules accepted: Orders

## 2017-03-09 ENCOUNTER — Telehealth: Payer: Self-pay | Admitting: Pulmonary Disease

## 2017-03-09 NOTE — Telephone Encounter (Signed)
Notes recorded by Lupita LeashMcQuaid, Douglas B, MD on 03/09/2017 at 12:46 PM EDT A, Please let the patient know this was OK Thanks, B ---------- Spoke with pt, aware of results/recs.  Nothing further needed.

## 2017-03-24 ENCOUNTER — Other Ambulatory Visit: Payer: Self-pay | Admitting: Physician Assistant

## 2017-03-24 DIAGNOSIS — J439 Emphysema, unspecified: Secondary | ICD-10-CM

## 2017-03-26 NOTE — Telephone Encounter (Signed)
Please advise 

## 2017-04-02 DIAGNOSIS — J438 Other emphysema: Secondary | ICD-10-CM | POA: Diagnosis not present

## 2017-04-05 ENCOUNTER — Other Ambulatory Visit: Payer: Self-pay | Admitting: Physician Assistant

## 2017-04-05 DIAGNOSIS — M810 Age-related osteoporosis without current pathological fracture: Secondary | ICD-10-CM

## 2017-04-08 NOTE — Telephone Encounter (Signed)
Courtesy refill provided in PA-Weber's absence. Patient should f/u with her PA-Weber.

## 2017-04-11 NOTE — Telephone Encounter (Signed)
MyChart message sent to pt about making an apt for refills

## 2017-05-03 DIAGNOSIS — J438 Other emphysema: Secondary | ICD-10-CM | POA: Diagnosis not present

## 2017-06-03 DIAGNOSIS — J438 Other emphysema: Secondary | ICD-10-CM | POA: Diagnosis not present

## 2017-06-08 ENCOUNTER — Ambulatory Visit: Payer: Medicare Other | Admitting: Pulmonary Disease

## 2017-06-14 ENCOUNTER — Encounter: Payer: Self-pay | Admitting: Adult Health

## 2017-06-14 ENCOUNTER — Ambulatory Visit (INDEPENDENT_AMBULATORY_CARE_PROVIDER_SITE_OTHER): Payer: Medicare Other | Admitting: Adult Health

## 2017-06-14 DIAGNOSIS — J432 Centrilobular emphysema: Secondary | ICD-10-CM

## 2017-06-14 DIAGNOSIS — Z23 Encounter for immunization: Secondary | ICD-10-CM | POA: Diagnosis not present

## 2017-06-14 DIAGNOSIS — J9611 Chronic respiratory failure with hypoxia: Secondary | ICD-10-CM | POA: Diagnosis not present

## 2017-06-14 MED ORDER — BUDESONIDE-FORMOTEROL FUMARATE 160-4.5 MCG/ACT IN AERO: 2.0000 | INHALATION_SPRAY | Freq: Two times a day (BID) | RESPIRATORY_TRACT | 0 refills | Status: DC

## 2017-06-14 NOTE — Patient Instructions (Addendum)
Continue on Symbicort and Spiriva  Continue on Oxygen 2l/m At bedtime  .  Follow up with Dr. Kendrick Fries 4 months and As needed   Flu shot today .

## 2017-06-14 NOTE — Assessment & Plan Note (Signed)
Cont on O2 .At bedtime   With act to keep sats >88%.

## 2017-06-14 NOTE — Assessment & Plan Note (Signed)
Controlled on current regimen  Flu shot today   Plan Patient Instructions  Continue on Symbicort and Spiriva  Continue on Oxygen 2l/m At bedtime  .  Follow up with Dr. Kendrick Fries 4 months and As needed   Flu shot today .

## 2017-06-14 NOTE — Progress Notes (Signed)
Reviewed, agree 

## 2017-06-14 NOTE — Progress Notes (Signed)
  ID: Rachel Perez, female    DOB: 1941/06/03, 76 y.o.   MRN: [id number]  Chief Complaint  Patient presents with  . Follow-up    COPD    Referring provider: Larkin Ina  HPI: 76 yo female former smoker followed for COPD and O2 RF At bedtime  .   TEST  PFT PFT's 11/2010:  FEV1 0.76, ratio 32, 47% improvement with BD, no restriction, +airtrapping, DLCO 53%.  November 2016 pulmonary function testing ratio 31%, FEV1 0.75 L (29% predicted, 12% change but not significant), total lung capacity 10.56 L (192% predicted, residual volume 8.54 L (358% predicted), DLCO 8.62 (30% predicted)  Sleep study: Sleep study in November 2017 showed nocturnal hypoxemia (82% on 2L) but no evidence of obstructive sleep apnea.  06/14/2017 Follow up : COPD and O2 RF.  Pt returns for 3 month follow up . Says overall she is doing okay . Does get winded with walking long distance. No flare of cough or wheezing .  Has had a tough year, lost mother this year. Is looking forward to cooler temps . 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  She remains on Symbicort and Spiriva .  Remains on Oxygen 2l/m  At bedtime   Wants flu shot today .  Going to [place] for Thanksgiving . She is very excited.    Allergies  Allergen Reactions  . Celebrex [Celecoxib]     Tendonitis  . Levaquin [Levofloxacin]     Tendonitis   . Penicillins Other (See Comments)    psorasis    Immunization History  Administered Date(s) Administered  . Influenza Split 11/03/2011, 06/21/2013  . Influenza, High Dose Seasonal PF 06/07/2016  . Influenza,inj,Quad PF,6+ Mos 10/15/2014, 06/26/2015  . Pneumococcal Conjugate-13 10/15/2014  . Pneumococcal Polysaccharide-23 09/10/2009  . Td 09/10/2009  . Zoster 01/22/2010    Past Medical History:  Diagnosis Date  . Anxiety   . Arthritis   . Cataract   . COPD (chronic obstructive pulmonary disease) (HCC)   . Depression   . Emphysema of lung (HCC)   . H/O bladder  infections   . Osteoporosis   . Thyroid disease     Tobacco History: History  Smoking Status  . Former Smoker  . Packs/day: 0.50  . Years: 40.00  . Types: Cigarettes  . Quit date: 03/30/2013  Smokeless Tobacco  . Never Used    Comment: QUIT 03/30/13   Counseling given: Not Answered   Outpatient Encounter Prescriptions as of 06/14/2017  Medication Sig  . acidophilus (RISAQUAD) CAPS capsule Take 1 capsule by mouth daily.  Marland Kitchen alendronate (FOSAMAX) 70 MG tablet TAKE 1 TABLET BY MOUTH EVERY 7 DAYS WITH A FULL GLASS OF WATER ON AN EMPTY STOMACH  . ALPRAZolam (XANAX) 0.5 MG tablet Take 1 tablet (0.5 mg total) by mouth at bedtime as needed.  . budesonide-formoterol (SYMBICORT) 160-4.5 MCG/ACT inhaler INHALE 2 PUFFS INTO THE LUNGS TWICE A DAY  . Calcium Carbonate (CALCIUM 500 PO) Take 500 Units by mouth 3 (three) times daily.  . Cholecalciferol (VITAMIN D) 2000 UNITS tablet Take 6,000 Units by mouth daily.   . Cranberry 300 MG tablet Take 300 mg by mouth 2 (two) times daily.  Marland Kitchen dextromethorphan-guaiFENesin (MUCINEX DM) 30-600 MG 12hr tablet Take 1 tablet by mouth 2 (two) times daily.  Marland Kitchen HYDROcodone-acetaminophen (NORCO/VICODIN) 5-325 MG tablet Take 1 tablet by mouth every 6 (six) hours as needed for moderate pain.  Marland Kitchen ipratropium-albuterol (DUONEB) 0.5-2.5 (3) MG/3ML  SOLN 1 vial tid  . PROAIR HFA 108 (90 Base) MCG/ACT inhaler INHALE 1-2 PUFFS INTO THE LUNGS EVERY 6 (SIX) HOURS AS NEEDED FOR WHEEZING OR SHORTNESS OF BREATH.  Marland Kitchen PROAIR HFA 108 (90 Base) MCG/ACT inhaler INHALE 1-2 PUFFS INTO THE LUNGS EVERY 6 HOURS AS NEEDED FOR SHORTNESS OF BREATH OR WHEEZING  . Respiratory Therapy Supplies (FLUTTER) DEVI Use as directed  . Spacer/Aero-Holding Chambers (AEROCHAMBER MV) inhaler Use as instructed  . tiotropium (SPIRIVA HANDIHALER) 18 MCG inhalation capsule Place 1 capsule (18 mcg total) into inhaler and inhale daily.  . [DISCONTINUED] budesonide-formoterol (SYMBICORT) 160-4.5 MCG/ACT inhaler Inhale  2 puffs into the lungs 2 (two) times daily.   No facility-administered encounter medications on file as of 06/14/2017.      Review of Systems  Constitutional:   No  weight loss, night sweats,  Fevers, chills, fatigue, or  lassitude.  HEENT:   No headaches,  Difficulty swallowing,  Tooth/dental problems, or  Sore throat,                No sneezing, itching, ear ache, nasal congestion, post nasal drip,   CV:  No chest pain,  Orthopnea, PND, swelling in lower extremities, anasarca, dizziness, palpitations, syncope.   GI  No heartburn, indigestion, abdominal pain, nausea, vomiting, diarrhea, change in bowel habits, loss of appetite, bloody stools.   Resp: No shortness of breath with exertion or at rest.  No excess mucus, no productive cough,  No non-productive cough,  No coughing up of blood.  No change in color of mucus.  No wheezing.  No chest wall deformity  Skin: no rash or lesions.  GU: no dysuria, change in color of urine, no urgency or frequency.  No flank pain, no hematuria   MS:  No joint pain or swelling.  No decreased range of motion.  No back pain.    Physical Exam  BP 114/68 (BP Location: Left Arm, Cuff Size: Normal)   Pulse 86   Ht  (1.702 m)   Wt 162 lb 3.2 oz (73.6 kg)   SpO2 92%   BMI 25.40 kg/m   GEN: A/Ox3; pleasant , NAD,  Elderly    HEENT:  [place]/AT,  EACs-clear, TMs-wnl, NOSE-clear, THROAT-clear, no lesions, no postnasal drip or exudate noted.   NECK:  Supple w/ fair ROM; no JVD; normal carotid impulses w/o bruits; no thyromegaly or nodules palpated; no lymphadenopathy.    RESP  Decreased BS in bases , . no accessory muscle use, no dullness to percussion  CARD:  RRR, no m/r/g, no peripheral edema, pulses intact, no cyanosis or clubbing.  GI:   Soft & nt; nml bowel sounds; no organomegaly or masses detected.   Musco: Warm bil, no deformities or joint swelling noted.   Neuro: alert, no focal deficits noted.    Skin: Warm, no lesions or  rashes    Lab Results:  CBC   BNP No results found for: BNP  ProBNP No results found for: PROBNP  Imaging: No results found.   Assessment & Plan:   No problem-specific Assessment & Plan notes found for this encounter.     Rubye Oaks, NP 06/14/2017

## 2017-06-21 ENCOUNTER — Other Ambulatory Visit: Payer: Self-pay | Admitting: Physician Assistant

## 2017-06-21 DIAGNOSIS — J439 Emphysema, unspecified: Secondary | ICD-10-CM

## 2017-06-21 NOTE — Telephone Encounter (Signed)
Please advise/refill for Albuterol inhaler

## 2017-06-21 NOTE — Telephone Encounter (Signed)
Per Michelle Nasuti please advise.

## 2017-06-29 DIAGNOSIS — H2513 Age-related nuclear cataract, bilateral: Secondary | ICD-10-CM | POA: Diagnosis not present

## 2017-06-29 DIAGNOSIS — H5213 Myopia, bilateral: Secondary | ICD-10-CM | POA: Diagnosis not present

## 2017-06-29 DIAGNOSIS — H524 Presbyopia: Secondary | ICD-10-CM | POA: Diagnosis not present

## 2017-07-03 DIAGNOSIS — J438 Other emphysema: Secondary | ICD-10-CM | POA: Diagnosis not present

## 2017-07-21 ENCOUNTER — Other Ambulatory Visit: Payer: Self-pay | Admitting: Physician Assistant

## 2017-07-21 DIAGNOSIS — J439 Emphysema, unspecified: Secondary | ICD-10-CM

## 2017-07-29 ENCOUNTER — Other Ambulatory Visit: Payer: Self-pay | Admitting: Physician Assistant

## 2017-07-29 ENCOUNTER — Telehealth: Payer: Self-pay | Admitting: Adult Health

## 2017-07-29 DIAGNOSIS — J439 Emphysema, unspecified: Secondary | ICD-10-CM

## 2017-07-29 NOTE — Telephone Encounter (Signed)
Patient has never received ProAir from us. Per patient's chart, she has made an appt with her PCP tomorrow for the refill.

## 2017-07-29 NOTE — Telephone Encounter (Signed)
Appointment made, criteria for refill on inhaler.

## 2017-07-30 ENCOUNTER — Encounter: Payer: Self-pay | Admitting: Physician Assistant

## 2017-07-30 ENCOUNTER — Ambulatory Visit: Payer: Medicare Other | Admitting: Physician Assistant

## 2017-07-30 DIAGNOSIS — J439 Emphysema, unspecified: Secondary | ICD-10-CM | POA: Diagnosis not present

## 2017-07-30 MED ORDER — PROAIR HFA 108 (90 BASE) MCG/ACT IN AERS: 1.0000 | INHALATION_SPRAY | Freq: Four times a day (QID) | RESPIRATORY_TRACT | 5 refills | Status: DC | PRN

## 2017-07-30 NOTE — Patient Instructions (Signed)
I have given you enough refills to last you for the next 6 months.  Please follow-up with Maralyn SagoSarah as planned.  Enjoy your trip! Thank you for letting me participate in your health and well being.

## 2017-07-30 NOTE — Progress Notes (Signed)
Rachel CheeksJo Ann D Loy  MRN: [id number] DOB: May 03, 1941  Subjective:  Rachel CheeksJo Ann D Perez is a 76 y.o. female seen in office today for a chief complaint of medication refill for proair. Has hx of COPD. She is a former smoker. Controlled on symbicort 160-4.5 mcg/act, spiriva 18 mcg daily, duonebs, and proair as needed.  She also has portable oxygen.  Notes she goes some days without using the pro-air and then other days she has to use it 4-5 times.  She was followed by Dr. Kendrick FriesMcQuaid, pulmonology. Follows up with him in 10/2016.  She is followed here by PA Weber, who manages her COPD medication.  She could not get in with PA Weber today.She is about to go on a trip and does not want to run out of her medication.   Notes she feels very well today. Denies shortness of breath, wheezing,cough, fever, chills, and chest pain.Has no other questions or concerns.  Review of Systems  Per HPI  Patient Active Problem List   Diagnosis Date Noted  . Moderate episode of recurrent major depressive disorder (HCC) 01/04/2017  . Daytime somnolence 06/07/2016  . Chronic respiratory failure with hypoxia (HCC) 06/07/2016  . History of rheumatic fever 01/27/2016  . Vitamin D deficiency 01/27/2016  . Dyspnea 06/26/2015  . History of tobacco use 05/09/2013  . [facility]  . History of hypothyroidism 09/30/2010  . Anxiety 09/30/2010  . HEMORRHOIDS 09/30/2010  . COPD (chronic obstructive pulmonary disease) with emphysema (HCC) 09/30/2010  . Osteoporosis 09/30/2010    Current Outpatient Medications on File Prior to Visit  Medication Sig Dispense Refill  . acidophilus (RISAQUAD) CAPS capsule Take 1 capsule by mouth daily.    Marland Kitchen. alendronate (FOSAMAX) 70 MG tablet TAKE 1 TABLET BY MOUTH EVERY 7 DAYS WITH A FULL GLASS OF WATER ON AN EMPTY STOMACH 4 tablet 0  . ALPRAZolam (XANAX) 0.5 MG tablet Take 1 tablet (0.5 mg total) by mouth at bedtime as needed. 30 tablet 0  . budesonide-formoterol (SYMBICORT) 160-4.5 MCG/ACT  inhaler Inhale 2 puffs into the lungs 2 (two) times daily. 4 Inhaler 0  . Calcium Carbonate (CALCIUM 500 PO) Take 500 Units by mouth 3 (three) times daily.    . Cholecalciferol (VITAMIN D) 2000 UNITS tablet Take 6,000 Units by mouth daily.     . Cranberry 300 MG tablet Take 300 mg by mouth 2 (two) times daily.    Marland Kitchen. dextromethorphan-guaiFENesin (MUCINEX DM) 30-600 MG 12hr tablet Take 1 tablet by mouth 2 (two) times daily.    Marland Kitchen. HYDROcodone-acetaminophen (NORCO/VICODIN) 5-325 MG tablet Take 1 tablet by mouth every 6 (six) hours as needed for moderate pain. 20 tablet 0  . ipratropium-albuterol (DUONEB) 0.5-2.5 (3) MG/3ML SOLN 1 vial tid 270 mL 11  . Respiratory Therapy Supplies (FLUTTER) DEVI Use as directed 1 each 0  . Spacer/Aero-Holding Chambers (AEROCHAMBER MV) inhaler Use as instructed 1 each 0  . tiotropium (SPIRIVA HANDIHALER) 18 MCG inhalation capsule Place 1 capsule (18 mcg total) into inhaler and inhale daily. 90 capsule 3   No current facility-administered medications on file prior to visit.     Allergies  Allergen Reactions  . Celebrex [Celecoxib]     Tendonitis  . Levaquin [Levofloxacin]     Tendonitis   . Penicillins Other (See Comments)    psorasis     Objective:  BP (!) 104/56   Pulse 87   Resp 16   Ht 5' 7.5" (1.715 m)   Wt 165 lb 3.2 oz (  74.9 kg)   SpO2 95%   BMI 25.49 kg/m   Physical Exam  Constitutional: She is oriented to person, place, and time and well-developed, well-nourished, and in no distress.  HENT:  Head: Normocephalic and atraumatic.  Eyes: Conjunctivae are normal.  Neck: Normal range of motion.  Pulmonary/Chest: Effort normal. She has decreased breath sounds. She has wheezes (few intermittent faint wheezes noted bilaterally ). She has no rhonchi. She has no rales.  Neurological: She is alert and oriented to person, place, and time. Gait normal.  Skin: Skin is warm and dry.  Psychiatric: Affect normal.    Assessment and Plan :  1. Pulmonary  emphysema, unspecified emphysema type (HCC) Patient is well-appearing and stable today.  She has decreased breath sounds and few intermittent wheezes noted on exam, which appears to be her baseline.  Refills given.  Follow-up with pulmonology PA Weber as planned. - PROAIR HFA 108 (90 Base) MCG/ACT inhaler; Inhale 1-2 puffs every 6 (six) hours as needed into the lungs for wheezing or shortness of breath.  Dispense: 17 Inhaler; Refill: 5  Benjiman CoreBrittany Kiarra Kidd PA-C  [facility]  [facility] 07/30/2017 2:09 PM

## 2017-08-03 DIAGNOSIS — J438 Other emphysema: Secondary | ICD-10-CM | POA: Diagnosis not present

## 2017-09-02 DIAGNOSIS — J438 Other emphysema: Secondary | ICD-10-CM | POA: Diagnosis not present

## 2017-09-20 DIAGNOSIS — H2512 Age-related nuclear cataract, left eye: Secondary | ICD-10-CM | POA: Diagnosis not present

## 2017-09-20 DIAGNOSIS — H2513 Age-related nuclear cataract, bilateral: Secondary | ICD-10-CM | POA: Diagnosis not present

## 2017-10-03 DIAGNOSIS — J438 Other emphysema: Secondary | ICD-10-CM | POA: Diagnosis not present

## 2017-10-26 ENCOUNTER — Ambulatory Visit (INDEPENDENT_AMBULATORY_CARE_PROVIDER_SITE_OTHER)
Admission: RE | Admit: 2017-10-26 | Discharge: 2017-10-26 | Disposition: A | Discharge status: Home / Self Care | Payer: Medicare Other | Source: Ambulatory Visit | Attending: Pulmonary Disease | Admitting: Pulmonary Disease

## 2017-10-26 ENCOUNTER — Ambulatory Visit: Payer: Medicare Other | Admitting: Pulmonary Disease

## 2017-10-26 ENCOUNTER — Encounter: Payer: Self-pay | Admitting: Pulmonary Disease

## 2017-10-26 VITALS — BP 138/72 | HR 79 | Ht 67.5 in | Wt 165.0 lb

## 2017-10-26 DIAGNOSIS — R062 Wheezing: Secondary | ICD-10-CM | POA: Diagnosis not present

## 2017-10-26 DIAGNOSIS — J432 Centrilobular emphysema: Secondary | ICD-10-CM

## 2017-10-26 DIAGNOSIS — J9611 Chronic respiratory failure with hypoxia: Secondary | ICD-10-CM | POA: Diagnosis not present

## 2017-10-26 NOTE — Patient Instructions (Addendum)
Severe COPD: Continue Symbicort and Spiriva as you are doing Use the duoneb as needed for shortness of breath From my standpoint it is OK for you to have the cataract surgery: make sure you take the symbicort and spiriva on the day of the procedure  Deconditioning:  I think that you need to exercise more frequenlty  For nocturnal hypoxemia; Keep using oxygen in the evenings  Chronic respiratory failure with hypoxemia: Continue using 2L O2 when you exert yourself  Focal wheeze heard on lung exam: CXR 2 view today  Follow up in 4-6 months or sooner if needed

## 2017-10-26 NOTE — Progress Notes (Signed)
Subjective:    Patient ID: Rachel Perez, female    DOB: [birth year], 77 y.o.   MRN: [id number]  Synopsis: Former patient of Dr. Shelle Iron who has COPD.   HPI Chief Complaint  Patient presents with  . Follow-up    pt states she has been doing poorly during winter, states she has increased sob with cold weather.     Rachel Perez went to [facility] and [place] over the holidays and had a nice time. She has cataract surgery next week.  She isn't sure if she can have this or not.  She has not had a flare of her COPD lately.  No recent prednisone or antibiotics. She has been trying to exercise a little more but the winter has been tough on her.  She was trying to do 5-10 minutes a day but even this was hard now that the weather has been bad.  She has not been outside as much lately.   She has tried to do a little resistance training lately.  She found herself a little more depressed over the winter.      Past Medical History:  Diagnosis Date  . Anxiety   . Arthritis   . Cataract   . COPD (chronic obstructive pulmonary disease) (HCC)   . Depression   . Emphysema of lung (HCC)   . H/O bladder infections   . Osteoporosis   . Thyroid disease       Review of Systems  Constitutional: Positive for fatigue. Negative for chills and fever.  HENT: Negative for postnasal drip, rhinorrhea and sinus pressure.   Respiratory: Positive for cough and shortness of breath. Negative for wheezing.   Cardiovascular: Negative for chest pain, palpitations and leg swelling.       Objective:   Physical Exam Vitals:   10/26/17 1346  BP: 138/72  Pulse: 79  SpO2: 93%  Weight: 165 lb (74.8 kg)  Height: 5' 7.5" (1.715 m)  RA  Gen: well appearing HENT: OP clear, TM's clear, neck supple PULM: Wheeze left lower lobe B, normal percussion CV: RRR, no mgr, trace edema GI: BS+, soft, nontender Derm: no cyanosis or rash Psyche: normal mood and affect   PFT PFT's 11/2010:  FEV1 0.76, ratio 32, 47%  improvement with BD, no restriction, +airtrapping, DLCO 53%.  November 2016 pulmonary function testing ratio 31%, FEV1 0.75 L (29% predicted, 12% change but not significant), total lung capacity 10.56 L (192% predicted, residual volume 8.54 L (358% predicted), DLCO 8.62 (30% predicted)  Sleep study: Sleep study in November 2017 showed nocturnal hypoxemia (82% on 2L) but no evidence of obstructive sleep apnea.  CBC    Component Value Date/Time   WBC 8.4 01/27/2016 1435   RBC 4.66 01/27/2016 1435   HGB 14.1 01/27/2016 1435   HCT 42.6 01/27/2016 1435   PLT 268 01/27/2016 1435   MCV 91.4 01/27/2016 1435   MCH 30.3 01/27/2016 1435   MCHC 33.1 01/27/2016 1435   RDW 14.3 01/27/2016 1435   LYMPHSABS 1,428 01/27/2016 1435   MONOABS 756 01/27/2016 1435   EOSABS 252 01/27/2016 1435   BASOSABS 84 01/27/2016 1435         Assessment & Plan:   Centrilobular emphysema (HCC)  Chronic respiratory failure with hypoxia (HCC)  Inspiratory wheeze on examination  Discussion: This has been a stable interval for Rachel Perez. She has not had an exacerbation of her COPD since the last visit.  She needs to exercise more.  Cataract surgery  would be low risk for her even though she is high risk for general anesthesia.    Plan: Severe COPD: Continue Symbicort and Spiriva as you are doing Use the duoneb as needed for shortness of breath From my standpoint it is OK for you to have the cataract surgery: make sure you take the symbicort and spiriva on the day of the procedure  Deconditioning:  I think that you need to exercise more frequenlty  For nocturnal hypoxemia; Keep using oxygen in the evenings  Chronic respiratory failure with hypoxemia: Continue using 2L O2 when you exert yourself  Focal wheeze heard on lung exam: CXR 2 view today  Follow up in 4-6 months or sooner if needed  Current Outpatient Medications:  .  acidophilus (RISAQUAD) CAPS capsule, Take 1 capsule by mouth daily., Disp: ,  Rfl:  .  alendronate (FOSAMAX) 70 MG tablet, TAKE 1 TABLET BY MOUTH EVERY 7 DAYS WITH A FULL GLASS OF WATER ON AN EMPTY STOMACH, Disp: 4 tablet, Rfl: 0 .  ALPRAZolam (XANAX) 0.5 MG tablet, Take 1 tablet (0.5 mg total) by mouth at bedtime as needed., Disp: 30 tablet, Rfl: 0 .  budesonide-formoterol (SYMBICORT) 160-4.5 MCG/ACT inhaler, Inhale 2 puffs into the lungs 2 (two) times daily., Disp: 4 Inhaler, Rfl: 0 .  Calcium Carbonate (CALCIUM 500 PO), Take 500 Units by mouth 3 (three) times daily., Disp: , Rfl:  .  Cholecalciferol (VITAMIN D) 2000 UNITS tablet, Take 6,000 Units by mouth daily. , Disp: , Rfl:  .  Cranberry 300 MG tablet, Take 300 mg by mouth 2 (two) times daily., Disp: , Rfl:  .  dextromethorphan-guaiFENesin (MUCINEX DM) 30-600 MG 12hr tablet, Take 1 tablet by mouth 2 (two) times daily., Disp: , Rfl:  .  HYDROcodone-acetaminophen (NORCO/VICODIN) 5-325 MG tablet, Take 1 tablet by mouth every 6 (six) hours as needed for moderate pain., Disp: 20 tablet, Rfl: 0 .  ipratropium-albuterol (DUONEB) 0.5-2.5 (3) MG/3ML SOLN, 1 vial tid, Disp: 270 mL, Rfl: 11 .  PROAIR HFA 108 (90 Base) MCG/ACT inhaler, Inhale 1-2 puffs every 6 (six) hours as needed into the lungs for wheezing or shortness of breath., Disp: 17 Inhaler, Rfl: 5 .  Respiratory Therapy Supplies (FLUTTER) DEVI, Use as directed, Disp: 1 each, Rfl: 0 .  Spacer/Aero-Holding Chambers (AEROCHAMBER MV) inhaler, Use as instructed, Disp: 1 each, Rfl: 0 .  tiotropium (SPIRIVA HANDIHALER) 18 MCG inhalation capsule, Place 1 capsule (18 mcg total) into inhaler and inhale daily., Disp: 90 capsule, Rfl: 3

## 2017-10-31 DIAGNOSIS — H2512 Age-related nuclear cataract, left eye: Secondary | ICD-10-CM | POA: Diagnosis not present

## 2017-10-31 DIAGNOSIS — Z961 Presence of intraocular lens: Secondary | ICD-10-CM | POA: Diagnosis not present

## 2017-11-01 DIAGNOSIS — H2511 Age-related nuclear cataract, right eye: Secondary | ICD-10-CM | POA: Diagnosis not present

## 2017-11-03 DIAGNOSIS — J438 Other emphysema: Secondary | ICD-10-CM | POA: Diagnosis not present

## 2017-11-14 DIAGNOSIS — H2511 Age-related nuclear cataract, right eye: Secondary | ICD-10-CM | POA: Diagnosis not present

## 2017-12-01 DIAGNOSIS — J438 Other emphysema: Secondary | ICD-10-CM | POA: Diagnosis not present

## 2018-01-01 DIAGNOSIS — J438 Other emphysema: Secondary | ICD-10-CM | POA: Diagnosis not present

## 2018-01-10 ENCOUNTER — Encounter: Payer: Self-pay | Admitting: Physician Assistant

## 2018-01-10 ENCOUNTER — Ambulatory Visit (INDEPENDENT_AMBULATORY_CARE_PROVIDER_SITE_OTHER): Payer: Medicare Other | Admitting: Physician Assistant

## 2018-01-10 ENCOUNTER — Other Ambulatory Visit: Payer: Self-pay

## 2018-01-10 VITALS — BP 128/60 | HR 93 | Temp 98.3°F | Resp 20 | Ht 67.5 in | Wt 162.8 lb

## 2018-01-10 DIAGNOSIS — F411 Generalized anxiety disorder: Secondary | ICD-10-CM

## 2018-01-10 DIAGNOSIS — K0889 Other specified disorders of teeth and supporting structures: Secondary | ICD-10-CM

## 2018-01-10 DIAGNOSIS — Z131 Encounter for screening for diabetes mellitus: Secondary | ICD-10-CM

## 2018-01-10 DIAGNOSIS — Z8639 Personal history of other endocrine, nutritional and metabolic disease: Secondary | ICD-10-CM | POA: Diagnosis not present

## 2018-01-10 DIAGNOSIS — E2839 Other primary ovarian failure: Secondary | ICD-10-CM | POA: Diagnosis not present

## 2018-01-10 DIAGNOSIS — Z13220 Encounter for screening for lipoid disorders: Secondary | ICD-10-CM

## 2018-01-10 DIAGNOSIS — K029 Dental caries, unspecified: Secondary | ICD-10-CM

## 2018-01-10 DIAGNOSIS — M810 Age-related osteoporosis without current pathological fracture: Secondary | ICD-10-CM | POA: Diagnosis not present

## 2018-01-10 DIAGNOSIS — Z0000 Encounter for general adult medical examination without abnormal findings: Secondary | ICD-10-CM

## 2018-01-10 DIAGNOSIS — J439 Emphysema, unspecified: Secondary | ICD-10-CM

## 2018-01-10 DIAGNOSIS — F331 Major depressive disorder, recurrent, moderate: Secondary | ICD-10-CM

## 2018-01-10 MED ORDER — ALENDRONATE SODIUM 70 MG PO TABS: ORAL_TABLET | ORAL | 11 refills | Status: AC

## 2018-01-10 MED ORDER — ALPRAZOLAM 0.5 MG PO TABS: 0.5000 mg | ORAL_TABLET | Freq: Every evening | ORAL | 0 refills | Status: AC | PRN

## 2018-01-10 MED ORDER — TIOTROPIUM BROMIDE MONOHYDRATE 18 MCG IN CAPS: 18.0000 ug | ORAL_CAPSULE | Freq: Every day | RESPIRATORY_TRACT | 11 refills | Status: DC

## 2018-01-10 MED ORDER — IPRATROPIUM-ALBUTEROL 0.5-2.5 (3) MG/3ML IN SOLN: RESPIRATORY_TRACT | 11 refills | Status: DC

## 2018-01-10 MED ORDER — BUDESONIDE-FORMOTEROL FUMARATE 160-4.5 MCG/ACT IN AERO: 2.0000 | INHALATION_SPRAY | Freq: Two times a day (BID) | RESPIRATORY_TRACT | 11 refills | Status: AC

## 2018-01-10 MED ORDER — CLINDAMYCIN HCL 300 MG PO CAPS: 300.0000 mg | ORAL_CAPSULE | Freq: Three times a day (TID) | ORAL | 0 refills | Status: DC

## 2018-01-10 MED ORDER — PROAIR HFA 108 (90 BASE) MCG/ACT IN AERS: 1.0000 | INHALATION_SPRAY | Freq: Four times a day (QID) | RESPIRATORY_TRACT | 11 refills | Status: DC | PRN

## 2018-01-10 NOTE — Progress Notes (Signed)
Presents today for Medicare Visit.   Interpreter used for this visit? No  Other items to address today:   COPD - sleeping with oxygen - sees dr Waunita Schooner every 3.5 months now Pain in her upper tooth - she has appt with dentist in 2 weeks - she has known dental disease due to long term steroid inhaler Depression - stable not on medications - looking forward to her grandchildren coming home from college over the summer - she misses them - has a trip planned with her friend Osteoporosis - still takes her medication - takes Vit D daily but many times forgets her calcium  Cancer Screening: Cervical (every2 years - ages 41-65): aged out Breast (annually - ages 64-75): not sure she is going to have this year - she is not going to have treatment if she has anything so she has decided to not have this done Colon (every 10 years - ages 44-75): not a candidate due to cannot tolerate anesthesia due to severe COPD   Other screening: Bone density screening (every 2 years - ages >47): 2016 - on fosamax -plans to schedule ETOH use: rare Dental visits: has one planned in 2 weeks Vision visits: wears glasses - cataract surgeries this year  Lab Screening: Last screening for diabetes (annually): 2018 Last lipid screening (every 5 years): 2013  Activities of Daily Living In your present state of health, do you have any difficulty performing the following activities?:  Driving? no  Managing money?  No Feeding yourself? No Getting from bed to chair? No Climbing a flight of stairs? Yes - due to COPD Preparing food and eating?: No Bathing or showering? No Getting dressed: No Getting to the toilet? No Using the toilet:No Moving around from place to place: No In the past year have you fallen or had a near fall?:No   Are you sexually active?  No  Do you have more than one partner?  No  Hearing Difficulties: Yes Do you often ask people to speak up or repeat themselves?  Do you experience  ringing or noises in your ears? No Do you have difficulty understanding soft or whispered voices? Yes - plans to see audiologist for hearing aids - getting hard to be at her children's house   Do you feel that you have a problem with memory? No  Do you often misplace items? No  Do you feel safe at home?  Yes  Cognitive Testing  Alert? Yes  Normal Appearance?Yes  Oriented to person? Yes  Place? Yes   Time? Yes  Recall of three objects?  Yes  Can perform simple calculations? Yes  Displays appropriate judgment?Yes  Can read the correct time from a watch face?Yes  ADVANCE DIRECTIVES: Discussed: yes On File: no - lawyer and children have copies Materials Provided: n/a   Depression screen [facility] 2/9 01/04/2017 01/27/2016 11/04/2015 07/28/2015 03/19/2015  Decreased Interest 2 0 0 3 0  Down, Depressed, Hopeless 0 1 0 1 0  PHQ - 2 Score 2 1 0 4 0  Altered sleeping 0 - - 2 -  Tired, decreased energy 1 - - 1 -  Change in appetite 0 - - 1 -  Feeling bad or failure about yourself  1 - - 0 -  Trouble concentrating 1 - - 3 -  Moving slowly or fidgety/restless 0 - - 0 -  Suicidal thoughts 0 - - 0 -  PHQ-9 Score 5 - - 11 -  Difficult doing work/chores Not  difficult at all - - - -     Functional Status Survey: Is the patient deaf or have difficulty hearing?: Yes Does the patient have difficulty seeing, even when wearing glasses/contacts?: No Does the patient have difficulty concentrating, remembering, or making decisions?: No Does the patient have difficulty walking or climbing stairs?: No Does the patient have difficulty dressing or bathing?: No Does the patient have difficulty doing errands alone such as visiting a doctor's office or shopping?: No   Fall Risk  02/11/2017 01/04/2017 01/27/2016 07/28/2015 01/29/2015  Falls in the past year? No No No No No     Immunization status:  Immunization History  Administered Date(s) Administered  . Influenza Split 11/03/2011, 06/21/2013  . Influenza,  High Dose Seasonal PF 06/07/2016, 06/14/2017  . Influenza,inj,Quad PF,6+ Mos 10/15/2014, 06/26/2015  . Pneumococcal Conjugate-13 10/15/2014  . Pneumococcal Polysaccharide-23 09/10/2009  . Td 09/10/2009  . Zoster 01/22/2010    Health Maintenance Due  Topic Date Due  . MAMMOGRAM  02/19/2017    Patient Care Team: Mittie Bodo as PCP - General (Physician Assistant) Juanito Doom, MD as Consulting Physician (Pulmonary Disease) Parrett, Fonnie Mu, NP as Nurse Practitioner (Pulmonary Disease)   Patient Active Problem List   Diagnosis Date Noted  . Moderate episode of recurrent major depressive disorder ([place]) 01/04/2017  . Daytime somnolence 06/07/2016  . Chronic respiratory failure with hypoxia ([place]) 06/07/2016  . History of rheumatic fever 01/27/2016  . Vitamin D deficiency 01/27/2016  . Dyspnea 06/26/2015  . History of tobacco use 05/09/2013  . [facility] 10/01/2010  . History of hypothyroidism 09/30/2010  . Anxiety 09/30/2010  . HEMORRHOIDS 09/30/2010  . COPD (chronic obstructive pulmonary disease) with emphysema ([place]) 09/30/2010  . Osteoporosis 09/30/2010     Past Medical History:  Diagnosis Date  . Anxiety   . Arthritis   . Cataract   . COPD (chronic obstructive pulmonary disease) ([place])   . Depression   . Emphysema of lung ([place])   . H/O bladder infections   . Osteoporosis   . Thyroid disease      Past Surgical History:  Procedure Laterality Date  . ABDOMINAL HYSTERECTOMY       Family History  Problem Relation Age of Onset  . Dementia Mother   . Cancer Father        lung  . Heart disease Father   . Thyroid disease Sister   . Thyroid disease Daughter   . Cancer Maternal Grandmother   . Stroke Maternal Grandfather      Social History   Socioeconomic History  . Marital status: Divorced    Spouse name: n/a  . Number of children: 2  . Years of education: 13+  . Highest education level: Not on file  Occupational History  . Occupation:  retired-bookkeeping    Employer: RETIRED  Social Needs  . Financial resource strain: Not on file  . Food insecurity:    Worry: Not on file    Inability: Not on file  . Transportation needs:    Medical: Not on file    Non-medical: Not on file  Tobacco Use  . Smoking status: Former Smoker    Packs/day: 0.50    Years: 40.00    Pack years: 20.00    Types: Cigarettes    Last attempt to quit: 03/30/2013    Years since quitting: 4.7  . Smokeless tobacco: Never Used  . Tobacco comment: QUIT 03/30/13  Substance and Sexual Activity  . Alcohol use: Yes  Alcohol/week: 0.6 oz    Types: 1 Standard drinks or equivalent per week    Comment: mixed drinks -daily  . Drug use: No  . Sexual activity: Never  Lifestyle  . Physical activity:    Days per week: Not on file    Minutes per session: Not on file  . Stress: Not on file  Relationships  . Social connections:    Talks on phone: Not on file    Gets together: Not on file    Attends religious service: Not on file    Active member of club or organization: Not on file    Attends meetings of clubs or organizations: Not on file    Relationship status: Not on file  . Intimate partner violence:    Fear of current or ex partner: Not on file    Emotionally abused: Not on file    Physically abused: Not on file    Forced sexual activity: Not on file  Other Topics Concern  . 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   Children - 2   Grandchildren - 4 - 3 in college   Exercise - walking - daily 10 mins - 2-3 x/day - walks with oxygen and a walker for weakness and to carry her oxygen   Divorced after 30 years of marriage.     Allergies  Allergen Reactions  . Celebrex [Celecoxib]     Tendonitis  . Levaquin [Levofloxacin]     Tendonitis   . Penicillins Other (See Comments)    psorasis    Prior to Admission medications   Medication Sig Start Date End Date Taking?  Authorizing Provider  acidophilus (RISAQUAD) CAPS capsule Take 1 capsule by mouth daily.   Yes [provider]  alendronate (FOSAMAX) 70 MG tablet TAKE 1 TABLET BY MOUTH EVERY 7 DAYS WITH A FULL GLASS OF WATER ON AN EMPTY STOMACH 04/08/17  Yes Jaynee Eagles, PA-C  ALPRAZolam Duanne Moron) 0.5 MG tablet Take 1 tablet (0.5 mg total) by mouth at bedtime as needed. 01/04/17  Yes Shaquala Broeker L, PA-C  budesonide-formoterol (SYMBICORT) 160-4.5 MCG/ACT inhaler Inhale 2 puffs into the lungs 2 (two) times daily. 06/14/17  Yes Parrett, Tammy S, NP  Calcium Carbonate (CALCIUM 500 PO) Take 500 Units by mouth 3 (three) times daily.   Yes [provider]  Cholecalciferol (VITAMIN D) 2000 UNITS tablet Take 6,000 Units by mouth daily.    Yes [provider]  Cranberry 300 MG tablet Take 300 mg by mouth 2 (two) times daily.   Yes [provider]  dextromethorphan-guaiFENesin (MUCINEX DM) 30-600 MG 12hr tablet Take 1 tablet by mouth 2 (two) times daily.   Yes [provider]  HYDROcodone-acetaminophen (NORCO/VICODIN) 5-325 MG tablet Take 1 tablet by mouth every 6 (six) hours as needed for moderate pain. 02/11/17  Yes Kirby Cortese, Damaris Hippo, PA-C  ipratropium-albuterol (DUONEB) 0.5-2.5 (3) MG/3ML SOLN 1 vial tid 01/04/17  Yes Tyr Franca, Damaris Hippo, PA-C  PROAIR HFA 108 (90 Base) MCG/ACT inhaler Inhale 1-2 puffs every 6 (six) hours as needed into the lungs for wheezing or shortness of breath. 07/30/17  Yes Leonie Douglas, PA-C  Respiratory Therapy Supplies (FLUTTER) DEVI Use as directed 06/26/15  Yes Juanito Doom, MD  Spacer/Aero-Holding Chambers (AEROCHAMBER MV) inhaler Use as instructed 06/26/15  Yes Juanito Doom, MD  tiotropium (SPIRIVA HANDIHALER) 18 MCG inhalation capsule Place 1 capsule (18 mcg  total) into inhaler and inhale daily. 01/04/17  Yes Chevie Birkhead, Damaris Hippo, PA-C     Review of Systems  Constitutional: Negative.   HENT: Positive for dental problem (left upper tooth pain).     Eyes: Negative.   Respiratory: Positive for shortness of breath (no change) and wheezing (no changes).   Cardiovascular: Negative.   Gastrointestinal: Negative.   Endocrine: Negative.   Genitourinary: Negative.   Musculoskeletal: Negative.   Skin: Negative.   Allergic/Immunologic: Negative.   Neurological: Negative.   Hematological: Negative.   Psychiatric/Behavioral: Negative.  Negative for dysphoric mood (doing well - good and realistic outlook in life).     PHYSICAL EXAM: BP 128/60   Pulse 93   Temp 98.3 F (36.8 C) (Oral)   Resp 20   Ht 5' 7.5" (1.715 m)   Wt 162 lb 12.8 oz (73.8 kg)   SpO2 90%   BMI 25.12 kg/m   Wt Readings from Last 3 Encounters:  01/10/18 162 lb 12.8 oz (73.8 kg)  10/26/17 165 lb (74.8 kg)  07/30/17 165 lb 3.2 oz (74.9 kg)      Visual Acuity Screening   Right eye Left eye Both eyes  Without correction: 20/25-2 20/25-1 20/30  With correction:       Physical Exam  Constitutional: She is oriented to person, place, and time. She appears well-developed and well-nourished.  HENT:  Head: Normocephalic and atraumatic.  Right Ear: External ear normal.  Left Ear: External ear normal.  Nose: Nose normal.  Mouth/Throat: Oropharynx is clear and moist.  Eyes: Pupils are equal, round, and reactive to light. Conjunctivae and EOM are normal.  Neck: Normal range of motion. Neck supple. Carotid bruit is not present. No thyroid mass and no thyromegaly present.  Cardiovascular: Normal rate, regular rhythm and normal heart sounds.  No murmur heard. Respiratory: Effort normal and breath sounds normal. She has no wheezes.  GI: Soft. Bowel sounds are normal.  Musculoskeletal: Normal range of motion.  Neurological: She is alert and oriented to person, place, and time.  Skin: Skin is warm and dry.  Psychiatric: She has a normal mood and affect. Her behavior is normal. Judgment and thought content normal.     Education/Counseling: yes diet and exercise yes  prevention of chronic diseases no smoking/tobacco cessation yes review "Covered Medicare Preventive Services"    ASSESSMENT/PLAN: Pulmonary emphysema, unspecified emphysema type ([place]) - Plan: budesonide-formoterol (SYMBICORT) 160-4.5 MCG/ACT inhaler, tiotropium (SPIRIVA HANDIHALER) 18 MCG inhalation capsule, PROAIR HFA 108 (90 Base) MCG/ACT inhaler, ipratropium-albuterol (DUONEB) 0.5-2.5 (3) MG/3ML SOLN  Tooth pain - Plan: clindamycin (CLEOCIN) 300 MG capsule  Dental decay - Plan: clindamycin (CLEOCIN) 300 MG capsule  Screening for diabetes mellitus - Plan: CMP14+EGFR  Medicare annual wellness visit, subsequent - Plan: Hemoglobin A1c  Moderate episode of recurrent major depressive disorder (HCC)  Age-related osteoporosis without current pathological fracture  Estrogen deficiency - Plan: VITAMIN D 25 Hydroxy (Vit-D Deficiency, Fractures)  History of hypothyroidism - Plan: TSH  Screening, lipid - Plan: Lipid panel, CANCELED: CBC with Differential/Platelet  Anxiety state - Plan: ALPRAZolam (XANAX) 0.5 MG tablet  Osteoporosis - Plan: alendronate (FOSAMAX) 70 MG tablet   Medications all refilled.  Pt will schedule bone density.  She declines further screening at this time.  She will f/u with dentist.  Windell Hummingbird PA-C  [facility] 01/10/2018 3:19 PM

## 2018-01-10 NOTE — Patient Instructions (Signed)
     IF you received an x-ray today, you will receive an invoice from [facility]. Please contact [facility] at [phone] with questions or concerns regarding your invoice.   IF you received labwork today, you will receive an invoice from [facility]. Please contact [facility] at [phone] with questions or concerns regarding your invoice.   Our billing staff will not be able to assist you with questions regarding bills from these companies.  You will be contacted with the lab results as soon as they are available. The fastest way to get your results is to activate your My Chart account. Instructions are located on the last page of this paperwork. If you have not heard from us regarding the results in 2 weeks, please contact this office.     

## 2018-01-11 LAB — CMP14+EGFR
A/G RATIO: 1.6 (ref 1.2–2.2)
ALBUMIN: 4 g/dL (ref 3.5–4.8)
ALT: 17 IU/L (ref 0–32)
AST: 17 IU/L (ref 0–40)
Alkaline Phosphatase: 95 IU/L (ref 39–117)
BUN / CREAT RATIO: 13 (ref 12–28)
BUN: 11 mg/dL (ref 8–27)
Bilirubin Total: 0.3 mg/dL (ref 0.0–1.2)
CALCIUM: 9.2 mg/dL (ref 8.7–10.3)
CO2: 27 mmol/L (ref 20–29)
Chloride: 105 mmol/L (ref 96–106)
Creatinine, Ser: 0.82 mg/dL (ref 0.57–1.00)
GFR calc Af Amer: 82 mL/min/{1.73_m2} (ref >59)
GFR, EST NON AFRICAN AMERICAN: 71 mL/min/{1.73_m2} (ref >59)
Globulin, Total: 2.5 g/dL (ref 1.5–4.5)
Glucose: 98 mg/dL (ref 65–99)
Potassium: 3.9 mmol/L (ref 3.5–5.2)
Sodium: 146 mmol/L — ABNORMAL HIGH (ref 134–144)
TOTAL PROTEIN: 6.5 g/dL (ref 6.0–8.5)

## 2018-01-11 LAB — VITAMIN D 25 HYDROXY (VIT D DEFICIENCY, FRACTURES): VIT D 25 HYDROXY: 60.5 ng/mL (ref 30.0–100.0)

## 2018-01-11 LAB — LIPID PANEL
Chol/HDL Ratio: 2.4 ratio (ref 0.0–4.4)
Cholesterol, Total: 157 mg/dL (ref 100–199)
HDL: 65 mg/dL (ref >39)
LDL Calculated: 81 mg/dL (ref 0–99)
TRIGLYCERIDES: 55 mg/dL (ref 0–149)
VLDL Cholesterol Cal: 11 mg/dL (ref 5–40)

## 2018-01-11 LAB — TSH: TSH: 1.56 u[IU]/mL (ref 0.450–4.500)

## 2018-01-11 LAB — HEMOGLOBIN A1C
Est. average glucose Bld gHb Est-mCnc: 108 mg/dL
Hgb A1c MFr Bld: 5.4 % (ref 4.8–5.6)

## 2018-01-26 ENCOUNTER — Other Ambulatory Visit: Payer: Self-pay | Admitting: Physician Assistant

## 2018-01-26 DIAGNOSIS — J439 Emphysema, unspecified: Secondary | ICD-10-CM

## 2018-01-31 DIAGNOSIS — J438 Other emphysema: Secondary | ICD-10-CM | POA: Diagnosis not present

## 2018-02-02 ENCOUNTER — Encounter: Payer: Self-pay | Admitting: Physician Assistant

## 2018-02-02 ENCOUNTER — Ambulatory Visit (INDEPENDENT_AMBULATORY_CARE_PROVIDER_SITE_OTHER): Payer: Medicare Other | Admitting: Physician Assistant

## 2018-02-02 ENCOUNTER — Other Ambulatory Visit: Payer: Self-pay

## 2018-02-02 VITALS — BP 130/64 | HR 85 | Temp 98.9°F | Resp 20 | Ht 67.56 in | Wt 160.4 lb

## 2018-02-02 DIAGNOSIS — S91311A Laceration without foreign body, right foot, initial encounter: Secondary | ICD-10-CM | POA: Diagnosis not present

## 2018-02-02 DIAGNOSIS — J439 Emphysema, unspecified: Secondary | ICD-10-CM

## 2018-02-02 MED ORDER — PROAIR HFA 108 (90 BASE) MCG/ACT IN AERS: 1.0000 | INHALATION_SPRAY | Freq: Four times a day (QID) | RESPIRATORY_TRACT | 11 refills | Status: AC | PRN

## 2018-02-02 MED ORDER — CEPHALEXIN 500 MG PO CAPS: 500.0000 mg | ORAL_CAPSULE | Freq: Three times a day (TID) | ORAL | 0 refills | Status: DC

## 2018-02-02 NOTE — Patient Instructions (Signed)
     IF you received an x-ray today, you will receive an invoice from [facility]. Please contact [facility] at [phone] with questions or concerns regarding your invoice.   IF you received labwork today, you will receive an invoice from [facility]. Please contact [facility] at [phone] with questions or concerns regarding your invoice.   Our billing staff will not be able to assist you with questions regarding bills from these companies.  You will be contacted with the lab results as soon as they are available. The fastest way to get your results is to activate your My Chart account. Instructions are located on the last page of this paperwork. If you have not heard from us regarding the results in 2 weeks, please contact this office.     

## 2018-02-02 NOTE — Progress Notes (Signed)
Rachel Perez  MRN: [id number] DOB: September 08, 1941  PCP: Morrell Riddle, PA-C  Chief Complaint  Patient presents with  . Laceration    X 1 week - to of right foot    Subjective:  Pt presents to clinic for wound on her right foot that she has had for about a week.  She hit her foot on a stool and peeled the skin back.  She gets skin tears a lot and she put the flap back and has continued to watch it.  She is having some swelling in her foot and she is leaking some serous fluid but today she noticed a little bit of redness around the wound and wanted to have it evaluated.She is having no F/C.  She feels great.  History is obtained by patient.  Review of Systems  Constitutional: Negative for chills and fever.  Skin: Positive for wound.    Patient Active Problem List   Diagnosis Date Noted  . Moderate episode of recurrent major depressive disorder (HCC) 01/04/2017  . Daytime somnolence 06/07/2016  . Chronic respiratory failure with hypoxia (HCC) 06/07/2016  . History of rheumatic fever 01/27/2016  . Vitamin D deficiency 01/27/2016  . Dyspnea 06/26/2015  . History of tobacco use 05/09/2013  . [facility] 10/01/2010  . History of hypothyroidism 09/30/2010  . Anxiety 09/30/2010  . HEMORRHOIDS 09/30/2010  . COPD (chronic obstructive pulmonary disease) with emphysema (HCC) 09/30/2010  . Osteoporosis 09/30/2010    Current Outpatient Medications on File Prior to Visit  Medication Sig Dispense Refill  . acidophilus (RISAQUAD) CAPS capsule Take 1 capsule by mouth daily.    Marland Kitchen alendronate (FOSAMAX) 70 MG tablet TAKE 1 TABLET BY MOUTH EVERY 7 DAYS WITH A FULL GLASS OF WATER ON AN EMPTY STOMACH 4 tablet 11  . ALPRAZolam (XANAX) 0.5 MG tablet Take 1 tablet (0.5 mg total) by mouth at bedtime as needed. 30 tablet 0  . budesonide-formoterol (SYMBICORT) 160-4.5 MCG/ACT inhaler Inhale 2 puffs into the lungs 2 (two) times daily. 1 Inhaler 11  . Calcium Carbonate (CALCIUM 500 PO) Take 500 Units by  mouth 3 (three) times daily.    . Cholecalciferol (VITAMIN D) 2000 UNITS tablet Take 6,000 Units by mouth daily.     . clindamycin (CLEOCIN) 300 MG capsule Take 1 capsule (300 mg total) by mouth 3 (three) times daily. 30 capsule 0  . Cranberry 300 MG tablet Take 300 mg by mouth 2 (two) times daily.    Marland Kitchen dextromethorphan-guaiFENesin (MUCINEX DM) 30-600 MG 12hr tablet Take 1 tablet by mouth 2 (two) times daily.    Marland Kitchen ipratropium-albuterol (DUONEB) 0.5-2.5 (3) MG/3ML SOLN 1 vial tid 270 mL 11  . Respiratory Therapy Supplies (FLUTTER) DEVI Use as directed 1 each 0  . Spacer/Aero-Holding Chambers (AEROCHAMBER MV) inhaler Use as instructed 1 each 0  . tiotropium (SPIRIVA HANDIHALER) 18 MCG inhalation capsule Place 1 capsule (18 mcg total) into inhaler and inhale daily. 30 capsule 11   No current facility-administered medications on file prior to visit.     Allergies  Allergen Reactions  . Celebrex [Celecoxib]     Tendonitis  . Levaquin [Levofloxacin]     Tendonitis   . Penicillins Other (See Comments)    psorasis    Past Medical History:  Diagnosis Date  . Anxiety   . Arthritis   . Cataract   . COPD (chronic obstructive pulmonary disease) (HCC)   . Depression   . Emphysema of lung (HCC)   . H/O  bladder infections   . Osteoporosis   . 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   Children - 2   Grandchildren - 4 - 3 in college   Exercise - walking - daily 10 mins - 2-3 x/day - walks with oxygen and a walker for weakness and to carry her oxygen   Divorced after 30 years of marriage.   Social History   Tobacco Use  . Smoking status: Former Smoker    Packs/day: 0.50    Years: 40.00    Pack years: 20.00    Types: Cigarettes    Last attempt to quit: 03/30/2013    Years since quitting: 4.8  . Smokeless tobacco: Never Used  . Tobacco comment: QUIT 03/30/13  Substance Use Topics  .  Alcohol use: Yes    Alcohol/week: 0.6 oz    Types: 1 Standard drinks or equivalent per week    Comment: mixed drinks -daily  . Drug use: No   family history includes Cancer in her father and maternal grandmother; Dementia in her mother; Heart disease in her father; Stroke in her maternal grandfather; Thyroid disease in her daughter and sister.     Objective:  BP 130/64 (BP Location: Left Arm, Patient Position: Sitting, Cuff Size: Normal)   Pulse 85   Temp 98.9 F (37.2 C) (Oral)   Resp 20   Ht 5' 7.56" (1.716 m)   Wt 160 lb 6.4 oz (72.8 kg)   SpO2 93%   BMI 24.71 kg/m  Body mass index is 24.71 kg/m.  Physical Exam  Constitutional: She is oriented to person, place, and time. She appears well-developed and well-nourished.  HENT:  Head: Normocephalic and atraumatic.  Right Ear: Hearing and external ear normal.  Left Ear: Hearing and external ear normal.  Eyes: Conjunctivae are normal.  Neck: Normal range of motion.  Pulmonary/Chest: Effort normal.  Neurological: She is alert and oriented to person, place, and time.  Skin: Skin is warm, dry and intact.  V shaped wound on top of right foot, foot is swollen with good pulses and good capillary refill. She has some eschar formation between the skin edges - there is a small amount of reactive erythema at skin edges.  No warmth or sign of infection at this time.  Psychiatric: She has a normal mood and affect. Her behavior is normal. Judgment and thought content normal.  Vitals reviewed.   Assessment and Plan :  Tear of skin of plantar aspect of right foot, initial encounter - Plan: cephALEXin (KEFLEX) 500 MG capsule - d/w pt what erythema from infection would look like - she will start the abx if she notices this or if she see pus coming out of wound she will RTC for recheck  Pulmonary emphysema, unspecified emphysema type (HCC) - Plan: PROAIR HFA 108 (90 Base) MCG/ACT inhaler - she has been getting 2 inhalers which allow her to do  the exercising she is supposed for her her lungs.    Benny Lennert PA-C  [facility] at [facility] 02/02/2018 4:53 PM

## 2018-03-03 DIAGNOSIS — J438 Other emphysema: Secondary | ICD-10-CM | POA: Diagnosis not present

## 2018-04-02 DIAGNOSIS — J438 Other emphysema: Secondary | ICD-10-CM | POA: Diagnosis not present

## 2018-09-03 IMAGING — DX DG CHEST 2V
2 series · 2 of 2 positions shown · non-contrast
Comparison: 03/08/2017

CLINICAL DATA: Wheezing

EXAM:
CHEST  2 VIEW

[chest pa]
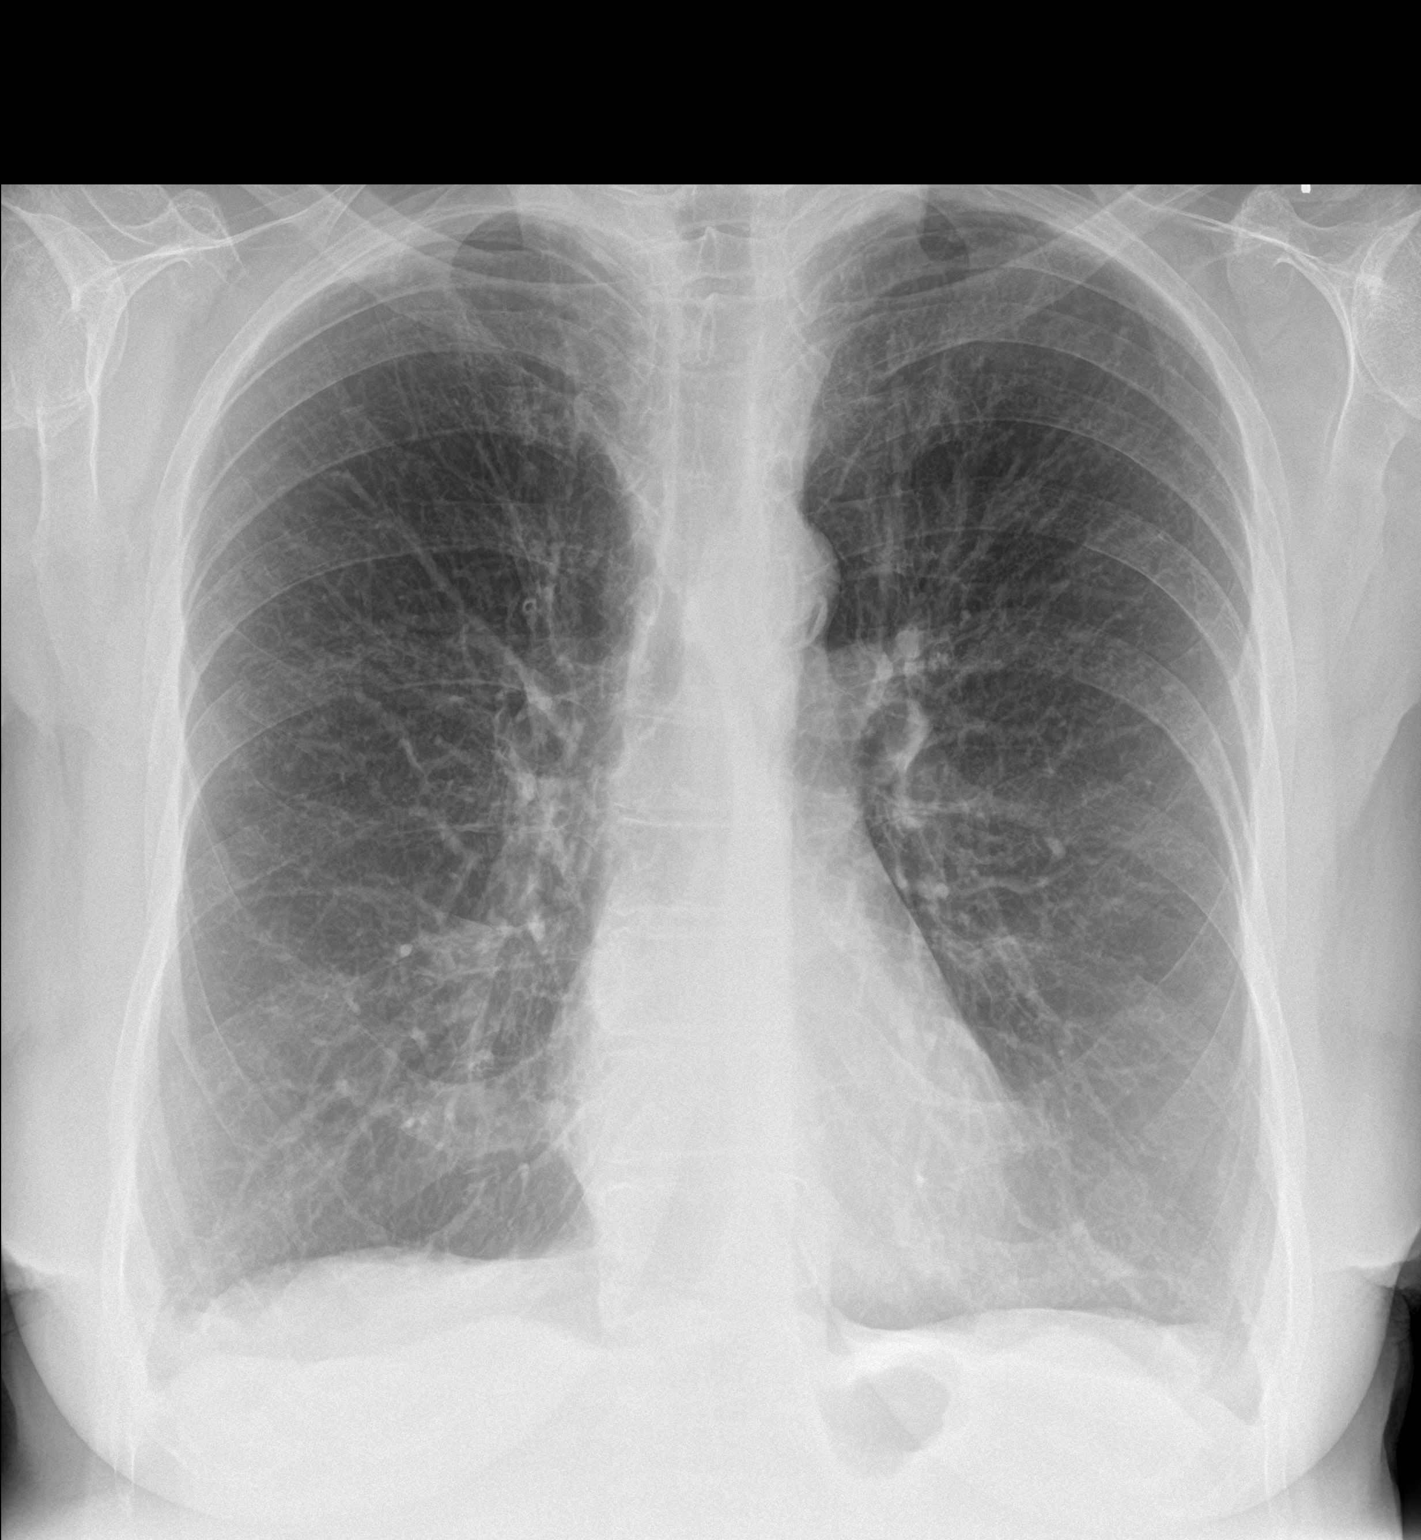

[chest lat]
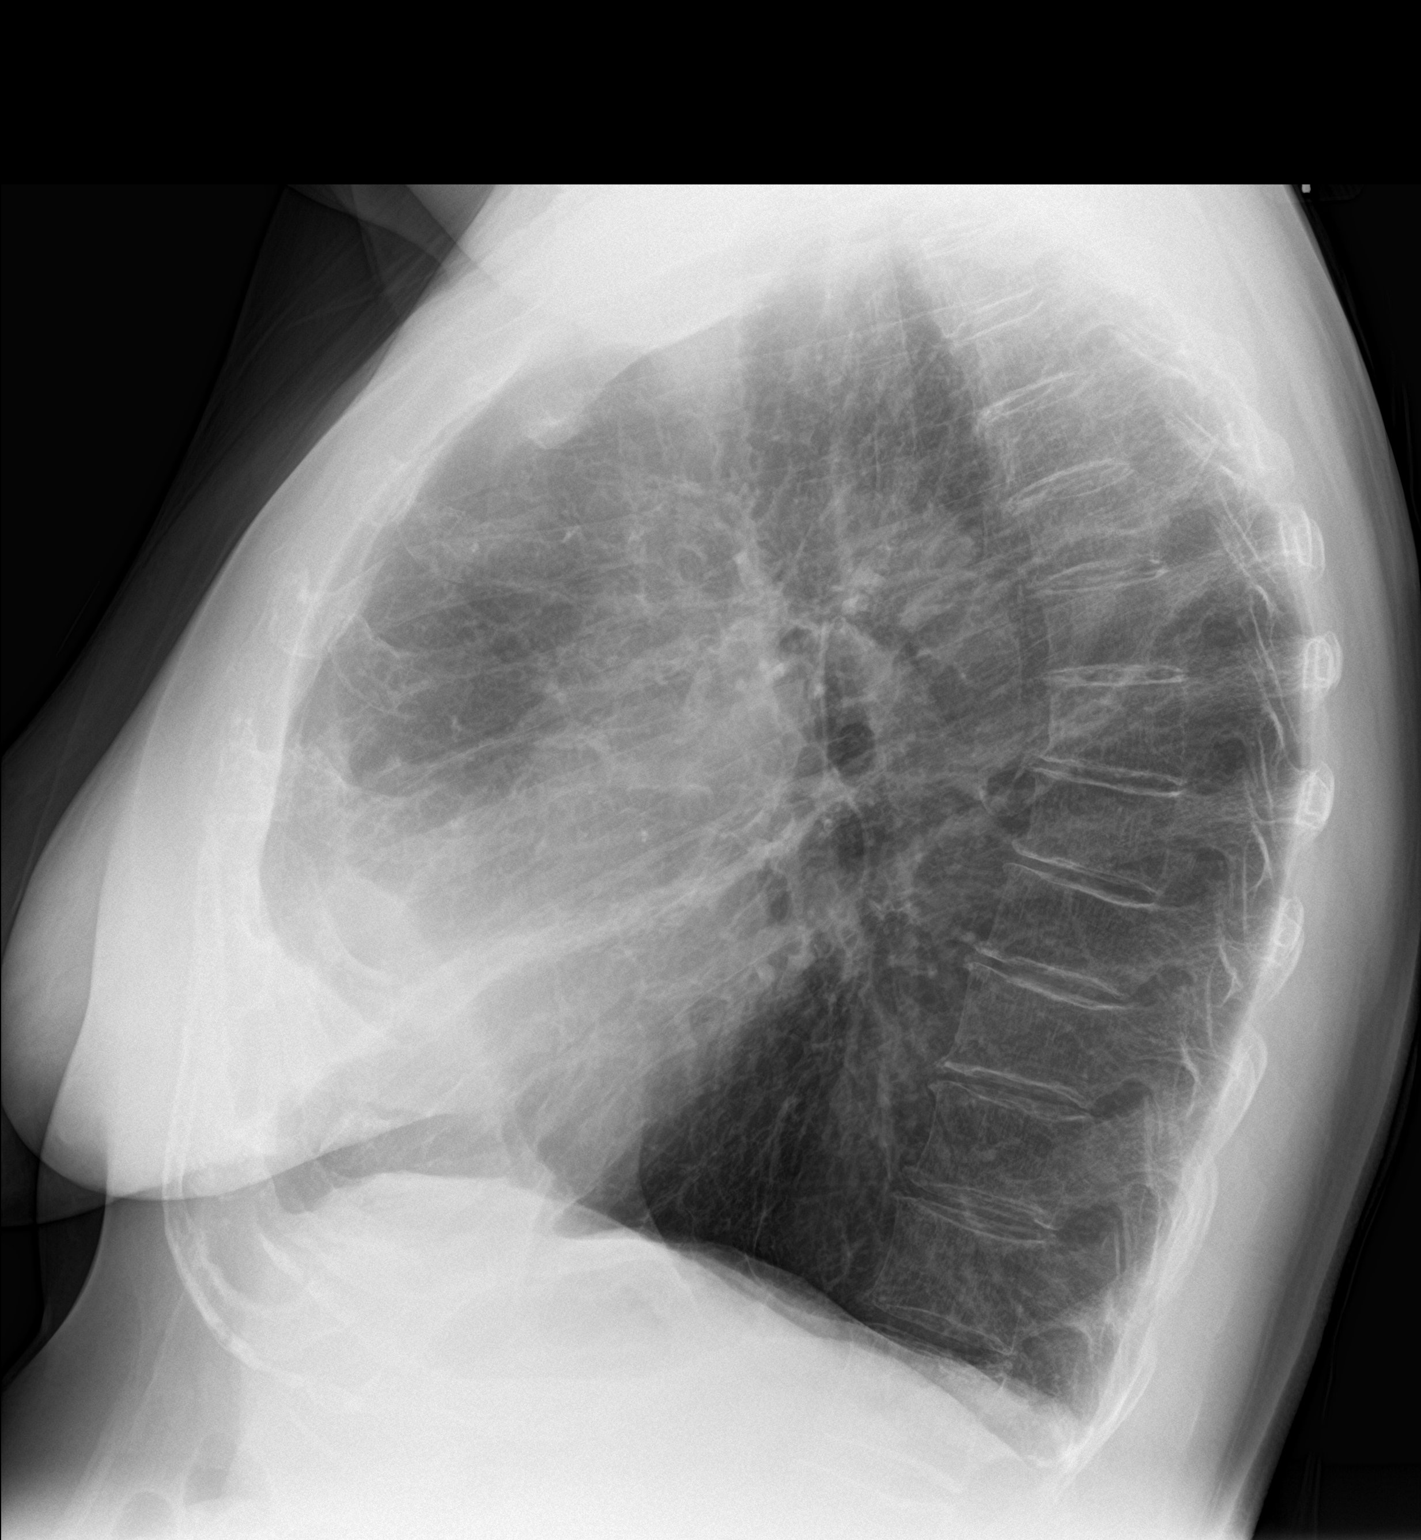

[2 of 2 positions shown; findings below may reference images not displayed]

FINDINGS: Cardiac shadow is within normal limits. Aortic calcifications are
seen. The lungs are hyperinflated consistent with COPD. No focal
infiltrate or sizable effusion is seen. No bony abnormality is
noted.
IMPRESSION: COPD without acute abnormality.

## 2019-09-17 ENCOUNTER — Telehealth (INDEPENDENT_AMBULATORY_CARE_PROVIDER_SITE_OTHER): Payer: Medicare Other | Admitting: Pulmonary Disease

## 2019-09-17 DIAGNOSIS — R0602 Shortness of breath: Secondary | ICD-10-CM | POA: Diagnosis not present

## 2019-09-17 DIAGNOSIS — J9611 Chronic respiratory failure with hypoxia: Secondary | ICD-10-CM | POA: Diagnosis not present

## 2019-09-17 DIAGNOSIS — R600 Localized edema: Secondary | ICD-10-CM

## 2019-09-17 NOTE — Progress Notes (Signed)
Subjective:    Patient ID: Rachel Perez, female    DOB: February 15, 1941, 79 y.o.   MRN: [id number]  Synopsis: Former patient of Dr. Kendrick Perez who has COPD.   HPI Chief Complaint  Patient presents with  . Follow-up    former BQ pt being treated for emphysema.  pt has been isolating since March.  states she is doing well.     [name] is a 79 year old female with severe emphysema who presents for follow-up.  She is a former Dr. Kendrick Perez patient. She is compliant with her inhalers. She does use her Albuterol three times a day. With activity, her oxygen will drop to 85%. She has nocturnal oxygen but does not wear usually during the day. She is not exercising as much due to pandemic. She feels this is contributing to her general weakness and stamina.   Past Medical History:  Diagnosis Date  . Anxiety   . Arthritis   . Cataract   . COPD (chronic obstructive pulmonary disease) (HCC)   . Depression   . Emphysema of lung (HCC)   . H/O bladder infections   . Osteoporosis   . Thyroid disease    Review of Systems  Constitutional: Negative for chills, diaphoresis and fever.  HENT: Negative for congestion.   Respiratory: Negative for cough, shortness of breath and wheezing.   Cardiovascular: Negative for chest pain, palpitations and leg swelling.      Objective:   There were no vitals filed for this visit.  SpO2 94% on RA  Physical Exam: General: Well-appearing, no acute distress HENT: [place], AT, OP clear Eyes: EOMI, no scleral icterus Respiratory: No respiratory distress Neuro: AAO x4, CNII-XII grossly intact Skin: Intact, no rashes or bruising Psych: Normal mood, normal affect  Interim Data:  PFT 07/17/2015 FVC 2.45  (74%) FEV1 0.75 (29%) Ratio 36  TLC 192% DLCO 30%. Significant BD response and air trapping. Consistent with emphysema Interpretation: Very severe emphysema  Sleep study: Sleep study in November 2017 showed nocturnal hypoxemia (82% on 2L) but no  evidence of obstructive sleep apnea.  Imaging, labs and test noted above have been reviewed independently by me.    Assessment & Plan:   Discussion: 79 year old female with very severe COPD, chronic hypoxemic respiratory failure and deconditioning who presents for follow-up. No COPD exacerbations since then.   Plan: Shortness of breath, lower extremity swelling, exertional hypoxemia --Will order echocardiogram --Maintain oxygen for goal >88% with activity and when sleeping  Very severe COPD --Continue Symbicort and Spiriva  --Continue nebulizer as needed for shortness of breath --Continue daily mucinex  Deconditioning --Encourage frequent activity  Nocturnal and exertional hypoxemia --Request humidier from DME (previously [facility]) --Continue using 2L O2 when you exert yourself  Follow-up in 3 months   Current Outpatient Medications:  .  acidophilus (RISAQUAD) CAPS capsule, Take 1 capsule by mouth daily., Disp: , Rfl:  .  alendronate (FOSAMAX) 70 MG tablet, TAKE 1 TABLET BY MOUTH EVERY 7 DAYS WITH A FULL GLASS OF WATER ON AN EMPTY STOMACH, Disp: 4 tablet, Rfl: 11 .  ALPRAZolam (XANAX) 0.5 MG tablet, Take 1 tablet (0.5 mg total) by mouth at bedtime as needed., Disp: 30 tablet, Rfl: 0 .  budesonide-formoterol (SYMBICORT) 160-4.5 MCG/ACT inhaler, Inhale 2 puffs into the lungs 2 (two) times daily., Disp: 1 Inhaler, Rfl: 11 .  Calcium Carbonate (CALCIUM 500 PO), Take 500 Units by mouth 3 (three) times daily., Disp: , Rfl:  .  cephALEXin (KEFLEX) 500 MG capsule, Take 1 capsule (500 mg total) by mouth 3 (three) times daily., Disp: 30 capsule, Rfl: 0 .  Cholecalciferol (VITAMIN D) 2000 UNITS tablet, Take 6,000 Units by mouth daily. , Disp: , Rfl:  .  clindamycin (CLEOCIN) 300 MG capsule, Take 1 capsule (300 mg total) by mouth 3 (three) times daily., Disp: 30 capsule, Rfl: 0 .  Cranberry 300 MG tablet, Take 300 mg by mouth 2 (two) times daily., Disp: , Rfl:  .   dextromethorphan-guaiFENesin (MUCINEX DM) 30-600 MG 12hr tablet, Take 1 tablet by mouth 2 (two) times daily., Disp: , Rfl:  .  ipratropium-albuterol (DUONEB) 0.5-2.5 (3) MG/3ML SOLN, 1 vial tid, Disp: 270 mL, Rfl: 11 .  PROAIR HFA 108 (90 Base) MCG/ACT inhaler, Inhale 1-2 puffs into the lungs every 6 (six) hours as needed for wheezing or shortness of breath., Disp: 2 Inhaler, Rfl: 11 .  Respiratory Therapy Supplies (FLUTTER) DEVI, Use as directed, Disp: 1 each, Rfl: 0 .  Spacer/Aero-Holding Chambers (AEROCHAMBER MV) inhaler, Use as instructed, Disp: 1 each, Rfl: 0 .  tiotropium (SPIRIVA HANDIHALER) 18 MCG inhalation capsule, Place 1 capsule (18 mcg total) into inhaler and inhale daily., Disp: 30 capsule, Rfl: 11  I have spent 31-minutes charting, coordinating care and discussing medical diagnosis, plan and counseling with the patient/family as noted above.  Rodman Pickle, M.D. [facility] Pulmonary/Critical Care Medicine 09/17/2019 12:27 PM

## 2019-09-17 NOTE — Patient Instructions (Signed)
Shortness of breath, lower extremity swelling, exertional hypoxemia --Will order echocardiogram --Maintain oxygen for goal >88% with activity and when sleeping  Very severe COPD --Continue Symbicort and Spiriva  --Continue nebulizer as needed for shortness of breath --Continue daily mucinex  Deconditioning --Encourage frequent activity  Nocturnal and exertional hypoxemia --Request humidier from DME (previously [facility]) --Continue using 2L O2 when you exert yourself  Follow-up in 3 months

## 2019-10-10 ENCOUNTER — Ambulatory Visit: Payer: Medicare Other

## 2019-10-23 ENCOUNTER — Other Ambulatory Visit (HOSPITAL_COMMUNITY): Payer: Medicare Other

## 2019-12-12 ENCOUNTER — Other Ambulatory Visit (HOSPITAL_COMMUNITY): Payer: Medicare Other

## 2020-01-02 ENCOUNTER — Other Ambulatory Visit: Payer: Self-pay

## 2020-01-02 ENCOUNTER — Ambulatory Visit (HOSPITAL_COMMUNITY): Payer: Medicare Other | Attending: Pulmonary Disease

## 2020-01-02 DIAGNOSIS — R0602 Shortness of breath: Secondary | ICD-10-CM

## 2020-01-02 DIAGNOSIS — R600 Localized edema: Secondary | ICD-10-CM | POA: Diagnosis not present

## 2020-01-21 ENCOUNTER — Ambulatory Visit (INDEPENDENT_AMBULATORY_CARE_PROVIDER_SITE_OTHER): Payer: Medicare Other

## 2020-01-21 ENCOUNTER — Other Ambulatory Visit: Payer: Self-pay

## 2020-01-21 ENCOUNTER — Ambulatory Visit: Payer: Medicare Other | Admitting: Adult Health

## 2020-01-21 ENCOUNTER — Encounter: Payer: Self-pay | Admitting: Adult Health

## 2020-01-21 VITALS — BP 130/50 | HR 114 | Temp 97.5°F | Ht 67.0 in | Wt 157.0 lb

## 2020-01-21 DIAGNOSIS — J432 Centrilobular emphysema: Secondary | ICD-10-CM

## 2020-01-21 DIAGNOSIS — J9611 Chronic respiratory failure with hypoxia: Secondary | ICD-10-CM

## 2020-01-21 NOTE — Assessment & Plan Note (Addendum)
Continue on oxygen 2 L with activity and at bedtime O2 saturation goal greater than 90%. Recommend pulmonary rehab however she wants to try to do activity on her own.  Of advised to advance slowly.  As she has significant physical deconditioning. Plan  Patient Instructions  Continue on Symbicort and Spiriva . Rinse after use.  Duoneb Three times a day  .  Need to increased activity as tolerated. Call back if you change your mind on pulmonary rehab.  Oxygen 2l/m with activity and At bedtime  .  Chest xray today  Follow up in 4 months with Dr. Everardo All and As needed   Please contact office for sooner follow up if symptoms do not improve or worsen or seek emergency care

## 2020-01-21 NOTE — Patient Instructions (Addendum)
Continue on Symbicort and Spiriva . Rinse after use.  Duoneb Three times a day  .  Need to increased activity as tolerated. Call back if you change your mind on pulmonary rehab.  Oxygen 2l/m with activity and At bedtime  .  Chest xray today  Follow up in 4 months with Dr. Everardo All and As needed   Please contact office for sooner follow up if symptoms do not improve or worsen or seek emergency care

## 2020-01-21 NOTE — Progress Notes (Signed)
  ID: Osie Cheeks, female    DOB: Aug 04, 1941, 79 y.o.   MRN: [id number]  Chief Complaint  Patient presents with  . Follow-up    COPD     Referring provider: Larkin Ina  HPI: 79 year old female former smoker followed for COPD and oxygen dependent respiratory failure on oxygen 2 L with activity and bedtime   TEST/EVENTS :   PFT's 11/2010: FEV1 0.76, ratio 32, 47% improvement with BD, no restriction, +airtrapping, DLCO 53%.  November 2016 pulmonary function testing ratio 31%, FEV1 0.75 L (29% predicted, 12% change but not significant), total lung capacity 10.56 L (192% predicted, residual volume 8.54 L (358% predicted), DLCO 8.62 (30% predicted)  Sleep study: Sleep study in November 2017 showed nocturnal hypoxemia (82% on 2L) but no evidence of obstructive sleep apnea.  01/21/2020 Follow up : COPD and O2 RF  Patient returns for a 11-month follow-up.  She has very severe COPD and is oxygen dependent.  Last visit patient was recommend to wear oxygen with activity to keep O2 saturations greater than 88 to 90%.  She was also continued on nocturnal oxygen at 2 L.  She was set up for a 2D echo was done on January 02, 2020 that showed EF of 55 to 60%, normal pulmonary artery systolic pressure. On the conclusion : There is a prominent  intra-atrial septum with a linear structure extending into the right  atrium. Not well visualized on this study. Recommend TEE for further  evaluation. Her PCP has referred to Cardiology  Chest x-ray 2019 showed hyperinflation consistent with COPD. Has POC that she uses on occasion when she is out .  Sedentary lifestyle. Gets winded with minimal activity . Got worse with pandemic .  Does not drive .   Got Covid Vaccine in January Moderna.   Drives short distance . Declines pulmonary rehab. Previous did that in past.   Has some dental issues , currently on antibiotics. Had dental work prior to pandemic , rest got delayed due to  precaution . Has to make follow up appointment .     Allergies  Allergen Reactions  . Celebrex [Celecoxib]     Tendonitis  . Levaquin [Levofloxacin]     Tendonitis   . Penicillins Other (See Comments)    psorasis    Immunization History  Administered Date(s) Administered  . Influenza Split 11/03/2011, 06/21/2013  . Influenza, High Dose Seasonal PF 06/07/2016, 06/14/2017, 06/13/2018, 07/07/2018, 06/21/2019  . Influenza,inj,Quad PF,6+ Mos 10/15/2014, 06/26/2015  . Moderna SARS-COVID-2 Vaccination 09/26/2019, 10/24/2019  . Pneumococcal Conjugate-13 10/15/2014  . Pneumococcal Polysaccharide-23 09/10/2009  . Pneumococcal-Unspecified 09/10/2009  . Td 09/10/2009, 01/18/2020  . Zoster 01/22/2010    Past Medical History:  Diagnosis Date  . Anxiety   . Arthritis   . Cataract   . COPD (chronic obstructive pulmonary disease) (HCC)   . Depression   . Emphysema of lung (HCC)   . H/O bladder infections   . Osteoporosis   . Thyroid disease     Tobacco History: Social History   Tobacco Use  Smoking Status Former Smoker  . Packs/day: 0.50  . Years: 40.00  . Pack years: 20.00  . Types: Cigarettes  . Quit date: 03/30/2013  . Years since quitting: 6.8  Smokeless Tobacco Never Used  Tobacco Comment   QUIT 03/30/13   Counseling given: Not Answered Comment: QUIT 03/30/13   Outpatient Medications Prior to Visit  Medication Sig Dispense Refill  . acidophilus (RISAQUAD) CAPS capsule Take  1 capsule by mouth daily.    Marland Kitchen alendronate (FOSAMAX) 70 MG tablet TAKE 1 TABLET BY MOUTH EVERY 7 DAYS WITH A FULL GLASS OF WATER ON AN EMPTY STOMACH 4 tablet 11  . ALPRAZolam (XANAX) 0.5 MG tablet Take 1 tablet (0.5 mg total) by mouth at bedtime as needed. 30 tablet 0  . budesonide-formoterol (SYMBICORT) 160-4.5 MCG/ACT inhaler Inhale 2 puffs into the lungs 2 (two) times daily. 1 Inhaler 11  . Calcium Carbonate (CALCIUM 500 PO) Take 500 Units by mouth 3 (three) times daily.    . Cholecalciferol  (VITAMIN D) 2000 UNITS tablet Take 6,000 Units by mouth daily.     . clindamycin (CLEOCIN) 300 MG capsule Take 300 mg by mouth 3 (three) times daily.    . Cranberry 300 MG tablet Take 300 mg by mouth 2 (two) times daily.    Marland Kitchen dextromethorphan-guaiFENesin (MUCINEX DM) 30-600 MG 12hr tablet Take 1 tablet by mouth 2 (two) times daily.    Marland Kitchen ipratropium-albuterol (DUONEB) 0.5-2.5 (3) MG/3ML SOLN 1 vial tid 270 mL 11  . Multiple Vitamin (MULTIVITAMIN) tablet Take 1 tablet by mouth daily.    Marland Kitchen PROAIR HFA 108 (90 Base) MCG/ACT inhaler Inhale 1-2 puffs into the lungs every 6 (six) hours as needed for wheezing or shortness of breath. 2 Inhaler 11  . Respiratory Therapy Supplies (FLUTTER) DEVI Use as directed 1 each 0  . Spacer/Aero-Holding Chambers (AEROCHAMBER MV) inhaler Use as instructed 1 each 0  . tiotropium (SPIRIVA HANDIHALER) 18 MCG inhalation capsule Place 1 capsule (18 mcg total) into inhaler and inhale daily. 30 capsule 11   No facility-administered medications prior to visit.     Review of Systems:   Constitutional:   No  weight loss, night sweats,  Fevers, chills,  +fatigue, or  lassitude.  HEENT:   No headaches,  Difficulty swallowing,  Tooth/dental problems, or  Sore throat,                No sneezing, itching, ear ache, nasal congestion, post nasal drip,   CV:  No chest pain,  Orthopnea, PND, swelling in lower extremities, anasarca, dizziness, palpitations, syncope.   GI  No heartburn, indigestion, abdominal pain, nausea, vomiting, diarrhea, change in bowel habits, loss of appetite, bloody stools.   Resp:    No chest wall deformity  Skin: no rash or lesions.  GU: no dysuria, change in color of urine, no urgency or frequency.  No flank pain, no hematuria   MS:  No joint pain or swelling.  No decreased range of motion.  No back pain.    Physical Exam  BP (!) 130/50 (BP Location: Right Arm, Patient Position: Sitting, Cuff Size: Normal)   Pulse (!) 114   Temp (!) 97.5 F  (36.4 C) (Temporal)   Ht 5\' 7"  (1.702 m)   Wt 157 lb (71.2 kg)   SpO2 98%   BMI 24.59 kg/m   GEN: A/Ox3; pleasant , NAD, in wheelchair, on O2    HEENT:  [place]/AT,    NOSE-clear, THROAT-clear, no lesions, no postnasal drip or exudate noted.  Dental caries .   NECK:  Supple w/ fair ROM; no JVD; normal carotid impulses w/o bruits; no thyromegaly or nodules palpated; no lymphadenopathy.    RESP  Decreased BS in bases  no accessory muscle use, no dullness to percussion  CARD:  RRR, no m/r/g, 1+ peripheral edema, pulses intact, no cyanosis or clubbing.  GI:   Soft & nt; nml bowel sounds; no  organomegaly or masses detected.   Musco: Warm bil, no deformities or joint swelling noted.   Neuro: alert, no focal deficits noted.    Skin: Warm, no lesions or rashes    Lab Results:  CBC    Component Value Date/Time   WBC 8.4 01/27/2016 1435   RBC 4.66 01/27/2016 1435   HGB 14.1 01/27/2016 1435   HCT 42.6 01/27/2016 1435   PLT 268 01/27/2016 1435   MCV 91.4 01/27/2016 1435   MCH 30.3 01/27/2016 1435   MCHC 33.1 01/27/2016 1435   RDW 14.3 01/27/2016 1435   LYMPHSABS 1,428 01/27/2016 1435   MONOABS 756 01/27/2016 1435   EOSABS 252 01/27/2016 1435   BASOSABS 84 01/27/2016 1435    BMET    Component Value Date/Time   NA 146 (H) 01/10/2018 1512   K 3.9 01/10/2018 1512   CL 105 01/10/2018 1512   CO2 27 01/10/2018 1512   GLUCOSE 98 01/10/2018 1512   GLUCOSE 94 01/27/2016 1435   BUN 11 01/10/2018 1512   CREATININE 0.82 01/10/2018 1512   CREATININE 0.87 01/27/2016 1435   CALCIUM 9.2 01/10/2018 1512   GFRNONAA 71 01/10/2018 1512   GFRNONAA 67 01/29/2015 1354   GFRAA 82 01/10/2018 1512   GFRAA 78 01/29/2015 1354    BNP No results found for: BNP  ProBNP No results found for: PROBNP  Imaging: ECHOCARDIOGRAM COMPLETE  Result Date: 01/02/2020    ECHOCARDIOGRAM REPORT   Patient Name:   Denice Paradise ANN D Haston Date of Exam: 01/02/2020 Medical Rec #:  [id number]          Height:        67.6 in Accession #:    [id number]         Weight:       160.4 lb Date of Birth:  22-Sep-1940         BSA:          1.852 m Patient Age:    74 years           BP:           168/81 mmHg Patient Gender: F                  HR:           73 bpm. Exam Location:  [facility] Procedure: 2D Echo, Cardiac Doppler and Color Doppler Indications:    R06.00 SOB  History:        Patient has no prior history of Echocardiogram examinations.                 COPD and Lower extremity swelling.  Sonographer:    Marygrace Drought RCS Referring Phys: [id number] [place]  1. Left ventricular ejection fraction, by estimation, is 55 to 60%. The left ventricle has normal function. The left ventricle has no regional wall motion abnormalities. Left ventricular diastolic parameters are indeterminate.  2. Right ventricular systolic function is normal. The right ventricular size is normal. There is normal pulmonary artery systolic pressure.  3. The mitral valve is grossly normal. Trivial mitral valve regurgitation. No evidence of mitral stenosis.  4. The aortic valve is grossly normal. Aortic valve regurgitation is mild. Mild aortic valve sclerosis is present, with no evidence of aortic valve stenosis.  5. The inferior vena cava is normal in size with greater than 50% respiratory variability, suggesting right atrial pressure of 3 mmHg. Comparison(s): No prior Echocardiogram. Conclusion(s)/Recommendation(s): Difficult images. There is a prominent intra-atrial  septum with a linear structure extending into the right atrium. Not well visualized on this study. Recommend TEE for further evaluation. FINDINGS  Left Ventricle: Left ventricular ejection fraction, by estimation, is 55 to 60%. The left ventricle has normal function. The left ventricle has no regional wall motion abnormalities. The left ventricular internal cavity size was normal in size. There is  no left ventricular hypertrophy. Left ventricular diastolic parameters are  indeterminate. Right Ventricle: The right ventricular size is normal. No increase in right ventricular wall thickness. Right ventricular systolic function is normal. There is normal pulmonary artery systolic pressure. The tricuspid regurgitant velocity is 2.47 m/s, and  with an assumed right atrial pressure of 3 mmHg, the estimated right ventricular systolic pressure is 27.4 mmHg. Left Atrium: Left atrial size was not well visualized. Right Atrium: Right atrial size was not well visualized. Pericardium: There is no evidence of pericardial effusion. Presence of pericardial fat pad. Mitral Valve: The mitral valve is grossly normal. Mild mitral annular calcification. Trivial mitral valve regurgitation. No evidence of mitral valve stenosis. Tricuspid Valve: The tricuspid valve is normal in structure. Tricuspid valve regurgitation is trivial. No evidence of tricuspid stenosis. Aortic Valve: The aortic valve is grossly normal.. There is mild thickening and mild calcification of the aortic valve. Aortic valve regurgitation is mild. Aortic regurgitation PHT measures 245 msec. Mild aortic valve sclerosis is present, with no evidence of aortic valve stenosis. There is mild thickening of the aortic valve. There is mild calcification of the aortic valve. Aortic valve mean gradient measures 8.3 mmHg. Aortic valve peak gradient measures 14.5 mmHg. Aortic valve area, by VTI measures 1.80 cm. Pulmonic Valve: The pulmonic valve was not well visualized. Pulmonic valve regurgitation is not visualized. No evidence of pulmonic stenosis. Aorta: The aortic root, ascending aorta and aortic arch are all structurally normal, with no evidence of dilitation or obstruction. Venous: The inferior vena cava is normal in size with greater than 50% respiratory variability, suggesting right atrial pressure of 3 mmHg. IAS/Shunts: The atrial septum is grossly normal.  LEFT VENTRICLE PLAX 2D LVIDd:         4.18 cm  Diastology LVIDs:         2.84 cm  LV  e' lateral:   5.87 cm/s LV PW:         0.76 cm  LV E/e' lateral: 15.8 LV IVS:        0.95 cm  LV e' medial:    7.62 cm/s LVOT diam:     2.00 cm  LV E/e' medial:  12.2 LV SV:         72 LV SV Index:   39 LVOT Area:     3.14 cm  RIGHT VENTRICLE RV S prime:     13.10 cm/s TAPSE (M-mode): 2.1 cm LEFT ATRIUM             Index       RIGHT ATRIUM           Index LA diam:        3.50 cm 1.89 cm/m  RA Area:     10.30 cm LA Vol (A2C):   27.5 ml 14.85 ml/m RA Volume:   19.60 ml  10.58 ml/m LA Vol (A4C):   32.7 ml 17.65 ml/m LA Biplane Vol: 29.9 ml 16.14 ml/m  AORTIC VALVE AV Area (Vmax):    1.98 cm AV Area (Vmean):   1.84 cm AV Area (VTI):     1.80 cm AV Vmax:  190.33 cm/s AV Vmean:          125.467 cm/s AV VTI:            0.399 m AV Peak Grad:      14.5 mmHg AV Mean Grad:      8.3 mmHg LVOT Vmax:         120.00 cm/s LVOT Vmean:        73.500 cm/s LVOT VTI:          0.229 m LVOT/AV VTI ratio: 0.57 AI PHT:            245 msec  AORTA Ao Root diam: 2.70 cm MITRAL VALVE                TRICUSPID VALVE MV Area (PHT):              TR Peak grad:   24.4 mmHg MV Decel Time:              TR Vmax:        247.00 cm/s MV E velocity: 92.60 cm/s MV A velocity: 114.00 cm/s  SHUNTS MV E/A ratio:  0.81         Systemic VTI:  0.23 m                             Systemic Diam: 2.00 cm Jodelle RedBridgette Christopher MD Electronically signed by Jodelle RedBridgette Christopher MD Signature Date/Time: 01/02/2020/8:07:29 PM    Final       PFT Results Latest Ref Rng & Units 07/17/2015  FVC-Pre L 1.83  FVC-Predicted Pre % 55  FVC-Post L 2.45  FVC-Predicted Post % 74  Pre FEV1/FVC % % 36  Post FEV1/FCV % % 31  FEV1-Pre L 0.66  FEV1-Predicted Pre % 26  FEV1-Post L 0.75  TLC L 10.56  TLC % Predicted % 192  RV % Predicted % 358    No results found for: NITRICOXIDE      Assessment & Plan:   COPD (chronic obstructive pulmonary disease) with emphysema (HCC) Currently stable on present regimen Chest x-ray today  Plan  Patient  Instructions  Continue on Symbicort and Spiriva . Rinse after use.  Duoneb Three times a day  .  Need to increased activity as tolerated. Call back if you change your mind on pulmonary rehab.  Oxygen 2l/m with activity and At bedtime  .  Chest xray today  Follow up in 4 months with Dr. Everardo AllEllison and As needed   Please contact office for sooner follow up if symptoms do not improve or worsen or seek emergency care         Chronic respiratory failure with hypoxia (HCC) Continue on oxygen 2 L with activity and at bedtime O2 saturation goal greater than 90%. Recommend pulmonary rehab however she wants to try to do activity on her own.  Of advised to advance slowly.  As she has significant physical deconditioning. Plan  Patient Instructions  Continue on Symbicort and Spiriva . Rinse after use.  Duoneb Three times a day  .  Need to increased activity as tolerated. Call back if you change your mind on pulmonary rehab.  Oxygen 2l/m with activity and At bedtime  .  Chest xray today  Follow up in 4 months with Dr. Everardo AllEllison and As needed   Please contact office for sooner follow up if symptoms do not improve or worsen or seek emergency care  Rubye Oaks, NP 01/21/2020

## 2020-01-21 NOTE — Assessment & Plan Note (Addendum)
Currently stable on present regimen Chest x-ray today  Plan  Patient Instructions  Continue on Symbicort and Spiriva . Rinse after use.  Duoneb Three times a day  .  Need to increased activity as tolerated. Call back if you change your mind on pulmonary rehab.  Oxygen 2l/m with activity and At bedtime  .  Chest xray today  Follow up in 4 months with Dr. Everardo All and As needed   Please contact office for sooner follow up if symptoms do not improve or worsen or seek emergency care

## 2020-01-25 NOTE — Progress Notes (Signed)
Note reviewed and agree with plan as noted below.  Mechele Collin, M.D. [facility] Pulmonary/Critical Care Medicine 01/25/2020 4:59 PM

## 2020-01-29 ENCOUNTER — Telehealth: Payer: Self-pay | Admitting: Adult Health

## 2020-01-29 ENCOUNTER — Encounter: Payer: Self-pay | Admitting: *Deleted

## 2020-01-29 ENCOUNTER — Other Ambulatory Visit: Payer: Self-pay | Admitting: Physician Assistant

## 2020-01-29 DIAGNOSIS — E559 Vitamin D deficiency, unspecified: Secondary | ICD-10-CM

## 2020-01-29 NOTE — Telephone Encounter (Signed)
Spoke with pt. She was calling back in regards to her CXR results. Pt has been given her results. Nothing further was needed.

## 2020-02-04 ENCOUNTER — Telehealth: Payer: Self-pay | Admitting: Cardiovascular Disease

## 2020-02-04 NOTE — Telephone Encounter (Signed)
New Message ° ° °Pt is calling and says her daughter will need to assist her to her appt because she uses a wheel chair  ° ° °Please advise  °

## 2020-02-04 NOTE — Telephone Encounter (Signed)
I spoke to the patient and she will need to have her daughter accompany her to the appointment, because of mobility issues.

## 2020-02-04 NOTE — Progress Notes (Signed)
Cardiology Office Note:   Date:  02/07/2020  NAME:  Rachel Perez    MRN: [id number] DOB:  [birth year]   PCP:  Mancel Bale, PA-C  Cardiologist:  No primary care provider on file.   Referring MD: Mancel Bale, PA-C   Chief Complaint  Patient presents with  . New Patient (Initial Visit)   History of Present Illness:   Rachel Perez is a 79 y.o. female with a hx of COPD, anxiety who is being seen today for the evaluation of abnormal echocardiogram at the request of Weber, Damaris Hippo, PA-C. Echo recently ordered for SOB. Reading physician commented on a linear structure in the RA.  On my review this likely represents eustachian valve versus a Chiari network.  Given the linear nature of it is likely eustachian valve.  This could also represent artifact.  Surprisingly she has no symptoms of this.  This is unlikely be a thrombus or any sort of vegetation.  She has no symptoms of endocarditis.  She has very severe COPD.  Most recent PFTs in 2016 to demonstrate this as well.  She is on oxygen continuously.  She does report she not that active but certain activities such as walking around her house to get her quite short of breath.  She reports that time she is noted her heart rate does increase when she exerts herself.  Heart rate does come back down per her report.  She is followed by pulmonology.  Her echocardiogram demonstrates no evidence of heart failure with preserved ejection fraction.  She does have some lower extremity edema but it appears that she is an active and this probably explains that.  She never had a BNP value.  She denies any chest pain or chest pressure when she exerts herself.  She is a former smoker with nearly 40 to 50 pack years of smoking.  She did quit a number of years ago.  She is on several inhalers.  Main issues appear to be pulmonary from what I can tell.  Her EKG today demonstrates normal sinus rhythm with nonspecific ST-T changes and no evidence of arrhythmia or  prior infarction.  There are no ischemic changes either.  Past Medical History: Past Medical History:  Diagnosis Date  . Anxiety   . Arthritis   . Cataract   . COPD (chronic obstructive pulmonary disease) ([place])   . Depression   . Emphysema of lung ([place])   . H/O bladder infections   . Osteoporosis   . Thyroid disease     Past Surgical History: Past Surgical History:  Procedure Laterality Date  . ABDOMINAL HYSTERECTOMY      Current Medications: Current Meds  Medication Sig  . acidophilus (RISAQUAD) CAPS capsule Take 1 capsule by mouth daily.  Marland Kitchen alendronate (FOSAMAX) 70 MG tablet TAKE 1 TABLET BY MOUTH EVERY 7 DAYS WITH A FULL GLASS OF WATER ON AN EMPTY STOMACH  . ALPRAZolam (XANAX) 0.5 MG tablet Take 1 tablet (0.5 mg total) by mouth at bedtime as needed.  . budesonide-formoterol (SYMBICORT) 160-4.5 MCG/ACT inhaler Inhale 2 puffs into the lungs 2 (two) times daily.  . Calcium Carbonate (CALCIUM 500 PO) Take 500 Units by mouth 3 (three) times daily.  . Cholecalciferol (VITAMIN D) 2000 UNITS tablet Take 6,000 Units by mouth daily.   . clindamycin (CLEOCIN) 300 MG capsule Take 300 mg by mouth 3 (three) times daily.  . Cranberry 300 MG tablet Take 300 mg by mouth 2 (two) times  daily.  . dextromethorphan-guaiFENesin (MUCINEX DM) 30-600 MG 12hr tablet Take 1 tablet by mouth 2 (two) times daily.  Marland Kitchen ipratropium-albuterol (DUONEB) 0.5-2.5 (3) MG/3ML SOLN 1 vial tid  . Multiple Vitamin (MULTIVITAMIN) tablet Take 1 tablet by mouth daily.  Marland Kitchen PROAIR HFA 108 (90 Base) MCG/ACT inhaler Inhale 1-2 puffs into the lungs every 6 (six) hours as needed for wheezing or shortness of breath.  Marland Kitchen Respiratory Therapy Supplies (FLUTTER) DEVI Use as directed  . Spacer/Aero-Holding Chambers (AEROCHAMBER MV) inhaler Use as instructed  . tiotropium (SPIRIVA HANDIHALER) 18 MCG inhalation capsule Place 1 capsule (18 mcg total) into inhaler and inhale daily.     Allergies:    Celebrex [celecoxib], Levaquin  [levofloxacin], and Penicillins   Social History: Social History   Socioeconomic History  . Marital status: Divorced    Spouse name: n/a  . Number of children: 2  . Years of education: 13+  . Highest education level: Not on file  Occupational History  . Occupation: retired-bookkeeping    Employer: RETIRED  Tobacco Use  . Smoking status: Former Smoker    Packs/day: 0.50    Years: 40.00    Pack years: 20.00    Types: Cigarettes    Quit date: 03/30/2013    Years since quitting: 6.8  . Smokeless tobacco: Never Used  . Tobacco comment: QUIT 03/30/13  Substance and Sexual Activity  . Alcohol use: Not Currently    Alcohol/week: 1.0 standard drinks    Types: 1 Standard drinks or equivalent per week    Comment: mixed drinks -daily  . Drug use: No  . Sexual activity: Never  Other Topics Concern  . 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   Children - 2   Grandchildren - 4 - 3 in college   Exercise - walking - daily 10 mins - 2-3 x/day - walks with oxygen and a walker for weakness and to carry her oxygen   Divorced after 30 years of marriage.   Social Determinants of Health   Financial Resource Strain:   . Difficulty of Paying Living Expenses:   Food Insecurity:   . Worried About Programme researcher, broadcasting/film/video in the Last Year:   . Barista in the Last Year:   Transportation Needs:   . Freight forwarder (Medical):   Marland Kitchen Lack of Transportation (Non-Medical):   Physical Activity:   . Days of Exercise per Week:   . Minutes of Exercise per Session:   Stress:   . Feeling of Stress :   Social Connections:   . Frequency of Communication with Friends and Family:   . Frequency of Social Gatherings with Friends and Family:   . Attends Religious Services:   . Active Member of Clubs or Organizations:   . Attends Banker Meetings:   Marland Kitchen Marital Status:      Family History: The patient's family  history includes Cancer in her father and maternal grandmother; Dementia in her mother; Stroke in her maternal grandfather; Thyroid disease in her daughter and sister.  ROS:   All other ROS reviewed and negative. Pertinent positives noted in the HPI.     EKGs/Labs/Other Studies Reviewed:   The following studies were personally reviewed by me today:  EKG:  EKG is ordered today.  The ekg ordered today demonstrates normal sinus rhythm, heart rate 83, nonspecific ST-T changes, no evidence of prior infarction, and  was personally reviewed by me.   TTE 01/02/2020 1. Left ventricular ejection fraction, by estimation, is 55 to 60%. The  left ventricle has normal function. The left ventricle has no regional  wall motion abnormalities. Left ventricular diastolic parameters are  indeterminate.  2. Right ventricular systolic function is normal. The right ventricular  size is normal. There is normal pulmonary artery systolic pressure.  3. The mitral valve is grossly normal. Trivial mitral valve  regurgitation. No evidence of mitral stenosis.  4. The aortic valve is grossly normal. Aortic valve regurgitation is  mild. Mild aortic valve sclerosis is present, with no evidence of aortic  valve stenosis.  5. The inferior vena cava is normal in size with greater than 50%  respiratory variability, suggesting right atrial pressure of 3 mmHg.   Comparison(s): No prior Echocardiogram.   Conclusion(s)/Recommendation(s): Difficult images. There is a prominent  intra-atrial septum with a linear structure extending into the right  atrium. Not well visualized on this study. Recommend TEE for further  evaluation.   Recent Labs: No results found for requested labs within last 8760 hours.   Recent Lipid Panel    Component Value Date/Time   CHOL 157 01/10/2018 1512   TRIG 55 01/10/2018 1512   HDL 65 01/10/2018 1512   CHOLHDL 2.4 01/10/2018 1512   CHOLHDL 3.3 11/28/2013 1402   VLDL 29 11/28/2013 1402    LDLCALC 81 01/10/2018 1512    Physical Exam:   VS:  BP (!) 122/54   Pulse 93   Ht 5\' 7"  (1.702 m)   Wt 145 lb 9.6 oz (66 kg)   SpO2 94%   BMI 22.80 kg/m    Wt Readings from Last 3 Encounters:  02/07/20 145 lb 9.6 oz (66 kg)  01/21/20 157 lb (71.2 kg)  02/02/18 160 lb 6.4 oz (72.8 kg)    General: Well nourished, well developed, in no acute distress Heart: Atraumatic, normal size  Eyes: PEERLA, EOMI  Neck: Supple, no JVD Endocrine: No thryomegaly Cardiac: Normal S1, S2; RRR; no murmurs, rubs, or gallops Lungs: Diminished breath sounds bilaterally Abd: Soft, nontender, no hepatomegaly  Ext: Trace edema, venous insufficiency changes Musculoskeletal: No deformities, BUE and BLE strength normal and equal Skin: Warm and dry, no rashes   Neuro: Alert and oriented to person, place, time, and situation, CNII-XII grossly intact, no focal deficits  Psych: Normal mood and affect   ASSESSMENT:   Leahann Lempke is a 79 y.o. female who presents for the following: 1. Abnormal echocardiogram   2. SOB (shortness of breath)   3. Venous insufficiency     PLAN:   1. Abnormal echocardiogram -I did review her echocardiogram.  This likely represents eustachian valve versus a Chiari network.  There is no evidence of thrombus or anything concerning.  This could also be artifact.  I see no need to put her through a TEE.  She does have severe COPD and on chronic oxygen therapy.  This will be difficult anyway.  Unless we had a really strong suspicion of pathology we would not pursue any further testing.  2. SOB (shortness of breath) -Profound short of breath.  Severe COPD.  EKG with normal sinus rhythm and no evidence of ST-T changes concerning for ischemia.  No evidence of prior infarction.  Denies any chest pain or pressure.  She will be evaluated by pulmonology.  Should they think she needs a stress test or her symptoms are out of proportion we will pursue that.  I have recommended a BNP to  exclude congestive heart failure.  I think her lower extremity edema is related to inactivity as well as venous insufficiency.  Her echocardiogram is inconsistent with any significant diastolic heart failure.  Her IVC is collapsible.  3. Venous insufficiency -Changes represent a venous insufficiency noted on exam.  I recommended compression stockings and leg elevation.  She will do this.  Disposition: Return if symptoms worsen or fail to improve.  Medication Adjustments/Labs and Tests Ordered: Current medicines are reviewed at length with the patient today.  Concerns regarding medicines are outlined above.  Orders Placed This Encounter  Procedures  . Brain natriuretic peptide  . EKG 12-Lead   No orders of the defined types were placed in this encounter.   Patient Instructions  Medication Instructions:  The current medical regimen is effective;  continue present plan and medications.  *If you need a refill on your cardiac medications before your next appointment, please call your pharmacy*   Lab Work: BNP today   If you have labs (blood work) drawn today and your tests are completely normal, you will receive your results only by: Marland Kitchen MyChart Message (if you have MyChart) OR . A paper copy in the mail If you have any lab test that is abnormal or we need to change your treatment, we will call you to review the results.    Follow-Up: At [facility], you and your health needs are our priority.  As part of our continuing mission to provide you with exceptional heart care, we have created designated Provider Care Teams.  These Care Teams include your primary Cardiologist (physician) and Advanced Practice Providers (APPs -  Physician Assistants and Nurse Practitioners) who all work together to provide you with the care you need, when you need it.  We recommend signing up for the patient portal called "MyChart".  Sign up information is provided on this After Visit Summary.  MyChart is  used to connect with patients for Virtual Visits (Telemedicine).  Patients are able to view lab/test results, encounter notes, upcoming appointments, etc.  Non-urgent messages can be sent to your provider as well.   To learn more about what you can do with MyChart, go to [email or web address].    Your next appointment:   As needed  The format for your next appointment:   In Person  Provider:   Lennie Odor, MD        Signed, Lenna Gilford. Flora Lipps, MD [facility]  [address], [address] [place], [place] [address] [phone]  02/07/2020 4:46 PM

## 2020-02-07 ENCOUNTER — Other Ambulatory Visit: Payer: Self-pay

## 2020-02-07 ENCOUNTER — Ambulatory Visit: Payer: Medicare Other | Admitting: Cardiovascular Disease

## 2020-02-07 ENCOUNTER — Encounter: Payer: Self-pay | Admitting: Cardiovascular Disease

## 2020-02-07 VITALS — BP 122/54 | HR 93 | Ht 67.0 in | Wt 145.6 lb

## 2020-02-07 DIAGNOSIS — R0602 Shortness of breath: Secondary | ICD-10-CM | POA: Diagnosis not present

## 2020-02-07 DIAGNOSIS — I872 Venous insufficiency (chronic) (peripheral): Secondary | ICD-10-CM

## 2020-02-07 DIAGNOSIS — R931 Abnormal findings on diagnostic imaging of heart and coronary circulation: Secondary | ICD-10-CM | POA: Diagnosis not present

## 2020-02-07 NOTE — Patient Instructions (Signed)
Medication Instructions:  The current medical regimen is effective;  continue present plan and medications.  *If you need a refill on your cardiac medications before your next appointment, please call your pharmacy*   Lab Work: BNP today   If you have labs (blood work) drawn today and your tests are completely normal, you will receive your results only by: . MyChart Message (if you have MyChart) OR . A paper copy in the mail If you have any lab test that is abnormal or we need to change your treatment, we will call you to review the results.   Follow-Up: At [facility], you and your health needs are our priority.  As part of our continuing mission to provide you with exceptional heart care, we have created designated Provider Care Teams.  These Care Teams include your primary Cardiologist (physician) and Advanced Practice Providers (APPs -  Physician Assistants and Nurse Practitioners) who all work together to provide you with the care you need, when you need it.  We recommend signing up for the patient portal called "MyChart".  Sign up information is provided on this After Visit Summary.  MyChart is used to connect with patients for Virtual Visits (Telemedicine).  Patients are able to view lab/test results, encounter notes, upcoming appointments, etc.  Non-urgent messages can be sent to your provider as well.   To learn more about what you can do with MyChart, go to [email or web address].    Your next appointment:   As needed  The format for your next appointment:   In Person  Provider:   Winchester O'Neal, MD      

## 2020-02-08 LAB — BRAIN NATRIURETIC PEPTIDE: BNP: 15.3 pg/mL (ref 0.0–100.0)

## 2020-02-18 ENCOUNTER — Telehealth: Payer: Self-pay | Admitting: Adult Health

## 2020-02-18 NOTE — Telephone Encounter (Signed)
Called and spoke with pt who stated At the end of April 2021. Pt stated this was mentioned at OV with TP 5/10. Pt said at last OV she was qualified for O2.  Pt said when walking around the house, sats will go as low as 78% on O2 but then will immediately go back up to low 90s. Asked pt if she had ever bumped O2 up higher than 2L when sats dropped low and she stated she has been keeping it at 2L.  Pt states she has been on clindamycin since 5/7 as she states she has had an infection that she has not been able to get rid of. She stated that she has had a lot of teeth pulled and has been told by dentist that she needs to have more dental work done. She is not sure if what has been going on  Asked pt if she has been running a temp and she stated no that temp has been ranging 97.1-98.0  Pt denies any complaints of cough as the mucinex she is taking has been really helping to control a cough. Pt will have an occ cough but none of any concern. Pt denies any complaints of wheezing. States she does have problems with dryness in mouth as she has been doing some mouth breathing.   Pt states she has been having to use her proair inhaler at least 6-8 times daily. She states she is also using her spiriva and symbicort inhalers daily as prescribed. Pt has been using her nebulizer about every 3-4 hours.  Pt wants to know what we recommend to help with her symptoms. Tammy, please advise.

## 2020-02-18 NOTE — Telephone Encounter (Signed)
Spoke with pt. She has been scheduled to see Tammy on 02/20/2020. Pt did not want to go to PCP or an urgent care. Advised her to call us if things got worse with her oxygen levels.

## 2020-02-18 NOTE — Telephone Encounter (Signed)
Needs ov if sats dropping 78% on 2l/m , she was 98% last ov  That is new and also using albuterol 8 times a day  Not sure what openings are available . I am in [place] today , if no openings will need to go to PCP or urgent care/ER    Please contact office for sooner follow up if symptoms do not improve or worsen or seek emergency care

## 2020-02-20 ENCOUNTER — Ambulatory Visit: Payer: Medicare Other | Admitting: Adult Health

## 2020-02-20 ENCOUNTER — Encounter: Payer: Self-pay | Admitting: Adult Health

## 2020-02-20 ENCOUNTER — Other Ambulatory Visit: Payer: Self-pay

## 2020-02-20 DIAGNOSIS — J9611 Chronic respiratory failure with hypoxia: Secondary | ICD-10-CM | POA: Diagnosis not present

## 2020-02-20 DIAGNOSIS — J432 Centrilobular emphysema: Secondary | ICD-10-CM | POA: Diagnosis not present

## 2020-02-20 NOTE — Progress Notes (Signed)
@Patient  ID: , female    DOB: Feb 20, 1941, 79 y.o.   MRN: [id number]  No chief complaint on file.   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  November 2016 pulmonary function testing ratio 31%, FEV1 0.75 L (29% predicted, 12% change but not significant), total lung capacity 10.56 L (192% predicted, residual volume 8.54 L (358% predicted), DLCO 8.62 (30% predicted)  Sleep study: Sleep study in November 2017 showed nocturnal hypoxemia (82% on 2L) but no evidence of obstructive sleep apnea.  02/20/2020 Follow up : COPD , O2 RF  Patient returns for follow up . Complains that has been checking her oxygen levels at home and notices they go down into the low 80s with activity and rest. No increased cough or congestion  She is Oxygen 2l/m with activity and At bedtime .  Today in the office we check her oxygen with her pulse ox and our pulse ox with no drops in oxygen on room air .  92-94% on room air .  Remains on symbicort and spiriva . Uses duoneb Three times a day   Is sedentary gets winded with minimal activity   Allergies  Allergen Reactions   Celebrex [Celecoxib]     Tendonitis   Levaquin [Levofloxacin]     Tendonitis    Penicillins Other (See Comments)    psorasis    Immunization History  Administered Date(s) Administered   Influenza Split 11/03/2011, 06/21/2013   Influenza, High Dose Seasonal PF 06/07/2016, 06/14/2017, 06/13/2018, 07/07/2018, 06/21/2019   Influenza,inj,Quad PF,6+ Mos 10/15/2014, 06/26/2015   Moderna SARS-COVID-2 Vaccination 09/26/2019, 10/24/2019   Pneumococcal Conjugate-13 10/15/2014   Pneumococcal Polysaccharide-23 09/10/2009   Pneumococcal-Unspecified 09/10/2009   Td  09/10/2009, 01/18/2020   Zoster 01/22/2010    Past Medical History:  Diagnosis Date   Anxiety    Arthritis    Cataract    COPD (chronic obstructive pulmonary disease) ([facility])    Depression    Emphysema of lung ([facility])    H/O bladder infections    Osteoporosis    Thyroid disease     Tobacco History: Social History   Tobacco Use  Smoking Status Former Smoker   Packs/day: 0.50   Years: 40.00   Pack years: 20.00   Types: Cigarettes   Quit date: 03/30/2013   Years since quitting: 6.8  Smokeless Tobacco Never Used  Tobacco Comment   QUIT 03/30/13   Counseling given: Not Answered Comment: QUIT 03/30/13   Outpatient Medications Prior to Visit  Medication Sig Dispense Refill   acidophilus (RISAQUAD) CAPS capsule Take 1 capsule by mouth daily.     alendronate (FOSAMAX) 70 MG tablet TAKE 1 TABLET BY MOUTH EVERY 7 DAYS WITH A FULL GLASS OF WATER ON AN EMPTY STOMACH 4 tablet 11   ALPRAZolam (XANAX) 0.5 MG tablet Take 1 tablet (0.5 mg total) by mouth at bedtime as needed. 30 tablet 0   budesonide-formoterol (SYMBICORT) 160-4.5 MCG/ACT inhaler Inhale 2 puffs into the lungs 2 (two) times daily. 1 Inhaler 11   Calcium Carbonate (CALCIUM 500 PO) Take 500 Units by mouth 3 (three) times daily.     Cholecalciferol (VITAMIN D) 2000 UNITS tablet Take 4,000 Units by mouth daily.      Cranberry 300 MG tablet Take 300  mg by mouth 2 (two) times daily.     dextromethorphan-guaiFENesin (MUCINEX DM) 30-600 MG 12hr tablet Take 1 tablet by mouth daily.      ipratropium-albuterol (DUONEB) 0.5-2.5 (3) MG/3ML SOLN 1 vial tid (Patient taking differently: Take 3 mLs by nebulization in the morning, at noon, and at bedtime. ) 270 mL 11   Multiple Vitamin (MULTIVITAMIN) tablet Take 1 tablet by mouth daily.     PROAIR HFA 108 (90 Base) MCG/ACT inhaler Inhale 1-2 puffs into the lungs every 6 (six) hours as needed for wheezing or shortness of breath. 2 Inhaler 11   Respiratory Therapy  Supplies (FLUTTER) DEVI Use as directed 1 each 0   Spacer/Aero-Holding Chambers (AEROCHAMBER MV) inhaler Use as instructed 1 each 0   tiotropium (SPIRIVA HANDIHALER) 18 MCG inhalation capsule Place 1 capsule (18 mcg total) into inhaler and inhale daily. 30 capsule 11   clindamycin (CLEOCIN) 300 MG capsule Take 300 mg by mouth 3 (three) times daily.     No facility-administered medications prior to visit.     Review of Systems:   Constitutional:   No  weight loss, night sweats,  Fevers, chills,  +fatigue, or  lassitude.  HEENT:   No headaches,  Difficulty swallowing,  Tooth/dental problems, or  Sore throat,                No sneezing, itching, ear ache, nasal congestion, post nasal drip,   CV:  No chest pain,  Orthopnea, PND, swelling in lower extremities, anasarca, dizziness, palpitations, syncope.   GI  No heartburn, indigestion, abdominal pain, nausea, vomiting, diarrhea, change in bowel habits, loss of appetite, bloody stools.   Resp: No chest wall deformity  Skin: no rash or lesions.  GU: no dysuria, change in color of urine, no urgency or frequency.  No flank pain, no hematuria   MS:  No joint pain or swelling.  No decreased range of motion.  No back pain.    Physical Exam  BP 130/90 (BP Location: Left Arm, Cuff Size: Normal)    Pulse 80    Wt 145 lb (65.8 kg)    SpO2 95%    BMI 22.71 kg/m   GEN: A/Ox3; pleasant , NAD, chronically ill appearing    HEENT:  [place]/AT,  , NOSE-clear, THROAT-clear, no lesions, no postnasal drip or exudate noted.   NECK:  Supple w/ fair ROM; no JVD; normal carotid impulses w/o bruits; no thyromegaly or nodules palpated; no lymphadenopathy.    RESP  Decreased BS in bases  no accessory muscle use, no dullness to percussion  CARD:  RRR, no m/r/g, no peripheral edema, pulses intact, no cyanosis or clubbing.  GI:   Soft & nt; nml bowel sounds; no organomegaly or masses detected.   Musco: Warm bil, no deformities or joint swelling noted.    Neuro: alert, no focal deficits noted.    Skin: Warm, no lesions or rashes    Lab Results:  CBC    Component Value Date/Time   WBC 8.4 01/27/2016 1435   RBC 4.66 01/27/2016 1435   HGB 14.1 01/27/2016 1435   HCT 42.6 01/27/2016 1435   PLT 268 01/27/2016 1435   MCV 91.4 01/27/2016 1435   MCH 30.3 01/27/2016 1435   MCHC 33.1 01/27/2016 1435   RDW 14.3 01/27/2016 1435   LYMPHSABS 1,428 01/27/2016 1435   MONOABS 756 01/27/2016 1435   EOSABS 252 01/27/2016 1435   BASOSABS 84 01/27/2016 1435    BMET    Component  Value Date/Time   NA 146 (H) 01/10/2018 1512   K 3.9 01/10/2018 1512   CL 105 01/10/2018 1512   CO2 27 01/10/2018 1512   GLUCOSE 98 01/10/2018 1512   GLUCOSE 94 01/27/2016 1435   BUN 11 01/10/2018 1512   CREATININE 0.82 01/10/2018 1512   CREATININE 0.87 01/27/2016 1435   CALCIUM 9.2 01/10/2018 1512   GFRNONAA 71 01/10/2018 1512   GFRNONAA 67 01/29/2015 1354   GFRAA 82 01/10/2018 1512   GFRAA 78 01/29/2015 1354    BNP    Component Value Date/Time   BNP 15.3 02/07/2020 1131    ProBNP No results found for: PROBNP  Imaging: DG Chest 2 View  Result Date: 01/21/2020 CLINICAL DATA:  COPD.  Centrilobular emphysema. EXAM: CHEST - 2 VIEW COMPARISON:  10/26/2017 FINDINGS: Heart size remains normal. Aortic atherosclerosis as seen previously. Pulmonary hyperinflation, similar to the previous study, with coarsened interstitial markings consistent with the clinical diagnosis of emphysema. There is also some central bronchial thickening that could go along with some bronchitis. No consolidation, collapse or effusion. No abnormal bone finding. IMPRESSION: Similar to the previous study. Pulmonary hyperinflation with increased interstitial markings and central bronchial thickening consistent with emphysema and bronchitis. No consolidation or collapse. Electronically Signed   By: Nelson Chimes M.D.   On: 01/21/2020 17:25      PFT Results Latest Ref Rng & Units 07/17/2015   FVC-Pre L 1.83  FVC-Predicted Pre % 55  FVC-Post L 2.45  FVC-Predicted Post % 74  Pre FEV1/FVC % % 36  Post FEV1/FCV % % 31  FEV1-Pre L 0.66  FEV1-Predicted Pre % 26  FEV1-Post L 0.75  TLC L 10.56  TLC % Predicted % 192  RV % Predicted % 358    No results found for: NITRICOXIDE      Assessment & Plan:   No problem-specific Assessment & Plan notes found for this encounter.     Rexene Edison, NP 02/20/2020

## 2020-02-20 NOTE — Patient Instructions (Addendum)
Continue on Symbicort and Spiriva . Rinse after use.  Duoneb Three times a day  .  Need to increased activity as tolerated. Call back if you change your mind on pulmonary rehab.  Oxygen 2l/m with activity/exercise and As needed   and At bedtime  . Goal is to have oxygen level >88-90%.  Follow up in 4 months with Dr. Everardo All and As needed   Please contact office for sooner follow up if symptoms do not improve or worsen or seek emergency care

## 2020-02-21 NOTE — Assessment & Plan Note (Signed)
No desats on room air . O2 sats adequate on 2/lm with activity and At bedtime   Goal is to keep O2 sats >88-90%.   Plan  Patient Instructions  Continue on Symbicort and Spiriva . Rinse after use.  Duoneb Three times a day  .  Need to increased activity as tolerated. Call back if you change your mind on pulmonary rehab.  Oxygen 2l/m with activity/exercise and As needed   and At bedtime  . Goal is to have oxygen level >88-90%.  Follow up in 4 months with Dr. Everardo All and As needed   Please contact office for sooner follow up if symptoms do not improve or worsen or seek emergency care

## 2020-02-21 NOTE — Assessment & Plan Note (Signed)
GOLD D COPD -oxygen dependent . -appears stable with no increased oxygen demands from baseline.   Plan  Patient Instructions  Continue on Symbicort and Spiriva . Rinse after use.  Duoneb Three times a day  .  Need to increased activity as tolerated. Call back if you change your mind on pulmonary rehab.  Oxygen 2l/m with activity/exercise and As needed   and At bedtime  . Goal is to have oxygen level >88-90%.  Follow up in 4 months with Dr. Everardo All and As needed   Please contact office for sooner follow up if symptoms do not improve or worsen or seek emergency care

## 2020-02-29 ENCOUNTER — Other Ambulatory Visit: Payer: Self-pay | Admitting: Physician Assistant

## 2020-02-29 DIAGNOSIS — E559 Vitamin D deficiency, unspecified: Secondary | ICD-10-CM

## 2020-04-01 ENCOUNTER — Telehealth: Payer: Self-pay | Admitting: Adult Health

## 2020-04-01 MED ORDER — SPIRIVA RESPIMAT 2.5 MCG/ACT IN AERS: 2.0000 | INHALATION_SPRAY | Freq: Every day | RESPIRATORY_TRACT | 5 refills | Status: DC

## 2020-04-01 NOTE — Telephone Encounter (Signed)
Called and spoke with pt who stated she was given a sample of spiriva 2.5 at last OV. Pt was requesting to have an Rx sent to pharmacy. Verified pt's preferred pharmacy and sent Rx in for pt. Nothing further needed.

## 2020-04-01 NOTE — Telephone Encounter (Signed)
Pt called back, please return call  

## 2020-04-01 NOTE — Telephone Encounter (Signed)
Called and left message for patient to return call.  

## 2020-05-20 ENCOUNTER — Other Ambulatory Visit: Payer: Self-pay | Admitting: Physician Assistant

## 2020-05-20 DIAGNOSIS — E559 Vitamin D deficiency, unspecified: Secondary | ICD-10-CM

## 2020-05-20 DIAGNOSIS — M810 Age-related osteoporosis without current pathological fracture: Secondary | ICD-10-CM

## 2020-05-22 ENCOUNTER — Other Ambulatory Visit: Payer: Medicare Other

## 2020-07-08 ENCOUNTER — Ambulatory Visit: Payer: Medicare Other | Attending: Internal Medicine

## 2020-07-08 DIAGNOSIS — Z23 Encounter for immunization: Secondary | ICD-10-CM

## 2020-07-08 NOTE — Progress Notes (Signed)
   Covid-19 Vaccination Clinic  Name:  Blaine Guiffre    MRN: [id number] DOB: 05-04-1941  07/08/2020  Ms. Marro was observed post Covid-19 immunization for 15 minutes without incident. She was provided with Vaccine Information Sheet and instruction to access the V-Safe system.   Ms. Leitch was instructed to call 911 with any severe reactions post vaccine: Marland Kitchen Difficulty breathing  . Swelling of face and throat  . A fast heartbeat  . A bad rash all over body  . Dizziness and weakness

## 2020-07-16 ENCOUNTER — Other Ambulatory Visit: Payer: Self-pay | Admitting: Adult Health

## 2020-11-27 ENCOUNTER — Telehealth: Payer: Self-pay | Admitting: Adult Health

## 2020-11-27 NOTE — Telephone Encounter (Signed)
Call returned to [place] at [facility], confirmed patient DOB. Requesting to know if patient is allergic to spiriva. Made aware per our records patient is not allergic to spiriva and has been using spiriva via our samples and has not reported any issues. Voiced understanding.   Nothing further needed at this time.

## 2021-01-22 ENCOUNTER — Ambulatory Visit: Payer: Medicare Other | Attending: Internal Medicine

## 2021-01-22 ENCOUNTER — Other Ambulatory Visit: Payer: Self-pay

## 2021-01-22 ENCOUNTER — Other Ambulatory Visit (HOSPITAL_BASED_OUTPATIENT_CLINIC_OR_DEPARTMENT_OTHER): Payer: Self-pay

## 2021-01-22 DIAGNOSIS — Z23 Encounter for immunization: Secondary | ICD-10-CM

## 2021-01-22 MED ORDER — COVID-19 MRNA VACC (MODERNA) 100 MCG/0.5ML IM SUSP
INTRAMUSCULAR | 0 refills | Status: DC
  Filled 2021-01-22: qty 0.25, 1d supply, fill #0

## 2021-01-22 NOTE — Progress Notes (Signed)
   Covid-19 Vaccination Clinic  Name:  Rachel Perez    MRN: [id number] DOB: [birth year]  01/22/2021  Rachel Perez was observed post Covid-19 immunization for 15 minutes without incident. She was provided with Vaccine Information Sheet and instruction to access the V-Safe system.   Rachel Perez was instructed to call 911 with any severe reactions post vaccine: Marland Kitchen Difficulty breathing  . Swelling of face and throat  . A fast heartbeat  . A bad rash all over body  . Dizziness and weakness   Immunizations Administered    Name Date Dose VIS Date Route   Moderna Covid-19 Booster Vaccine 01/22/2021  2:22 PM 0.25 mL 07/02/2020 Intramuscular   Manufacturer: Moderna   Lot: [id number]   NDC: 12458-099-83

## 2021-03-24 ENCOUNTER — Other Ambulatory Visit: Payer: Self-pay | Admitting: Adult Health

## 2021-03-25 ENCOUNTER — Other Ambulatory Visit: Payer: Self-pay

## 2021-03-25 ENCOUNTER — Other Ambulatory Visit: Payer: Self-pay | Admitting: *Deleted

## 2021-03-25 MED ORDER — SPIRIVA RESPIMAT 2.5 MCG/ACT IN AERS: 2.0000 | INHALATION_SPRAY | Freq: Every day | RESPIRATORY_TRACT | 0 refills | Status: DC

## 2021-03-25 MED ORDER — SPIRIVA RESPIMAT 2.5 MCG/ACT IN AERS: 2.0000 | INHALATION_SPRAY | Freq: Every day | RESPIRATORY_TRACT | 6 refills | Status: DC

## 2021-07-20 ENCOUNTER — Ambulatory Visit: Payer: Medicare Other | Attending: Internal Medicine

## 2021-07-20 ENCOUNTER — Other Ambulatory Visit (HOSPITAL_BASED_OUTPATIENT_CLINIC_OR_DEPARTMENT_OTHER): Payer: Self-pay

## 2021-07-20 DIAGNOSIS — Z23 Encounter for immunization: Secondary | ICD-10-CM

## 2021-07-20 MED ORDER — MODERNA COVID-19 BIVAL BOOSTER 50 MCG/0.5ML IM SUSP
INTRAMUSCULAR | 0 refills | Status: DC
  Filled 2021-07-20: qty 0.5, 1d supply, fill #0

## 2021-07-20 MED ORDER — INFLUENZA VAC A&B SA ADJ QUAD 0.5 ML IM PRSY
PREFILLED_SYRINGE | INTRAMUSCULAR | 0 refills | Status: DC
  Filled 2021-07-20: qty 0.5, 1d supply, fill #0

## 2021-07-20 NOTE — Progress Notes (Signed)
   Covid-19 Vaccination Clinic  Name:  Arcelia Pals    MRN: [id number] DOB: 02/13/1941  07/20/2021  Ms. Zegarra was observed post Covid-19 immunization for 15 minutes without incident. She was provided with Vaccine Information Sheet and instruction to access the V-Safe system.   Ms. Gutkowski was instructed to call 911 with any severe reactions post vaccine: Difficulty breathing  Swelling of face and throat  A fast heartbeat  A bad rash all over body  Dizziness and weakness   Immunizations Administered     Name Date Dose VIS Date Route   Moderna Covid-19 vaccine Bivalent Booster 07/20/2021 11:55 AM 0.5 mL 04/25/2021 Intramuscular   Manufacturer: Moderna   Lot: [id number]   NDC: 55974-163-84

## 2021-10-30 ENCOUNTER — Telehealth: Payer: Self-pay | Admitting: Pulmonary Disease

## 2021-11-02 NOTE — Telephone Encounter (Signed)
Called patient and she states that she was prescribed spironolactone 25mg  diuretic. She states that her legs are swollen but she is worried because her lab work was abnormal. She is just want to check with Dr Loanne Drilling to make sure that this is safe for her to take.  Dr Loanne Drilling please advise

## 2021-11-02 NOTE — Telephone Encounter (Signed)
Patient states prescribed Spironolactone 50 mg. Patient phone number is [phone].

## 2021-11-03 NOTE — Telephone Encounter (Signed)
Patient has not been seen in our clinic since 2021. We did not prescribe the spironolactone so she will need to discuss with physician who ordered it.  Otherwise she can schedule appointment for follow-up with me or NP when next available for COPD since last visit was June 2021.

## 2021-11-03 NOTE — Telephone Encounter (Signed)
Spoke with the pt and notified her of response per Dr Everardo All  She verbalized understanding  She will discuss med with PCP  She scheduled rov with Dr Everardo All for 11/19/21 Nothing further needed

## 2021-11-19 ENCOUNTER — Encounter: Payer: Self-pay | Admitting: Pulmonary Disease

## 2021-11-19 ENCOUNTER — Other Ambulatory Visit: Payer: Self-pay

## 2021-11-19 ENCOUNTER — Ambulatory Visit: Payer: Medicare Other | Admitting: Pulmonary Disease

## 2021-11-19 VITALS — BP 128/62 | HR 89 | Wt 152.4 lb

## 2021-11-19 DIAGNOSIS — J432 Centrilobular emphysema: Secondary | ICD-10-CM

## 2021-11-19 DIAGNOSIS — J9611 Chronic respiratory failure with hypoxia: Secondary | ICD-10-CM | POA: Diagnosis not present

## 2021-11-19 NOTE — Patient Instructions (Addendum)
Very severe COPD ?--CONTINUE Symbicort TWO puffs TWICE a day ?--CONTINUE Spiriva handinhaler ONE puff ONCE a day ?--CONTINUE Duonebs as needed for shortness of breath ? ?Nocturnal and exertional hypoxemia ?--CONTINUE 2L O2 with activity and sleep ?--Goal SpO2 >88% ? ?Deconditioning ?--Encourage frequent activity ? ?Follow-up with me in 6 months ?

## 2021-11-19 NOTE — Progress Notes (Unsigned)
Subjective:    Patient ID: Rachel Perez, female    DOB: 12/04/1940, 81 y.o.   MRN: [id number]  Synopsis: Former patient of Dr. Kendrick Fries who has COPD.   HPI Chief Complaint  Patient presents with   Follow-up    Wheezing and sob   [name] is a 81 year old female with severe emphysema who presents for follow-up.  She is a former Dr. Kendrick Fries patient. She is compliant with her inhalers. She does use her Albuterol three times a day. With activity, her oxygen will drop to 85%. She has nocturnal oxygen but does not wear usually during the day. She is not exercising as much due to pandemic. She feels this is contributing to her general weakness and stamina.  11/19/21 Since our last visit in 2021, she recently had lower extremity edema and weakness. Her PCP has been prescribing diuretics which has helped as well as compression socks. She is compliant with her Symbicort and Spiriva handihaler. Compliant with her home oxygen at night.  Past Medical History:  Diagnosis Date   Anxiety    Arthritis    Cataract    COPD (chronic obstructive pulmonary disease) (HCC)    Depression    Emphysema of lung (HCC)    H/O bladder infections    Osteoporosis    Thyroid disease    Review of Systems  Constitutional:  Negative for chills, diaphoresis and fever.  HENT:  Negative for congestion.   Respiratory:  Negative for cough, shortness of breath and wheezing.   Cardiovascular:  Negative for chest pain, palpitations and leg swelling.     Objective:    Vitals:   11/19/21 1409  BP: 128/62  Pulse: 89  SpO2: 90%  Weight: 152 lb 6.4 oz (69.1 kg)     Physical Exam: General: Well-appearing, no acute distress HENT: [place], AT Eyes: EOMI, no scleral icterus Respiratory: Clear to auscultation bilaterally.  No crackles, wheezing or rales Cardiovascular: RRR, -M/R/G, no JVD Extremities:-Edema,-tenderness Neuro: AAO x4, CNII-XII grossly intact Psych: Normal mood, normal  affect   Interim Data:  PFT 07/17/2015 FVC 2.45  (74%) FEV1 0.75 (29%) Ratio 36  TLC 192% DLCO 30%. Significant BD response and air trapping. Consistent with emphysema Interpretation: Very severe emphysema  Sleep study: Sleep study in November 2017 showed nocturnal hypoxemia (82% on 2L) but no evidence of obstructive sleep apnea.     Assessment & Plan:   Discussion: 81 year old female with very severe COPD, chronic hypoxemic respiratory failure who presents for follow-up.   Very severe COPD --CONTINUE Symbicort TWO puffs TWICE a day --CONTINUE Spiriva handinhaler ONE puff ONCE a day --CONTINUE Duonebs as needed for shortness of breath  Deconditioning --Encourage frequent activity  Nocturnal and exertional hypoxemia --CONTINUE 2L O2 with activity and sleep --Goal SpO2 >88%  Follow-up with me in 6 months   Current Outpatient Medications:    acidophilus (RISAQUAD) CAPS capsule, Take 1 capsule by mouth daily., Disp: , Rfl:    alendronate (FOSAMAX) 70 MG tablet, TAKE 1 TABLET BY MOUTH EVERY 7 DAYS WITH A FULL GLASS OF WATER ON AN EMPTY STOMACH, Disp: 4 tablet, Rfl: 11   ALPRAZolam (XANAX) 0.5 MG tablet, Take 1 tablet (0.5 mg total) by mouth at bedtime as needed., Disp: 30 tablet, Rfl: 0   budesonide-formoterol (SYMBICORT) 160-4.5 MCG/ACT inhaler, Inhale 2 puffs into the lungs 2 (two) times daily., Disp: 1 Inhaler, Rfl: 11   Calcium Carbonate (CALCIUM 500 PO), Take 500 Units by  mouth 3 (three) times daily., Disp: , Rfl:    Cholecalciferol (VITAMIN D) 2000 UNITS tablet, Take 4,000 Units by mouth daily. , Disp: , Rfl:    Cranberry 300 MG tablet, Take 300 mg by mouth 2 (two) times daily., Disp: , Rfl:    dextromethorphan-guaiFENesin (MUCINEX DM) 30-600 MG 12hr tablet, Take 1 tablet by mouth daily. , Disp: , Rfl:    influenza vaccine adjuvanted (FLUAD) 0.5 ML injection, Inject into the muscle., Disp: 0.5 mL, Rfl: 0   ipratropium-albuterol (DUONEB) 0.5-2.5 (3) MG/3ML SOLN, 1 vial tid  (Patient taking differently: Take 3 mLs by nebulization in the morning, at noon, and at bedtime.), Disp: 270 mL, Rfl: 11   Multiple Vitamin (MULTIVITAMIN) tablet, Take 1 tablet by mouth daily., Disp: , Rfl:    PROAIR HFA 108 (90 Base) MCG/ACT inhaler, Inhale 1-2 puffs into the lungs every 6 (six) hours as needed for wheezing or shortness of breath., Disp: 2 Inhaler, Rfl: 11   Respiratory Therapy Supplies (FLUTTER) DEVI, Use as directed, Disp: 1 each, Rfl: 0   Spacer/Aero-Holding Chambers (AEROCHAMBER MV) inhaler, Use as instructed, Disp: 1 each, Rfl: 0   spironolactone (ALDACTONE) 50 MG tablet, Take 50 mg by mouth daily., Disp: , Rfl:    COVID-19 mRNA bivalent vaccine, Moderna, (MODERNA COVID-19 BIVAL BOOSTER) 50 MCG/0.5ML injection, Inject into the muscle. (Patient not taking: Reported on 11/19/2021), Disp: 0.5 mL, Rfl: 0   COVID-19 mRNA vaccine, Moderna, 100 MCG/0.5ML injection, Inject into the muscle. (Patient not taking: Reported on 11/19/2021), Disp: 0.25 mL, Rfl: 0   Tiotropium Bromide Monohydrate (SPIRIVA RESPIMAT) 2.5 MCG/ACT AERS, Inhale 2 puffs into the lungs daily., Disp: 4 g, Rfl: 6  I have spent 31-minutes charting, coordinating care and discussing medical diagnosis, plan and counseling with the patient/family as noted above.  Mechele Collin, M.D. [facility] Pulmonary/Critical Care Medicine 11/19/2021 2:37 PM

## 2021-11-25 ENCOUNTER — Encounter: Payer: Self-pay | Admitting: Pulmonary Disease

## 2022-01-18 ENCOUNTER — Other Ambulatory Visit (HOSPITAL_BASED_OUTPATIENT_CLINIC_OR_DEPARTMENT_OTHER): Payer: Self-pay

## 2022-01-18 ENCOUNTER — Other Ambulatory Visit (HOSPITAL_COMMUNITY): Payer: Self-pay

## 2022-01-18 MED ORDER — IPRATROPIUM-ALBUTEROL 0.5-2.5 (3) MG/3ML IN SOLN
RESPIRATORY_TRACT | 11 refills | Status: AC
  Filled 2022-01-18 – 2022-01-19 (×2): qty 360, 40d supply, fill #0

## 2022-01-19 ENCOUNTER — Other Ambulatory Visit (HOSPITAL_COMMUNITY): Payer: Self-pay

## 2022-01-19 ENCOUNTER — Other Ambulatory Visit (HOSPITAL_BASED_OUTPATIENT_CLINIC_OR_DEPARTMENT_OTHER): Payer: Self-pay

## 2022-01-22 ENCOUNTER — Other Ambulatory Visit (HOSPITAL_BASED_OUTPATIENT_CLINIC_OR_DEPARTMENT_OTHER): Payer: Self-pay

## 2022-01-22 MED ORDER — ZOSTER VAC RECOMB ADJUVANTED 50 MCG/0.5ML IM SUSR
INTRAMUSCULAR | 1 refills | Status: DC
  Filled 2022-01-22: qty 0.5, 1d supply, fill #0

## 2022-02-01 ENCOUNTER — Other Ambulatory Visit (HOSPITAL_COMMUNITY): Payer: Self-pay

## 2022-02-04 ENCOUNTER — Other Ambulatory Visit (HOSPITAL_COMMUNITY): Payer: Self-pay

## 2022-02-06 ENCOUNTER — Other Ambulatory Visit (HOSPITAL_COMMUNITY): Payer: Self-pay

## 2022-02-10 ENCOUNTER — Other Ambulatory Visit (HOSPITAL_BASED_OUTPATIENT_CLINIC_OR_DEPARTMENT_OTHER): Payer: Self-pay

## 2022-02-10 ENCOUNTER — Ambulatory Visit: Payer: Medicare Other | Attending: Internal Medicine

## 2022-02-10 DIAGNOSIS — Z23 Encounter for immunization: Secondary | ICD-10-CM

## 2022-02-10 MED ORDER — MODERNA COVID-19 BIVAL BOOSTER 50 MCG/0.5ML IM SUSP
INTRAMUSCULAR | 0 refills | Status: DC
  Filled 2022-02-10: qty 0.5, 1d supply, fill #0

## 2022-02-10 NOTE — Progress Notes (Signed)
   Covid-19 Vaccination Clinic  Name:  Rachel Perez    MRN: [id number] DOB: 29-Apr-1941  02/10/2022  Rachel Perez was observed post Covid-19 immunization for 15 minutes without incident. She was provided with Vaccine Information Sheet and instruction to access the V-Safe system.   Rachel Perez was instructed to call 911 with any severe reactions post vaccine: Difficulty breathing  Swelling of face and throat  A fast heartbeat  A bad rash all over body  Dizziness and weakness   Immunizations Administered     Name Date Dose VIS Date Route   Moderna Covid-19 vaccine Bivalent Booster 02/10/2022  2:19 PM 0.5 mL 04/25/2021 Intramuscular   Manufacturer: Gala Murdoch   Lot: [id number]   NDC: 54982-641-58

## 2022-02-22 ENCOUNTER — Other Ambulatory Visit (HOSPITAL_BASED_OUTPATIENT_CLINIC_OR_DEPARTMENT_OTHER): Payer: Self-pay

## 2022-05-18 ENCOUNTER — Ambulatory Visit: Payer: Medicare Other | Admitting: Pulmonary Disease

## 2022-05-18 ENCOUNTER — Encounter: Payer: Self-pay | Admitting: Pulmonary Disease

## 2022-05-18 VITALS — BP 110/60 | HR 123 | Ht 67.0 in | Wt 151.4 lb

## 2022-05-18 DIAGNOSIS — J9611 Chronic respiratory failure with hypoxia: Secondary | ICD-10-CM

## 2022-05-18 DIAGNOSIS — J432 Centrilobular emphysema: Secondary | ICD-10-CM

## 2022-05-18 NOTE — Patient Instructions (Addendum)
Very severe COPD --CONTINUE Symbicort TWO puffs TWICE a day --CONTINUE Spiriva handinhaler ONE puff ONCE a day --CONTINUE Duonebs as needed for shortness of breath --Discussed vaccinations: Already received influenza. RSV and COVID encouraged  Nocturnal and exertional hypoxemia --CONTINUE 1-2L O2 with activity and sleep --Goal SpO2 >88%  Follow-up with me with me in 6 months

## 2022-05-18 NOTE — Progress Notes (Signed)
Subjective:    Patient ID: Rachel Perez, female    DOB: April 21, 1941, 81 y.o.   MRN: [id number]  Synopsis: Former patient of Dr. Kendrick Fries who has COPD.   HPI Chief Complaint  Patient presents with   Follow-up   [name] is a 81 year old female with severe emphysema who presents for follow-up.  She is a former Dr. Kendrick Fries patient. She is compliant with her inhalers. She does use her Albuterol three times a day. With activity, her oxygen will drop to 85%. She has nocturnal oxygen but does not wear usually during the day. She is not exercising as much due to pandemic. She feels this is contributing to her general weakness and stamina.  11/19/21 Since our last visit in 2021, she recently had lower extremity edema and weakness. Her PCP has been prescribing diuretics which has helped as well as compression socks. She is compliant with her Symbicort and Spiriva handihaler. Compliant with her home oxygen at night. Reports symptoms of shortness of breath are well-controlled on current regimen. No cough, no wheezing  05/18/22 She reports desaturation with activity to 83% but will recover within 2 months with rest. Compliant with oxygen nightly. She has shortness of breath, wheezing, nasal congestion. Using nasal spray. Denies cough Did not take lasix since she is going out today. She is compliant with Symbicort and Spiriva  Past Medical History:  Diagnosis Date   Anxiety    Arthritis    Cataract    COPD (chronic obstructive pulmonary disease) (HCC)    Depression    Emphysema of lung (HCC)    H/O bladder infections    Osteoporosis    Thyroid disease    Review of Systems  Constitutional:  Negative for chills, diaphoresis and fever.  HENT:  Negative for congestion.   Respiratory:  Positive for shortness of breath. Negative for cough and wheezing.   Cardiovascular:  Negative for chest pain, palpitations and leg swelling.      Objective:    Vitals:   05/18/22 1356  BP:  110/60  Pulse: (!) 123  SpO2: 92%  Weight: 151 lb 6.4 oz (68.7 kg)  Height: 5\' 7"  (1.702 m)     Physical Exam: General: Well-appearing, no acute distress HENT: [place], AT Eyes: EOMI, no scleral icterus Respiratory: Clear to auscultation bilaterally.  No crackles, wheezing or rales Cardiovascular: RRR, -M/R/G, no JVD Extremities: 2+ pitting edema,-tenderness Neuro: AAO x4, CNII-XII grossly intact Psych: Normal mood, normal affect  Interim Data:  PFT 07/17/2015 FVC 2.45  (74%) FEV1 0.75 (29%) Ratio 36  TLC 192% DLCO 30%. Significant BD response and air trapping. Consistent with emphysema Interpretation: Very severe emphysema  Sleep study: Sleep study in November 2017 showed nocturnal hypoxemia (82% on 2L) but no evidence of obstructive sleep apnea.     Assessment & Plan:   Discussion: 81 year old female with very severe COPD, chronic hypoxemic respiratory failure who presents for follow-up. Discussed clinical course and management of COPD including bronchodilator regimen, vaccinations and action plan for exacerbation.  Very severe COPD --CONTINUE Symbicort TWO puffs TWICE a day --CONTINUE Spiriva handinhaler ONE puff ONCE a day --CONTINUE Duonebs as needed for shortness of breath --Discussed vaccinations: Already received influenza. RSV and COVID encouraged  Nocturnal and exertional hypoxemia --CONTINUE 1-2L O2 with activity and sleep --Goal SpO2 >88%  Deconditioning --Encourage frequent activity   Current Outpatient Medications:    budesonide-formoterol (SYMBICORT) 160-4.5 MCG/ACT inhaler, Inhale 2 puffs into the lungs 2 (  two) times daily., Disp: 1 Inhaler, Rfl: 11   dextromethorphan-guaiFENesin (MUCINEX DM) 30-600 MG 12hr tablet, Take 1 tablet by mouth daily. , Disp: , Rfl:    ipratropium-albuterol (DUONEB) 0.5-2.5 (3) MG/3ML SOLN, Inhale 1 vial via nebulizer 3 times a day, Disp: 360 mL, Rfl: 11   PROAIR HFA 108 (90 Base) MCG/ACT inhaler, Inhale 1-2 puffs into the lungs  every 6 (six) hours as needed for wheezing or shortness of breath., Disp: 2 Inhaler, Rfl: 11   Respiratory Therapy Supplies (FLUTTER) DEVI, Use as directed, Disp: 1 each, Rfl: 0   Spacer/Aero-Holding Chambers (AEROCHAMBER MV) inhaler, Use as instructed, Disp: 1 each, Rfl: 0   tiotropium (SPIRIVA HANDIHALER) 18 MCG inhalation capsule, Place 1 capsule (18 mcg total) into inhaler and inhale daily., Disp: 30 capsule, Rfl:    acidophilus (RISAQUAD) CAPS capsule, Take 1 capsule by mouth daily., Disp: , Rfl:    alendronate (FOSAMAX) 70 MG tablet, TAKE 1 TABLET BY MOUTH EVERY 7 DAYS WITH A FULL GLASS OF WATER ON AN EMPTY STOMACH, Disp: 4 tablet, Rfl: 11   ALPRAZolam (XANAX) 0.5 MG tablet, Take 1 tablet (0.5 mg total) by mouth at bedtime as needed., Disp: 30 tablet, Rfl: 0   Calcium Carbonate (CALCIUM 500 PO), Take 500 Units by mouth 3 (three) times daily., Disp: , Rfl:    Cholecalciferol (VITAMIN D) 2000 UNITS tablet, Take 4,000 Units by mouth daily. , Disp: , Rfl:    COVID-19 mRNA bivalent vaccine, Moderna, (MODERNA COVID-19 BIVAL BOOSTER) 50 MCG/0.5ML injection, Inject into the muscle. (Patient not taking: Reported on 11/19/2021), Disp: 0.5 mL, Rfl: 0   COVID-19 mRNA bivalent vaccine, Moderna, (MODERNA COVID-19 BIVAL BOOSTER) 50 MCG/0.5ML injection, Inject into the muscle., Disp: 0.5 mL, Rfl: 0   COVID-19 mRNA vaccine, Moderna, 100 MCG/0.5ML injection, Inject into the muscle. (Patient not taking: Reported on 11/19/2021), Disp: 0.25 mL, Rfl: 0   Cranberry 300 MG tablet, Take 300 mg by mouth 2 (two) times daily., Disp: , Rfl:    influenza vaccine adjuvanted (FLUAD) 0.5 ML injection, Inject into the muscle., Disp: 0.5 mL, Rfl: 0   Multiple Vitamin (MULTIVITAMIN) tablet, Take 1 tablet by mouth daily., Disp: , Rfl:    spironolactone (ALDACTONE) 50 MG tablet, Take 50 mg by mouth daily., Disp: , Rfl:    Zoster Vaccine Adjuvanted [facility]) injection, Inject into the muscle., Disp: 0.5 mL, Rfl: 1  I have spent a  total time of 31-minutes on the day of the appointment including chart review, data review, collecting history, coordinating care and discussing medical diagnosis and plan with the patient/family. Past medical history, allergies, medications were reviewed. Pertinent imaging, labs and tests included in this note have been reviewed and interpreted independently by me.  Return in about 6 months (around 11/16/2022).   Mechele Collin, MD [facility] Pulmonary/Critical Care Medicine 05/18/2022 2:26 PM

## 2022-05-28 ENCOUNTER — Other Ambulatory Visit (HOSPITAL_BASED_OUTPATIENT_CLINIC_OR_DEPARTMENT_OTHER): Payer: Self-pay

## 2022-05-28 MED ORDER — AREXVY 120 MCG/0.5ML IM SUSR
INTRAMUSCULAR | 0 refills | Status: DC
Start: 2022-05-28 — End: 2022-11-19
  Filled 2022-05-28: qty 0.5, 1d supply, fill #0

## 2022-11-19 ENCOUNTER — Encounter (HOSPITAL_BASED_OUTPATIENT_CLINIC_OR_DEPARTMENT_OTHER): Payer: Self-pay | Admitting: Pulmonary Disease

## 2022-11-19 ENCOUNTER — Ambulatory Visit (HOSPITAL_BASED_OUTPATIENT_CLINIC_OR_DEPARTMENT_OTHER): Payer: Medicare Other | Admitting: Pulmonary Disease

## 2022-11-19 VITALS — BP 130/66 | HR 90 | Ht 67.0 in | Wt 151.9 lb

## 2022-11-19 DIAGNOSIS — J9611 Chronic respiratory failure with hypoxia: Secondary | ICD-10-CM

## 2022-11-19 DIAGNOSIS — J432 Centrilobular emphysema: Secondary | ICD-10-CM | POA: Diagnosis not present

## 2022-11-19 NOTE — Progress Notes (Signed)
Subjective:    Patient ID: Rachel Perez, female    DOB: 05/09/1941, 82 y.o.   MRN: [id number]  Synopsis: Former patient of Dr. Lake Bells who has COPD.   HPI Chief Complaint  Patient presents with  . Follow-up    Pt states only wears o2 at, states her o2 has been dropping to 79% and within 31mn she can bring it up to 90% with deep breathing   Ms. Rachel Brueggenis a 82year old female with severe emphysema who presents for follow-up.  She is a former Dr. MLake Bellspatient. She is compliant with her inhalers. She does use her Albuterol three times a day. With activity, her oxygen will drop to 85%. She has nocturnal oxygen but does not wear usually during the day. She is not exercising as much due to pandemic. She feels this is contributing to her general weakness and stamina.  11/19/21 Since our last visit in 2021, she recently had lower extremity edema and weakness. Her PCP has been prescribing diuretics which has helped as well as compression socks. She is compliant with her Symbicort and Spiriva handihaler. Compliant with her home oxygen at night. Reports symptoms of shortness of breath are well-controlled on current regimen. No cough, no wheezing  05/18/22 She reports desaturation with activity to 83% but will recover within 2 months with rest. Compliant with oxygen nightly. She has shortness of breath, wheezing, nasal congestion. Using nasal spray. Denies cough Did not take lasix since she is going out today. She is compliant with Symbicort and Spiriva  11/19/22 Since our last visit she reports having had decline in activity and now enrolled in home physical therapy. She is compliant with Symbicort and Spiriva. Has been having more fatigue. She has shortness of breath and needing to purse lip breath more often. Has noticed her oxygen persists to 85% at times even on oxygen. Denies cough or wheezing.   Past Medical History:  Diagnosis Date  . Anxiety   . Arthritis   . Cataract   .  COPD (chronic obstructive pulmonary disease) ([place]   . Depression   . Emphysema of lung ([facility]   . H/O bladder infections   . Osteoporosis   . Thyroid disease    Review of Systems  Constitutional:  Negative for chills, diaphoresis and fever.  HENT:  Negative for congestion.   Respiratory:  Positive for shortness of breath. Negative for cough and wheezing.   Cardiovascular:  Negative for chest pain, palpitations and leg swelling.      Objective:    Vitals:   11/19/22 1326  BP: 130/66  Pulse: 90  SpO2: 92%  Weight: 151 lb 14.4 oz (68.9 kg)  Height: '5\' 7"'$  (1.702 m)     Physical Exam: General: Elderly-appearing, no acute distress HENT: [place], AT Eyes: EOMI, no scleral icterus Respiratory: ***Clear to auscultation bilaterally.  No crackles, wheezing or rales Cardiovascular: RRR, -M/R/G, no JVD Extremities:-Edema,-tenderness Neuro: AAO x4, CNII-XII grossly intact Psych: Normal mood, normal affect  Interim Data:  Chest imaging: CXR 01/21/20 - Hyperinflation. Chronic coarse interstitial markings  PFT 07/17/2015 FVC 2.45  (74%) FEV1 0.75 (29%) Ratio 36  TLC 192% DLCO 30%. Significant BD response and air trapping. Consistent with emphysema Interpretation: Very severe emphysema  Sleep study: Sleep study in November 2017 showed nocturnal hypoxemia (82% on 2L) but no evidence of obstructive sleep apnea.     Assessment & Plan:   Discussion: 82year old female with very severe COPD, chronic  hypoxemic respiratory failure who presents for follow-up. Discussed clinical course and management of COPD including bronchodilator regimen, vaccinations and action plan for exacerbation.  82 year old female with very severe COPD, chronic hypoxemic respiratory failure who presents for follow-up. Discussed clinical course and management of COPD/asthma including preventive care, bronchodilator regimen and action plan for exacerbation.  Very severe COPD --CONTINUE Symbicort 160-4.5 TWO puffs TWICE  a day --CONTINUE Spiriva handinhaler ONE puff ONCE a day --CONTINUE Duonebs as needed for shortness of breath --CONTINUE physical therapy  Chronica hypoxemic respiratory failure --INCREASE oxygen to 3L with activity on portable oxygen --CONTINUE 2L oxygen with sleep --Goal SpO2 >88%  Deconditioning --Encourage frequent activity   Current Outpatient Medications:  .  acidophilus (RISAQUAD) CAPS capsule, Take 1 capsule by mouth daily., Disp: , Rfl:  .  alendronate (FOSAMAX) 70 MG tablet, TAKE 1 TABLET BY MOUTH EVERY 7 DAYS WITH A FULL GLASS OF WATER ON AN EMPTY STOMACH, Disp: 4 tablet, Rfl: 11 .  ALPRAZolam (XANAX) 0.5 MG tablet, Take 1 tablet (0.5 mg total) by mouth at bedtime as needed., Disp: 30 tablet, Rfl: 0 .  budesonide-formoterol (SYMBICORT) 160-4.5 MCG/ACT inhaler, Inhale 2 puffs into the lungs 2 (two) times daily., Disp: 1 Inhaler, Rfl: 11 .  Calcium Carbonate (CALCIUM 500 PO), Take 500 Units by mouth 3 (three) times daily., Disp: , Rfl:  .  Cholecalciferol (VITAMIN D) 2000 UNITS tablet, Take 4,000 Units by mouth daily. , Disp: , Rfl:  .  COVID-19 mRNA bivalent vaccine, Moderna, (MODERNA COVID-19 BIVAL BOOSTER) 50 MCG/0.5ML injection, Inject into the muscle., Disp: 0.5 mL, Rfl: 0 .  Cranberry 300 MG tablet, Take 300 mg by mouth 2 (two) times daily., Disp: , Rfl:  .  dextromethorphan-guaiFENesin (MUCINEX DM) 30-600 MG 12hr tablet, Take 1 tablet by mouth daily. , Disp: , Rfl:  .  influenza vaccine adjuvanted (FLUAD) 0.5 ML injection, Inject into the muscle., Disp: 0.5 mL, Rfl: 0 .  ipratropium-albuterol (DUONEB) 0.5-2.5 (3) MG/3ML SOLN, Inhale 1 vial via nebulizer 3 times a day, Disp: 360 mL, Rfl: 11 .  Multiple Vitamin (MULTIVITAMIN) tablet, Take 1 tablet by mouth daily., Disp: , Rfl:  .  PROAIR HFA 108 (90 Base) MCG/ACT inhaler, Inhale 1-2 puffs into the lungs every 6 (six) hours as needed for wheezing or shortness of breath., Disp: 2 Inhaler, Rfl: 11 .  Respiratory Therapy  Supplies (FLUTTER) DEVI, Use as directed, Disp: 1 each, Rfl: 0 .  RSV vaccine recomb adjuvanted (AREXVY) 120 MCG/0.5ML injection, Inject into the muscle., Disp: 0.5 mL, Rfl: 0 .  Spacer/Aero-Holding Chambers (AEROCHAMBER MV) inhaler, Use as instructed, Disp: 1 each, Rfl: 0 .  spironolactone (ALDACTONE) 50 MG tablet, Take 50 mg by mouth daily., Disp: , Rfl:  .  tiotropium (SPIRIVA HANDIHALER) 18 MCG inhalation capsule, Place 1 capsule (18 mcg total) into inhaler and inhale daily., Disp: 30 capsule, Rfl:  .  COVID-19 mRNA bivalent vaccine, Moderna, (MODERNA COVID-19 BIVAL BOOSTER) 50 MCG/0.5ML injection, Inject into the muscle. (Patient not taking: Reported on 11/19/2021), Disp: 0.5 mL, Rfl: 0 .  COVID-19 mRNA vaccine, Moderna, 100 MCG/0.5ML injection, Inject into the muscle. (Patient not taking: Reported on 11/19/2021), Disp: 0.25 mL, Rfl: 0 .  Zoster Vaccine Adjuvanted [facility]) injection, Inject into the muscle. (Patient not taking: Reported on 11/19/2022), Disp: 0.5 mL, Rfl: 1  I have spent a total time of 35-minutes on the day of the appointment including chart review, data review, collecting history, coordinating care and discussing medical diagnosis and plan  with the patient/family. Past medical history, allergies, medications were reviewed. Pertinent imaging, labs and tests included in this note have been reviewed and interpreted independently by me  No follow-ups on file.  Rodman Pickle, MD [facility] 11/19/2022 1:41 PM

## 2022-11-19 NOTE — Patient Instructions (Addendum)
Very severe COPD --CONTINUE Symbicort 160-4.5 TWO puffs TWICE a day --CONTINUE Spiriva handinhaler ONE puff ONCE a day --CONTINUE Duonebs as needed for shortness of breath --CONTINUE physical therapy  Chronica hypoxemic respiratory failure --INCREASE oxygen to 3L with activity on portable oxygen --CONTINUE 2L oxygen with sleep --Goal SpO2 >88%  Follow-up with me in 4 months

## 2022-12-02 ENCOUNTER — Encounter (HOSPITAL_BASED_OUTPATIENT_CLINIC_OR_DEPARTMENT_OTHER): Payer: Self-pay | Admitting: Pulmonary Disease

## 2023-10-03 ENCOUNTER — Ambulatory Visit (HOSPITAL_BASED_OUTPATIENT_CLINIC_OR_DEPARTMENT_OTHER): Payer: Medicare Other | Admitting: Pulmonary Disease

## 2023-11-01 ENCOUNTER — Encounter (HOSPITAL_BASED_OUTPATIENT_CLINIC_OR_DEPARTMENT_OTHER): Payer: Self-pay | Admitting: Pulmonary Disease

## 2023-11-01 ENCOUNTER — Ambulatory Visit (HOSPITAL_BASED_OUTPATIENT_CLINIC_OR_DEPARTMENT_OTHER): Payer: Medicare Other | Admitting: Pulmonary Disease

## 2023-11-01 VITALS — BP 122/68 | HR 101 | Ht 67.0 in | Wt 158.0 lb

## 2023-11-01 DIAGNOSIS — J449 Chronic obstructive pulmonary disease, unspecified: Secondary | ICD-10-CM

## 2023-11-01 DIAGNOSIS — J432 Centrilobular emphysema: Secondary | ICD-10-CM

## 2023-11-01 DIAGNOSIS — R29898 Other symptoms and signs involving the musculoskeletal system: Secondary | ICD-10-CM | POA: Diagnosis not present

## 2023-11-01 DIAGNOSIS — J9611 Chronic respiratory failure with hypoxia: Secondary | ICD-10-CM | POA: Diagnosis not present

## 2023-11-01 NOTE — Progress Notes (Signed)
 Subjective:    Patient ID: Rachel Perez, female    DOB: 29-Apr-1941, 83 y.o.   MRN: [id number]  Synopsis: Former patient of Dr. Kendrick Fries who has COPD.   HPI Chief Complaint  Patient presents with   Follow-up    Centrilobular emphysema   [name] is a 83 year old female with severe emphysema who presents for follow-up.  She is a former Dr. Kendrick Fries patient. She is compliant with her inhalers. She does use her Albuterol three times a day. With activity, her oxygen will drop to 85%. She has nocturnal oxygen but does not wear usually during the day. She is not exercising as much due to pandemic. She feels this is contributing to her general weakness and stamina.  11/19/21 Since our last visit in 2021, she recently had lower extremity edema and weakness. Her PCP has been prescribing diuretics which has helped as well as compression socks. She is compliant with her Symbicort and Spiriva handihaler. Compliant with her home oxygen at night. Reports symptoms of shortness of breath are well-controlled on current regimen. No cough, no wheezing  05/18/22 She reports desaturation with activity to 83% but will recover within 2 months with rest. Compliant with oxygen nightly. She has shortness of breath, wheezing, nasal congestion. Using nasal spray. Denies cough Did not take lasix since she is going out today. She is compliant with Symbicort and Spiriva  11/19/22 Since our last visit she reports having had decline in activity and now enrolled in home physical therapy. She is compliant with Symbicort and Spiriva. Has been having more fatigue. She has shortness of breath and needing to purse lip breath more often. Has noticed her oxygen persists to 85% at times even on oxygen. Denies cough or wheezing. Denies exacerbation or ED visit.  11/01/23 Since our last visit she reports compliance with inhalers and Duonbs TID and oxygen. Her oxygen levels will drop to mid 80s. Has shortness of breath and  tachycardia. Unable to walk 10 feet. Previously home PT but did not feel benefit since it was once a week. Denies cough or wheezing. She is relatively independent with ADLs however daughter is concerned. Denies hospitalizations or ED visits. Since October she has been having more anxiety  Past Medical History:  Diagnosis Date   Anxiety    Arthritis    Cataract    COPD (chronic obstructive pulmonary disease) (HCC)    Depression    Emphysema of lung (HCC)    H/O bladder infections    Osteoporosis    Thyroid disease    Review of Systems  Constitutional:  Negative for chills, diaphoresis and fever.  HENT:  Negative for congestion.   Respiratory:  Positive for shortness of breath. Negative for cough and wheezing.   Cardiovascular:  Positive for palpitations. Negative for chest pain and leg swelling.      Objective:    Vitals:   11/01/23 1412  BP: 122/68  Pulse: (!) 101  SpO2: 94%  Weight: 158 lb (71.7 kg)  Height: 5\' 7"  (1.702 m)     Physical Exam: General: Well-appearing, no acute distress HENT: [place], AT Eyes: EOMI, no scleral icterus Respiratory: Diminished to auscultation bilaterally.  No crackles, wheezing or rales Cardiovascular: RRR, -M/R/G, no JVD Extremities:-Edema,-tenderness Neuro: AAO x4, CNII-XII grossly intact Psych: Normal mood, normal affect  Interim Data:  Chest imaging: CXR 01/21/20 - Hyperinflation. Chronic coarse interstitial markings  PFT 07/17/2015 FVC 2.45  (74%) FEV1 0.75 (29%) Ratio 36  TLC  192% DLCO 30%. Significant BD response and air trapping. Consistent with emphysema Interpretation: Very severe emphysema  Sleep study: Sleep study in November 2017 showed nocturnal hypoxemia (82% on 2L) but no evidence of obstructive sleep apnea.     Assessment & Plan:   Discussion: 83 year old female with very severe COPD, chronic hypoxemic respiratory failure who presents for follow-up. Ambulatory O2 demonstrated increased home O2 requirement at baseline.  Discussed clinical course and management of COPD including oxygen, bronchodilator regimen, preventive care and action plan for exacerbation.  Very severe COPD --CONTINUE Symbicort 160-4.5 TWO puffs TWICE a day --CONTINUE Spiriva handinhaler ONE puff ONCE a day --CONTINUE Duonebs three times daily and as needed for shortness of breath  Chronic hypoxemic respiratory failure --Ambulatory O2 in-office with desaturations --INCREASE oxygen to 4L continuous with activity and sleep --Goal SpO2 >88%  Deconditioning --Encourage frequent activity   Current Outpatient Medications:    acidophilus (RISAQUAD) CAPS capsule, Take 1 capsule by mouth daily., Disp: , Rfl:    alendronate (FOSAMAX) 70 MG tablet, TAKE 1 TABLET BY MOUTH EVERY 7 DAYS WITH A FULL GLASS OF WATER ON AN EMPTY STOMACH, Disp: 4 tablet, Rfl: 11   ALPRAZolam (XANAX) 0.5 MG tablet, Take 1 tablet (0.5 mg total) by mouth at bedtime as needed., Disp: 30 tablet, Rfl: 0   budesonide-formoterol (SYMBICORT) 160-4.5 MCG/ACT inhaler, Inhale 2 puffs into the lungs 2 (two) times daily., Disp: 1 Inhaler, Rfl: 11   Calcium Carbonate (CALCIUM 500 PO), Take 500 Units by mouth 3 (three) times daily., Disp: , Rfl:    Cholecalciferol (VITAMIN D) 2000 UNITS tablet, Take 4,000 Units by mouth daily. , Disp: , Rfl:    Cranberry 300 MG tablet, Take 300 mg by mouth 2 (two) times daily., Disp: , Rfl:    dextromethorphan-guaiFENesin (MUCINEX DM) 30-600 MG 12hr tablet, Take 1 tablet by mouth daily. , Disp: , Rfl:    ipratropium-albuterol (DUONEB) 0.5-2.5 (3) MG/3ML SOLN, Inhale 1 vial via nebulizer 3 times a day, Disp: 360 mL, Rfl: 11   Multiple Vitamin (MULTIVITAMIN) tablet, Take 1 tablet by mouth daily., Disp: , Rfl:    PROAIR HFA 108 (90 Base) MCG/ACT inhaler, Inhale 1-2 puffs into the lungs every 6 (six) hours as needed for wheezing or shortness of breath., Disp: 2 Inhaler, Rfl: 11   Respiratory Therapy Supplies (FLUTTER) DEVI, Use as directed, Disp: 1 each,  Rfl: 0   Spacer/Aero-Holding Chambers (AEROCHAMBER MV) inhaler, Use as instructed, Disp: 1 each, Rfl: 0   spironolactone (ALDACTONE) 50 MG tablet, Take 50 mg by mouth daily., Disp: , Rfl:    tiotropium (SPIRIVA HANDIHALER) 18 MCG inhalation capsule, Place 1 capsule (18 mcg total) into inhaler and inhale daily., Disp: 30 capsule, Rfl:   I have spent a total time of 35-minutes on the day of the appointment including chart review, data review, collecting history, coordinating care and discussing medical diagnosis and plan with the patient/family. Past medical history, allergies, medications were reviewed. Pertinent imaging, labs and tests included in this note have been reviewed and interpreted independently by me.  No follow-ups on file. Check in our deconditioning and oxygen status at next visit  Mechele Collin, MD [facility] Pulmonary/Critical Care Medicine 11/01/2023 2:34 PM

## 2023-11-01 NOTE — Patient Instructions (Signed)
Very severe COPD --CONTINUE Symbicort 160-4.5 TWO puffs TWICE a day --CONTINUE Spiriva handinhaler ONE puff ONCE a day --CONTINUE Duonebs three times daily and as needed for shortness of breath  Chronic hypoxemic respiratory failure --Ambulatory O2 in-office with desaturations --INCREASE oxygen to 4L continuous with activity and sleep --Goal SpO2 >88%

## 2024-01-06 ENCOUNTER — Ambulatory Visit (HOSPITAL_BASED_OUTPATIENT_CLINIC_OR_DEPARTMENT_OTHER): Payer: Medicare Other | Admitting: Pulmonary Disease

## 2024-01-06 ENCOUNTER — Encounter (HOSPITAL_BASED_OUTPATIENT_CLINIC_OR_DEPARTMENT_OTHER): Payer: Self-pay | Admitting: Pulmonary Disease

## 2024-01-06 VITALS — BP 123/59 | HR 117 | Ht 67.0 in | Wt 155.0 lb

## 2024-01-06 DIAGNOSIS — J449 Chronic obstructive pulmonary disease, unspecified: Secondary | ICD-10-CM | POA: Diagnosis not present

## 2024-01-06 DIAGNOSIS — J9611 Chronic respiratory failure with hypoxia: Secondary | ICD-10-CM

## 2024-01-06 DIAGNOSIS — J432 Centrilobular emphysema: Secondary | ICD-10-CM

## 2024-01-06 NOTE — Progress Notes (Signed)
 Subjective:    Patient ID: Rachel Perez, female    DOB: 12-Apr-1941, 83 y.o.   MRN: [id number]  Synopsis: Former patient of Dr. Elverna Hamman who has COPD.   HPI Chief Complaint  Patient presents with   Follow-up    Muscular deconditioning, Centrilobular emphysema   [name] is a 83 year old female with severe emphysema who presents for follow-up.  She is a former Dr. McQuaid patient. She is compliant with her inhalers. She does use her Albuterol  three times a day. With activity, her oxygen  will drop to 85%. She has nocturnal oxygen  but does not wear usually during the day. She is not exercising as much due to pandemic. She feels this is contributing to her general weakness and stamina.  11/19/21 Since our last visit in 2021, she recently had lower extremity edema and weakness. Her PCP has been prescribing diuretics which has helped as well as compression socks. She is compliant with her Symbicort  and Spiriva  handihaler. Compliant with her home oxygen  at night. Reports symptoms of shortness of breath are well-controlled on current regimen. No cough, no wheezing  05/18/22 She reports desaturation with activity to 83% but will recover within 2 months with rest. Compliant with oxygen  nightly. She has shortness of breath, wheezing, nasal congestion. Using nasal spray. Denies cough Did not take lasix since she is going out today. She is compliant with Symbicort  and Spiriva   11/19/22 Since our last visit she reports having had decline in activity and now enrolled in home physical therapy. She is compliant with Symbicort  and Spiriva . Has been having more fatigue. She has shortness of breath and needing to purse lip breath more often. Has noticed her oxygen  persists to 85% at times even on oxygen . Denies cough or wheezing. Denies exacerbation or ED visit.  11/01/23 Since our last visit she reports compliance with inhalers and Duonbs TID and oxygen . Her oxygen  levels will drop to mid 80s.  Has shortness of breath and tachycardia. Unable to walk 10 feet. Previously home PT but did not feel benefit since it was once a week. Denies cough or wheezing. She is relatively independent with ADLs however daughter is concerned. Denies hospitalizations or ED visits. Since October she has been having more anxiety  01/06/24 Since our last visit she reports her breathing has been terrible. She is on 4L O2 but still has dyspnea with exertion and continues to report desaturation and increased heart rate. Her symptoms worsen her anxiety. She still reports desaturations with activity below 90% but improves with rest. Sedentary at baseline.  Past Medical History:  Diagnosis Date   Anxiety    Arthritis    Cataract    COPD (chronic obstructive pulmonary disease) (HCC)    Depression    Emphysema of lung (HCC)    H/O bladder infections    Osteoporosis    Thyroid  disease    Review of Systems  Constitutional:  Negative for chills, diaphoresis and fever.  HENT:  Negative for congestion.   Respiratory:  Positive for shortness of breath. Negative for cough and wheezing.   Cardiovascular:  Positive for palpitations. Negative for chest pain and leg swelling.      Objective:    Vitals:   01/06/24 1138  BP: (!) 123/59  Pulse: (!) 117  SpO2: 95%  Weight: 155 lb (70.3 kg)  Height: 5\' 7"  (1.702 m)     Physical Exam: General: Well-appearing, no acute distress HENT: , AT Eyes: EOMI, no scleral icterus  Respiratory: Dimininshed to auscultation bilaterally.  No crackles, wheezing or rales Cardiovascular: RRR, -M/R/G, no JVD Extremities:-Edema,-tenderness Neuro: AAO x4, CNII-XII grossly intact Psych: Normal mood, normal affect  Interim Data:  Chest imaging: CXR 01/21/20 - Hyperinflation. Chronic coarse interstitial markings  PFT 07/17/2015 FVC 2.45  (74%) FEV1 0.75 (29%) Ratio 36  TLC 192% DLCO 30%. Significant BD response and air trapping. Consistent with emphysema Interpretation: Very  severe emphysema  Sleep study: Sleep study in November 2017 showed nocturnal hypoxemia (82% on 2L) but no evidence of obstructive sleep apnea.     Assessment & Plan:   Discussion: 83 year old female with very severe COPD, chronic hypoxemic respiratory failure who presents for follow-up. Discussed clinical course and management of COPD including bronchodilator regimen, oxygen  compliance and action plan for exacerbation. Encouraged activity and discussed benefit of pulmonary rehab.  Very severe COPD --CONTINUE Symbicort  160-4.5 TWO puffs TWICE a day --CONTINUE Spiriva  handinhaler ONE puff ONCE a day --CONTINUE Duonebs three times daily and as needed for shortness of breath --REFER to pulmonary rehab  Chronic hypoxemic respiratory failure --CONTINUE oxygen  to 4L continuous with activity and sleep --Goal SpO2 >88% --Due for re-certification 10/2024  Deconditioning --Encourage frequent activity   Current Outpatient Medications:    acidophilus (RISAQUAD) CAPS capsule, Take 1 capsule by mouth daily., Disp: , Rfl:    alendronate  (FOSAMAX ) 70 MG tablet, TAKE 1 TABLET BY MOUTH EVERY 7 DAYS WITH A FULL GLASS OF WATER ON AN EMPTY STOMACH, Disp: 4 tablet, Rfl: 11   ALPRAZolam  (XANAX ) 0.5 MG tablet, Take 1 tablet (0.5 mg total) by mouth at bedtime as needed. (Patient taking differently: Take 0.5 mg by mouth 3 (three) times daily.), Disp: 30 tablet, Rfl: 0   budesonide -formoterol  (SYMBICORT ) 160-4.5 MCG/ACT inhaler, Inhale 2 puffs into the lungs 2 (two) times daily., Disp: 1 Inhaler, Rfl: 11   Calcium Carbonate (CALCIUM 500 PO), Take 500 Units by mouth 3 (three) times daily., Disp: , Rfl:    Cholecalciferol (VITAMIN D ) 2000 UNITS tablet, Take 4,000 Units by mouth daily. , Disp: , Rfl:    Cranberry 300 MG tablet, Take 300 mg by mouth 2 (two) times daily., Disp: , Rfl:    dextromethorphan-guaiFENesin (MUCINEX DM) 30-600 MG 12hr tablet, Take 1 tablet by mouth daily. , Disp: , Rfl:     ipratropium-albuterol  (DUONEB) 0.5-2.5 (3) MG/3ML SOLN, Inhale 1 vial via nebulizer 3 times a day, Disp: 360 mL, Rfl: 11   Multiple Vitamin (MULTIVITAMIN) tablet, Take 1 tablet by mouth daily., Disp: , Rfl:    PROAIR  HFA 108 (90 Base) MCG/ACT inhaler, Inhale 1-2 puffs into the lungs every 6 (six) hours as needed for wheezing or shortness of breath., Disp: 2 Inhaler, Rfl: 11   Respiratory Therapy Supplies (FLUTTER) DEVI, Use as directed, Disp: 1 each, Rfl: 0   Spacer/Aero-Holding Chambers (AEROCHAMBER MV) inhaler, Use as instructed, Disp: 1 each, Rfl: 0   spironolactone (ALDACTONE) 50 MG tablet, Take 50 mg by mouth daily., Disp: , Rfl:    tiotropium (SPIRIVA  HANDIHALER) 18 MCG inhalation capsule, Place 1 capsule (18 mcg total) into inhaler and inhale daily., Disp: 30 capsule, Rfl:   I have spent a total time of 30-minutes on the day of the appointment including chart review, data review, collecting history, coordinating care and discussing medical diagnosis and plan with the patient/family. Past medical history, allergies, medications were reviewed. Pertinent imaging, labs and tests included in this note have been reviewed and interpreted independently by me.  Return in about 6  months (around 07/07/2024).   Genetta Kenning, MD [facility] Pulmonary/Critical Care Medicine 01/06/2024 1:04 PM

## 2024-01-06 NOTE — Patient Instructions (Signed)
 Very severe COPD --CONTINUE Symbicort  160-4.5 TWO puffs TWICE a day --CONTINUE Spiriva  handinhaler ONE puff ONCE a day --CONTINUE Duonebs three times daily and as needed for shortness of breath --REFER to pulmonary rehab  Chronic hypoxemic respiratory failure --CONTINUE oxygen  to 4L continuous with activity and sleep --Goal SpO2 >88% --Due for re-certification 10/2024

## 2024-01-09 ENCOUNTER — Telehealth (HOSPITAL_COMMUNITY): Payer: Self-pay

## 2024-01-09 NOTE — Telephone Encounter (Signed)
 Pt insurance is active and benefits verified through [facility] Medicare. Co-pay $15, DED $0/$0 met, out of pocket $3,900/$20.30 met, co-insurance 0%. No pre-authorization required. 01/09/2024 @ 9:02am, spoke with Clydell Darnel., REF# [id number].

## 2024-01-09 NOTE — Telephone Encounter (Signed)
 Called patient regarding Pulmonary Rehab. Patient stated they are interested but need to arrange transportation for Tuesdays and would call us  back within the next 3 weeks to get scheduled.

## 2024-02-03 ENCOUNTER — Telehealth (HOSPITAL_COMMUNITY): Payer: Self-pay

## 2024-02-03 NOTE — Telephone Encounter (Signed)
 Follow-up call with patient regarding pulmonary rehab. Patient states she is currently dealing with personal issues and would like us  to call in 3x weeks to schedule. Will follow up again on 6/13.

## 2024-02-29 ENCOUNTER — Telehealth (HOSPITAL_COMMUNITY): Payer: Self-pay

## 2024-02-29 NOTE — Telephone Encounter (Signed)
 Called patient to schedule them for pulmonary rehab. Patient states she is working out transportation with her daughter as she will have to drive her, but tentatively would like to be in the 10:15 class. Patient would like us  to follow up with her in 3 weeks, and she has our number if she would like to call and schedule before then.

## 2024-03-23 ENCOUNTER — Telehealth (HOSPITAL_COMMUNITY): Payer: Self-pay

## 2024-03-23 NOTE — Telephone Encounter (Signed)
 Pt will call back in 3 weeks for pulmonary rehab.   Closed referral.

## 2024-05-02 ENCOUNTER — Telehealth (HOSPITAL_BASED_OUTPATIENT_CLINIC_OR_DEPARTMENT_OTHER): Payer: Self-pay

## 2024-05-02 NOTE — Telephone Encounter (Signed)
 CMN received for oxygen  signed by provider and faxed confirmation received

## 2024-05-07 ENCOUNTER — Encounter (HOSPITAL_BASED_OUTPATIENT_CLINIC_OR_DEPARTMENT_OTHER): Payer: Self-pay | Admitting: Pulmonary Disease

## 2024-08-23 ENCOUNTER — Ambulatory Visit (HOSPITAL_BASED_OUTPATIENT_CLINIC_OR_DEPARTMENT_OTHER): Admitting: Pulmonary Disease

## 2024-08-23 ENCOUNTER — Encounter (HOSPITAL_BASED_OUTPATIENT_CLINIC_OR_DEPARTMENT_OTHER): Payer: Self-pay | Admitting: Pulmonary Disease

## 2024-08-23 VITALS — BP 128/57 | HR 110 | Ht 67.0 in | Wt 145.0 lb

## 2024-08-23 DIAGNOSIS — J9611 Chronic respiratory failure with hypoxia: Secondary | ICD-10-CM | POA: Diagnosis not present

## 2024-08-23 DIAGNOSIS — J449 Chronic obstructive pulmonary disease, unspecified: Secondary | ICD-10-CM

## 2024-08-23 DIAGNOSIS — R29898 Other symptoms and signs involving the musculoskeletal system: Secondary | ICD-10-CM

## 2024-08-23 NOTE — Progress Notes (Addendum)
 Subjective:    Patient ID: Rachel Perez, female    DOB: May 15, 1941, 83 y.o.   MRN: [id number]  Synopsis: Former patient of Dr. Alaine who has COPD.   HPI Chief Complaint  Patient presents with   COPD   Rachel Perez is a 83 year old female with severe emphysema who presents for follow-up.  2023 - Followed for COPD. On Symbicort  and Spiriva . On nocturnal oxygen  2024 - Worsening deconditioning with weakness and fatigue and enrolled in home PT. Worsening hypoxemia on exertion. No exacerbation 2025 - On Symbicort , Spiriva  and scheduled Duonebs and 4L oxygen  with activity and sleep. No exacerbations  08/23/24 Since our last visit she was referred to pulmonary rehab but has declined to schedule as she did not understand the benefit. Reports she has very poor baseline with dyspnea and fatigue. Difficulty walking short distances. Planning to go to assisted living center. Family asking about portable continuous devices.  Past Medical History:  Diagnosis Date   Anxiety    Arthritis    Cataract    COPD (chronic obstructive pulmonary disease) (HCC)    Depression    Emphysema of lung (HCC)    H/O bladder infections    Osteoporosis    Thyroid  disease    Review of Systems  Constitutional:  Negative for chills, diaphoresis and fever.  HENT:  Negative for congestion.   Respiratory:  Positive for shortness of breath. Negative for cough and wheezing.   Cardiovascular:  Positive for leg swelling. Negative for chest pain and palpitations.      Objective:    Vitals:   08/23/24 1101  BP: (!) 128/57  Pulse: (!) 110  SpO2: 98%  Weight: 145 lb (65.8 kg)  Height: 5' 7 (1.702 m)    Physical Exam: General: Well-appearing, no acute distress HENT: [place], AT Eyes: EOMI, no scleral icterus Respiratory: Diminished but cear to auscultation bilaterally.  No crackles, wheezing or rales Cardiovascular: Mild tachycardia, RR, -M/R/G, no JVD Extremities:-Edema,-tenderness Neuro: AAO  x4, CNII-XII grossly intact Psych: Normal mood, normal affect  Interim Data:  Chest imaging: CXR 01/21/20 - Hyperinflation. Chronic coarse interstitial markings  PFT 07/17/2015 FVC 2.45  (74%) FEV1 0.75 (29%) Ratio 36  TLC 192% DLCO 30%. Significant BD response and air trapping. Consistent with emphysema Interpretation: Very severe emphysema  Sleep study: Sleep study in November 2017 showed nocturnal hypoxemia (82% on 2L) but no evidence of obstructive sleep apnea.     Assessment & Plan:   Discussion: 83 year old female with very severe COPD, chronic hypoxemic respiratory failure who presents for follow-up. Discussed clinical course and management of COPD/asthma including bronchodilator regimen, preventive care including vaccinations and action plan for exacerbation.  Reviewed devices that family inquired about two continuous portable oxygen  devices. Unfortunately only capable for up to 2L continuous. Patient would not be able to utilize these machines.  Very severe COPD - slowly progressive. Stable, persistent symptoms.  --CONTINUE Symbicort  160-4.5 TWO puffs TWICE a day --CONTINUE Spiriva  handinhaler ONE puff ONCE a day --CONTINUE Duonebs three times daily and as needed for shortness of breath  Chronic hypoxemic respiratory failure --Recertified 08/23/24. Not qualified for pulse O2 --CONTINUE oxygen  for 4L continuous with activity and sleep --Goal SpO2 >88% --When traveling, call your DME to pick-up/delivery to where you are staying  Deconditioning - worsening --Encourage frequent activity --Please call back Pulmonary rehab to start therapy  Current Outpatient Medications:    acidophilus (RISAQUAD) CAPS capsule, Take 1 capsule by mouth daily.,  Disp: , Rfl:    alendronate  (FOSAMAX ) 70 MG tablet, TAKE 1 TABLET BY MOUTH EVERY 7 DAYS WITH A FULL GLASS OF WATER ON AN EMPTY STOMACH, Disp: 4 tablet, Rfl: 11   ALPRAZolam  (XANAX ) 0.5 MG tablet, Take 1 tablet (0.5 mg total) by mouth  at bedtime as needed. (Patient taking differently: Take 0.5 mg by mouth 3 (three) times daily.), Disp: 30 tablet, Rfl: 0   budesonide -formoterol  (SYMBICORT ) 160-4.5 MCG/ACT inhaler, Inhale 2 puffs into the lungs 2 (two) times daily., Disp: 1 Inhaler, Rfl: 11   Calcium Carbonate (CALCIUM 500 PO), Take 500 Units by mouth 3 (three) times daily., Disp: , Rfl:    Cholecalciferol (VITAMIN D ) 2000 UNITS tablet, Take 4,000 Units by mouth daily. , Disp: , Rfl:    cholecalciferol (VITAMIN D3) 25 MCG (1000 UNIT) tablet, Take 1,200 Units by mouth daily., Disp: , Rfl:    Cranberry 300 MG tablet, Take 300 mg by mouth 2 (two) times daily., Disp: , Rfl:    denosumab (PROLIA) 60 MG/ML SOSY injection, Inject 60 mg into the skin every 6 (six) months., Disp: , Rfl:    dextromethorphan-guaiFENesin (MUCINEX DM) 30-600 MG 12hr tablet, Take 1 tablet by mouth daily. , Disp: , Rfl:    ipratropium-albuterol  (DUONEB) 0.5-2.5 (3) MG/3ML SOLN, Inhale 1 vial via nebulizer 3 times a day, Disp: 360 mL, Rfl: 11   Multiple Vitamin (MULTIVITAMIN) tablet, Take 1 tablet by mouth daily., Disp: , Rfl:    mupirocin cream (BACTROBAN) 2 %, Apply 1 Application topically 2 (two) times daily., Disp: , Rfl:    PROAIR  HFA 108 (90 Base) MCG/ACT inhaler, Inhale 1-2 puffs into the lungs every 6 (six) hours as needed for wheezing or shortness of breath., Disp: 2 Inhaler, Rfl: 11   Respiratory Therapy Supplies (FLUTTER) DEVI, Use as directed, Disp: 1 each, Rfl: 0   Spacer/Aero-Holding Chambers (AEROCHAMBER MV) inhaler, Use as instructed, Disp: 1 each, Rfl: 0   spironolactone (ALDACTONE) 50 MG tablet, Take 50 mg by mouth daily., Disp: , Rfl:    tiotropium (SPIRIVA  HANDIHALER) 18 MCG inhalation capsule, Place 1 capsule (18 mcg total) into inhaler and inhale daily., Disp: 30 capsule, Rfl:    triamcinolone  cream (KENALOG ) 0.1 %, Apply 1 Application topically 2 (two) times daily., Disp: , Rfl:   I have spent a total time of 30-minutes on the day of the  appointment including chart review, data review, collecting history, coordinating care and discussing medical diagnosis and plan with the patient/family. Past medical history, allergies, medications were reviewed. Pertinent imaging, labs and tests included in this note have been reviewed and interpreted independently by me.  Return in about 3 months (around 11/21/2024).   Slater Staff, MD [facility] Pulmonary/Critical Care Medicine 08/23/2024 11:11 AM

## 2024-08-23 NOTE — Patient Instructions (Signed)
 Very severe COPD --CONTINUE Symbicort  160-4.5 TWO puffs TWICE a day --CONTINUE Spiriva  handinhaler ONE puff ONCE a day --CONTINUE Duonebs three times daily and as needed for shortness of breath --Please call back Pulmonary rehab to start therapy  Chronic hypoxemic respiratory failure --Recertified 08/23/24. Not qualified for pulse O2 --CONTINUE oxygen  for 4L continuous with activity and sleep --Goal SpO2 >88% --When traveling, call your DME to pick-up/delivery to where you are staying

## 2024-09-25 NOTE — Progress Notes (Signed)
 " Medication Management with Clinical Pharmacist Practitioner [facility] [place] [address] [place] [address] Dept: [phone] Dept Fax: [phone]   Patient has been advised as to the limitations and limited nature of physical exam due to nature of a telephone visit, the possibility of privacy risk in the use of a telephone visit, and that the healthcare provider may recommend visiting a healthcare clinic for in-person care and follow up. I have spent the following amount of time in direct patient care for this encounter and in coordination of care: Established Patient - 20 minutes   Referral to [facility] Medication Management placed on 12/23/23 for    by Allen Domino, PA  Date of last OV:  04/24/24 with [facility]PP and 04/13/24 with PCP Visit was conducted by (telephone/video visit): Telephone   Rachel Perez is a 84 y.o. female who presents today for ongoing visit for Osteoporosis .  Patient has been advised as to the limitations and limited nature of physical exam due to nature of a telephone visit, the possibility of privacy risk in the use of a telephone visit, and that the healthcare provider may recommend visiting a healthcare clinic for in-person care and follow up.  Assessment & Plan  ______________________________________________________________________ 1. Age-related osteoporosis without current pathological fracture (Primary) -     denosumab-bbdz (JUBBONTI) 60 mg/mL injection; Inject sixty mg into the skin once for 1 dose., Starting Tue 09/25/2024, Normal  Based on DXA, patient has osteoporosis and does meet National Osteoporosis Foundation criteria for medication. I have discussed several evidence based best practice treatment options in detail with the patient, including Prolia. First dose: 04/13/24, next dose due: 10/14/24. Already has nurse visit scheduled for 2/2. Resent RX today for preferred Prolia generic per her insurance.  She does not have any contraindications  to use. Vitamin D  and Ca are WNL (Vit D = 71.3 on 10/06/23 and Ca =10 on 07/27/24) and is on appropriate supplementation for both.  Next DXA due February 2027.  Plan: Continue: Prolia 60 mg Q 6 months, vitamin D  5000 IU PO QD, calcium 666 mg PO BID  Follow up with CPP: 5 months Future Appointments  Date Time Provider Department Center  10/15/2024  1:30 PM [facility] NURSE [facility]  12/11/2024  1:00 PM Domino Allen, PA [facility]    Length of encounter with pharmacist: 20 min.   Greater than 50% of the visit was spent on counseling or coordinating care regarding the following disease states:  Osteoporosis Risks, benefits, and alternatives of the medications and treatment plan prescribed today were discussed, and patient expressed understanding. Plan follow-up as discussed or as needed if any worsening symptoms or change in condition.  Subjective ______________________________________________________________________ HISTORY OF PRESENT ILLNESS: This is a 84 y.o. female who is seen in consultation at the request of  [facility] for a bone health evaluation for primary fracture prevention and medication management recommendations.    Treatment history: Currently treated with Prolia. Denies any adverse effects such as infection or ONJ/AFF.  Potentially modifiable risk factors:  Calcium: Yes 600 mg BID  Vitamin D :  Yes 5000 IU daily  Smoking: no, former  Steroid use:  no   HRT use: no  PPI use: no   Other history: Prior fragility fracture: no Recent falls: no   Pain in back, hip, thigh or groin pain: no Goes to dentist: no - stopped going since lost on all her teeth.  Recommended she see a  dentist again.  Counseled on the following non-pharmacologic treatments: Appropriate calcium 1,200-1,500 mg daily and vitamin D  [phone] IU daily intake Weight bearing exercise as tolerated, muscle strengthening & balance activities Fall prevention & personal safety tips Smoking and  alcohol cessation Emphasized importance of regular dental visits  Medication Access: Is it difficult to pay for medications? YES Prolia [facility] Assessment of Medication Adherence- the patient : denies missed doses of medications Barriers of care: None  Objective ______________________________________________________________________ Last DXA (11/11/23):   T-score L1-L2   -3.9   T-score left femoral neck -3.4   T-score right femoral neck -3.6   LABORATORIES:  Lab Results  Component Value Date   Creatinine 0.91 07/27/2024   Lab Results  Component Value Date   eGFR 63 07/27/2024   Lab Results  Component Value Date   CALCIUM 10.0 07/27/2024   Lab Results  Component Value Date   Vit D, 25-Hydroxy 71.3 10/06/2023   Portions of this note was dictated using voice recognition software. I have carefully reviewed this note and despite this, minor syntax, contextual, or spelling errors may be present. Despite my review, errors may be inadvertently overlooked and are related to this software and are not intentional. Please reach out if you have any questions or clarification is needed.  Jon Levan, PharmD, BCGP, CPP Clinical Pharmacist "

## 2024-12-12 ENCOUNTER — Ambulatory Visit (HOSPITAL_BASED_OUTPATIENT_CLINIC_OR_DEPARTMENT_OTHER): Admitting: Pulmonary Disease
